# Patient Record
Sex: Female | Born: 1975 | Race: White | Hispanic: No | Marital: Single | Smoking: Never smoker
Health system: Southern US, Community
[De-identification: ages and names within clinical notes are randomized; demographics above are authoritative.]

## PROBLEM LIST (undated history)

## (undated) DIAGNOSIS — G43909 Migraine, unspecified, not intractable, without status migrainosus: Secondary | ICD-10-CM

## (undated) DIAGNOSIS — H539 Unspecified visual disturbance: Secondary | ICD-10-CM

## (undated) DIAGNOSIS — K219 Gastro-esophageal reflux disease without esophagitis: Secondary | ICD-10-CM

## (undated) DIAGNOSIS — J329 Chronic sinusitis, unspecified: Secondary | ICD-10-CM

## (undated) DIAGNOSIS — K589 Irritable bowel syndrome without diarrhea: Secondary | ICD-10-CM

## (undated) DIAGNOSIS — G35 Multiple sclerosis: Secondary | ICD-10-CM

## (undated) DIAGNOSIS — F32A Depression, unspecified: Secondary | ICD-10-CM

## (undated) DIAGNOSIS — F329 Major depressive disorder, single episode, unspecified: Secondary | ICD-10-CM

## (undated) DIAGNOSIS — G35D Multiple sclerosis, unspecified: Secondary | ICD-10-CM

## (undated) DIAGNOSIS — F419 Anxiety disorder, unspecified: Secondary | ICD-10-CM

## (undated) DIAGNOSIS — K8020 Calculus of gallbladder without cholecystitis without obstruction: Secondary | ICD-10-CM

## (undated) HISTORY — DX: Unspecified visual disturbance: H53.9

## (undated) HISTORY — PX: NO PAST SURGERIES: SHX2092

## (undated) HISTORY — PX: COLONOSCOPY: SHX174

## (undated) NOTE — *Deleted (*Deleted)
Below is our plan:  We will continue dimethyl fumerate as prescribed.   Please make sure you are staying well hydrated. I recommend 50-60 ounces daily. Well balanced diet and regular exercise encouraged.    Please continue follow up with care team as directed.   Follow up with Dr Epimenio Foot in 6 months   You may receive a survey regarding today's visit. I encourage you to leave honest feed back as I do use this information to improve patient care. Thank you for seeing me today!     Multiple Sclerosis Multiple sclerosis (MS) is a disease of the brain, spinal cord, and optic nerves (central nervous system). It causes the body's disease-fighting (immune) system to destroy the protective covering (myelin sheath) around nerves in the brain. When this happens, signals (nerve impulses) going to and from the brain and spinal cord do not get sent properly or may not get sent at all. There are several types of MS:  Relapsing-remitting MS. This is the most common type. This causes sudden attacks of symptoms. After an attack, you may recover completely until the next attack, or some symptoms may remain permanently.  Secondary progressive MS. This usually develops after the onset of relapsing-remitting MS. Similar to relapsing-remitting MS, this type also causes sudden attacks of symptoms. Attacks may be less frequent, but symptoms slowly get worse (progress) over time.  Primary progressive MS. This causes symptoms that steadily progress over time. This type of MS does not cause sudden attacks of symptoms. The age of onset of MS varies, but it often develops between 70-10 years of age. MS is a lifelong (chronic) condition. There is no cure, but treatment can help slow down the progression of the disease. What are the causes? The cause of this condition is not known. What increases the risk? You are more likely to develop this condition if:  You are a woman.  You have a relative with MS. However, the  condition is not passed from parent to child (inherited).  You have a lack (deficiency) of vitamin D.  You smoke. MS is more common in the [place] than in the [place]. What are the signs or symptoms? Relapsing-remitting and secondary progressive MS cause symptoms to occur in episodes or attacks that may last weeks to months. There may be long periods between attacks in which there are almost no symptoms. Primary progressive MS causes symptoms to steadily progress after they develop. Symptoms of MS vary because of the many different ways it affects the central nervous system. The main symptoms include:  Vision problems and eye pain.  Numbness.  Weakness.  Inability to move your arms, hands, feet, or legs (paralysis).  Balance problems.  Shaking that you cannot control (tremors).  Muscle spasms.  Problems with thinking (cognitive changes). MS can also cause symptoms that are associated with the disease, but are not always the direct result of an MS attack. They may include:  Inability to control urination or bowel movements (incontinence).  Headaches.  Fatigue.  Inability to tolerate heat.  Emotional changes.  Depression.  Pain. How is this diagnosed? This condition is diagnosed based on:  Your symptoms.  A neurological exam. This involves checking central nervous system function, such as nerve function, reflexes, and coordination.  MRIs of the brain and spinal cord.  Lab tests, including a lumbar puncture that tests the fluid that surrounds the brain and spinal cord (cerebrospinal fluid).  Tests to measure the electrical activity of the brain  in response to stimulation (evoked potentials). How is this treated? There is no cure for MS, but medicines can help decrease the number and frequency of attacks and help relieve nuisance symptoms. Treatment options may include:  Medicines that reduce the frequency of attacks. These medicines  may be given by injection, by mouth (orally), or through an IV.  Medicines that reduce inflammation (steroids). These may provide short-term relief of symptoms.  Medicines to help control pain, depression, fatigue, or incontinence.  Vitamin D, if you have a deficiency.  Using devices to help you move around (assistive devices), such as braces, a cane, or a walker.  Physical therapy to strengthen and stretch your muscles.  Occupational therapy to help you with everyday tasks.  Alternative or complementary treatments such as exercise, massage, or acupuncture. Follow these instructions at home:  Take over-the-counter and prescription medicines only as told by your health care provider.  Do not drive or use heavy machinery while taking prescription pain medicine.  Use assistive devices as recommended by your physical therapist or your health care provider.  Exercise as directed by your health care provider.  Return to your normal activities as told by your health care provider. Ask your health care provider what activities are safe for you.  Reach out for support. Share your feelings with friends, family, or a support group.  Keep all follow-up visits as told by your health care provider and therapists. This is important. Where to find more information  National Multiple Sclerosis Society: https://www.nationalmssociety.org Contact a health care provider if:  You feel depressed.  You develop new pain or numbness.  You have tremors.  You have problems with sexual function. Get help right away if:  You develop paralysis.  You develop numbness.  You have problems with your bladder or bowel function.  You develop double vision.  You lose vision in one or both eyes.  You develop suicidal thoughts.  You develop severe confusion. If you ever feel like you may hurt yourself or others, or have thoughts about taking your own life, get help right away. You can go to your  nearest emergency department or call:  Your local emergency services (911 in the [place]  A suicide crisis helpline, such as the National Suicide Prevention Lifeline at 270 625 7581. This is open 24 hours a day. Summary  Multiple sclerosis (MS) is a disease of the central nervous system that causes the body's immune system to destroy the protective covering (myelin sheath) around nerves in the brain.  There are 3 types of MS: relapsing-remitting, secondary progressive, and primary progressive. Relapsing-remitting and secondary progressive MS cause symptoms to occur in episodes or attacks that may last weeks to months. Primary progressive MS causes symptoms to steadily progress after they develop.  There is no cure for MS, but medicines can help decrease the number and frequency of attacks and help relieve nuisance symptoms. Treatment may also include physical or occupational therapy.  If you develop numbness, paralysis, vision problems, or other neurological symptoms, get help right away. This information is not intended to replace advice given to you by your health care provider. Make sure you discuss any questions you have with your health care provider. Document Revised: 08/18/2017 Document Reviewed: 11/14/2016 Elsevier Patient Education  2020 [employer].

---

## 2002-03-01 ENCOUNTER — Inpatient Hospital Stay (HOSPITAL_COMMUNITY): Admission: AD | Admit: 2002-03-01 | Discharge: 2002-03-01 | Payer: Self-pay | Admitting: Obstetrics & Gynecology

## 2002-03-05 ENCOUNTER — Inpatient Hospital Stay (HOSPITAL_COMMUNITY): Admission: AD | Admit: 2002-03-05 | Discharge: 2002-03-05 | Payer: Self-pay | Admitting: Obstetrics and Gynecology

## 2002-03-07 ENCOUNTER — Inpatient Hospital Stay (HOSPITAL_COMMUNITY): Admission: AD | Admit: 2002-03-07 | Discharge: 2002-03-09 | Payer: Self-pay | Admitting: Obstetrics and Gynecology

## 2002-04-10 ENCOUNTER — Other Ambulatory Visit: Admission: RE | Admit: 2002-04-10 | Discharge: 2002-04-10 | Payer: Self-pay | Admitting: Obstetrics and Gynecology

## 2004-05-06 ENCOUNTER — Other Ambulatory Visit: Admission: RE | Admit: 2004-05-06 | Discharge: 2004-05-06 | Payer: Self-pay | Admitting: Obstetrics and Gynecology

## 2005-07-14 ENCOUNTER — Other Ambulatory Visit: Admission: RE | Admit: 2005-07-14 | Discharge: 2005-07-14 | Payer: Self-pay | Admitting: Gynecology

## 2007-04-10 ENCOUNTER — Encounter: Admission: RE | Admit: 2007-04-10 | Discharge: 2007-04-10 | Payer: Self-pay | Admitting: Family Medicine

## 2010-10-10 ENCOUNTER — Encounter: Payer: Self-pay | Admitting: Family Medicine

## 2012-05-30 ENCOUNTER — Ambulatory Visit
Admission: RE | Admit: 2012-05-30 | Discharge: 2012-05-30 | Disposition: A | Discharge status: Home / Self Care | Payer: Managed Care, Other (non HMO) | Source: Ambulatory Visit | Attending: Family Medicine | Admitting: Family Medicine

## 2012-05-30 ENCOUNTER — Other Ambulatory Visit: Payer: Self-pay | Admitting: Family Medicine

## 2012-05-30 DIAGNOSIS — M545 Low back pain, unspecified: Secondary | ICD-10-CM

## 2012-05-30 DIAGNOSIS — R200 Anesthesia of skin: Secondary | ICD-10-CM

## 2013-06-05 ENCOUNTER — Other Ambulatory Visit: Payer: Self-pay | Admitting: Family Medicine

## 2013-06-05 DIAGNOSIS — G8929 Other chronic pain: Secondary | ICD-10-CM

## 2013-06-10 ENCOUNTER — Other Ambulatory Visit: Payer: Self-pay | Admitting: Family Medicine

## 2013-06-10 DIAGNOSIS — R1011 Right upper quadrant pain: Secondary | ICD-10-CM

## 2013-06-13 ENCOUNTER — Ambulatory Visit
Admission: RE | Admit: 2013-06-13 | Discharge: 2013-06-13 | Disposition: A | Discharge status: Home / Self Care | Payer: Managed Care, Other (non HMO) | Source: Ambulatory Visit | Attending: Family Medicine | Admitting: Family Medicine

## 2013-06-13 DIAGNOSIS — R1011 Right upper quadrant pain: Secondary | ICD-10-CM

## 2013-06-16 ENCOUNTER — Ambulatory Visit
Admission: RE | Admit: 2013-06-16 | Discharge: 2013-06-16 | Disposition: A | Discharge status: Home / Self Care | Payer: Managed Care, Other (non HMO) | Source: Ambulatory Visit | Attending: Family Medicine | Admitting: Family Medicine

## 2013-06-16 DIAGNOSIS — G8929 Other chronic pain: Secondary | ICD-10-CM

## 2013-06-16 MED ORDER — GADOBENATE DIMEGLUMINE 529 MG/ML IV SOLN
20.0000 mL | Freq: Once | INTRAVENOUS | Status: AC | PRN
  Administered 2013-06-16: 20 mL via INTRAVENOUS

## 2013-06-17 ENCOUNTER — Ambulatory Visit (INDEPENDENT_AMBULATORY_CARE_PROVIDER_SITE_OTHER): Payer: Managed Care, Other (non HMO) | Admitting: Surgery

## 2013-06-17 ENCOUNTER — Encounter (INDEPENDENT_AMBULATORY_CARE_PROVIDER_SITE_OTHER): Payer: Self-pay | Admitting: Surgery

## 2013-06-17 VITALS — BP 122/70 | HR 60 | Temp 98.2°F | Resp 14 | Ht 70.5 in | Wt 283.2 lb

## 2013-06-17 DIAGNOSIS — K8020 Calculus of gallbladder without cholecystitis without obstruction: Secondary | ICD-10-CM | POA: Insufficient documentation

## 2013-06-17 NOTE — Progress Notes (Signed)
Patient ID: Kristen Brown, female   DOB: [birth year], 37 y.o.   MRN: [id number]  Chief Complaint  Patient presents with  . New Evaluation    eval GB/gallstones    HPI Kristen Brown is a 37 y.o. female.  Patient sent at request of Dr. Caryn Bee little or upper quadrant pain. This has been intermittent and the patient is right upper quadrant. Made worse with eating fatty greasy foods. Duration is minutes to hours. Intensity severe. Associated with nausea and vomiting. Pain radiates and right chest. Pain made better by avoiding greasy foods. Ultrasound shows gallstones. HPI  History reviewed. No pertinent past medical history.  History reviewed. No pertinent past surgical history.  Family History  Problem Relation Age of Onset  . Hyperlipidemia Mother   . Hyperlipidemia Father   . Diabetes Father   . Cancer Paternal Grandfather     colon    Social History History  Substance Use Topics  . Smoking status: Never Smoker   . Smokeless tobacco: Never Used  . Alcohol Use: No    Allergies  Allergen Reactions  . Sulfa Antibiotics Hives and Itching    No current outpatient prescriptions on file.   No current facility-administered medications for this visit.    Review of Systems Review of Systems  Constitutional: Negative for fever, chills and unexpected weight change.  HENT: Negative for hearing loss, congestion, sore throat, trouble swallowing and voice change.   Eyes: Negative for visual disturbance.  Respiratory: Negative for cough and wheezing.   Cardiovascular: Negative for chest pain, palpitations and leg swelling.  Gastrointestinal: Positive for abdominal pain. Negative for nausea, vomiting, diarrhea, constipation, blood in stool, abdominal distention and anal bleeding.  Genitourinary: Negative for hematuria, vaginal bleeding and difficulty urinating.  Musculoskeletal: Negative for arthralgias.  Skin: Negative for rash and wound.  Neurological: Negative for seizures, syncope  and headaches.  Hematological: Negative for adenopathy. Does not bruise/bleed easily.  Psychiatric/Behavioral: Negative for confusion.    Blood pressure 122/70, pulse 60, temperature 98.2 F (36.8 C), temperature source Temporal, resp. rate 14, height 5' 10.5" (1.791 m), weight 283 lb 3.2 oz (128.459 kg).  Physical Exam Physical Exam  Constitutional: She is oriented to person, place, and time. She appears well-developed and well-nourished.  HENT:  Head: Normocephalic and atraumatic.  Eyes: EOM are normal. Pupils are equal, round, and reactive to light.  Neck: Normal range of motion. Neck supple.  Cardiovascular: Normal rate and regular rhythm.   Pulmonary/Chest: Effort normal and breath sounds normal.  Abdominal: Soft. Bowel sounds are normal. She exhibits no distension. There is no tenderness. There is no rebound.  Musculoskeletal: Normal range of motion.  Neurological: She is alert and oriented to person, place, and time.  Skin: Skin is warm and dry.  Psychiatric: She has a normal mood and affect. Her behavior is normal. Judgment and thought content normal.    Data Reviewed CLINICAL DATA: Right upper quadrant abdominal pain and nausea for 1  week  EXAM:  ABDOMEN ULTRASOUND  COMPARISON: None  FINDINGS:  Gallbladder  Multiple shadowing calculi within gallbladder up to at least 13 mm  in size, some present at gallbladder neck. No gallbladder wall  thickening, pericholecystic fluid or sonographic Murphy sign.  Common bile duct  Diameter: 6 mm diameter common upper normal  Liver  Borderline increased echogenicity. No focal mass lesion.  IVC  Normal appearance  Pancreas  Normal appearance  Spleen  Normal appearance, 8.0 cm length  Right Kidney  Length: 12.0 cm length.  Normal morphology without mass or  hydronephrosis.  Left Kidney  Length: 12.5 cm length. Normal morphology without mass or  hydronephrosis.  Abdominal aorta  Bifurcation obscured by bowel gas. Visualized  portion normal  caliber.  No free-fluid.  IMPRESSION:  Cholelithiasis without evidence acute cholecystitis.  Electronically Signed  By: Ulyses Southward M.D.  On: 06/13/2013 08:21   Assessment    Symptomatic cholelithiasis    Plan    Recommend laparoscopic cholecystectomy. She will call and arrange when her schedule permits. The procedure has been discussed with the patient. Operative and non operative treatments have been discussed. Risks of surgery include bleeding, infection,  Common bile duct injury,  Injury to the stomach,liver, colon,small intestine, abdominal wall,  Diaphragm,  Major blood vessels,  And the need for an open procedure.  Other risks include worsening of medical problems, death,  DVT and pulmonary embolism, and cardiovascular events.   Medical options have also been discussed. The patient has been informed of long term expectations of surgery and non surgical options,  The patient agrees to proceed.         Declin Rajan A. 06/17/2013, 1:58 PM

## 2013-06-17 NOTE — Patient Instructions (Signed)

## 2014-07-20 DIAGNOSIS — K8020 Calculus of gallbladder without cholecystitis without obstruction: Secondary | ICD-10-CM

## 2014-07-20 HISTORY — DX: Calculus of gallbladder without cholecystitis without obstruction: K80.20

## 2014-07-29 ENCOUNTER — Ambulatory Visit (INDEPENDENT_AMBULATORY_CARE_PROVIDER_SITE_OTHER): Payer: Self-pay | Admitting: Surgery

## 2014-07-29 NOTE — H&P (Signed)
Kristen Brown 07/28/2014 3:15 PM Location: [facility] Patient #: [id number] DOB: 1976-03-15 Single / Language: [place] / Race: White Female History of Present Illness Kristen Brown(Maranatha Grossi A. [name] MD; 07/29/2014 7:17 AM) Patient words: gallbaldder   intermittent sharp RUQ abdominal pain after fatty meals for 3 months. Getting worse slowly. Associated nausea and vomiting with fatty foods. Better with low fat diet.                CLINICAL DATA: Right upper quadrant abdominal pain and nausea for 1 week  EXAM: ABDOMEN ULTRASOUND  COMPARISON: None  FINDINGS: Gallbladder  Multiple shadowing calculi within gallbladder up to at least 13 mm in size, some present at gallbladder neck. No gallbladder wall thickening, pericholecystic fluid or sonographic Murphy sign.  Common bile duct  Diameter: 6 mm diameter common upper normal  Liver  Borderline increased echogenicity. No focal mass lesion.  IVC  Normal appearance  Pancreas  Normal appearance  Spleen  Normal appearance, 8.0 cm length  Right Kidney  Length: 12.0 cm length. Normal morphology without mass or hydronephrosis.  Left Kidney  Length: 12.5 cm length. Normal morphology without mass or hydronephrosis.  Abdominal aorta  Bifurcation obscured by bowel gas. Visualized portion normal caliber.  No free-fluid.  IMPRESSION: Cholelithiasis without evidence acute cholecystitis.   Electronically Signed By: Ulyses SouthwardMark Boles M.D. On: 06/13/2013 08:21.  The patient is a 38 year old female who presents for evaluation of gall stones. The onset of the gall stones has been acute and has been occurring in a persistent pattern for 3 months. The course has been gradually worsening. The gall stones is described as moderate. There has been associated abdominal pain, colicky pain and nausea. Precipitating factors include eating and excessive fat intake. Relieving factors include dietary changes. Other  Problems Kristen Brown([name], CMA; 07/28/2014 3:17 PM) Anxiety Disorder Cholelithiasis Depression Gastroesophageal Reflux Disease Hemorrhoids Melanoma Migraine Headache  Past Surgical History Kristen Brown([name], CMA; 07/28/2014 3:17 PM) Oral Surgery  Diagnostic Studies History Kristen Brown([name], CMA; 07/28/2014 3:17 PM) Colonoscopy never Mammogram never Pap Smear 1-5 years ago  Allergies Kristen Brown([name], CMA; 07/28/2014 3:18 PM) Sulfa Antibiotics  Medication History ([name], CMA; 07/28/2014 3:19 PM) LORazepam (0.5MG  Tablet, Oral) Active. FLUoxetine HCl (20MG  Tablet, Oral) Active. TriNessa (28) (0.18/0.215/0.25MG -35 MCG Tablet, Oral) Active. Tecfidera (240MG  Capsule DR, Oral) Active. Gabapentin (100MG  Capsule, Oral) Active.  Social History Kristen Brown([name], CMA; 07/28/2014 3:17 PM) Caffeine use Carbonated beverages, Coffee. No alcohol use No drug use Tobacco use Never smoker.  Family History Kristen Brown([name], CMA; 07/28/2014 3:17 PM) Arthritis Father, Mother. Melanoma Father. Migraine Headache Mother.  Pregnancy / Birth History Kristen Brown([name], CMA; 07/28/2014 3:17 PM) Age at menarche 9 years. Contraceptive History Oral contraceptives. Gravida 1 Maternal age 39-25 Para 1 Regular periods     Review of Systems [name]([name] CMA; 07/28/2014 3:17 PM) General Present- Fatigue. Not Present- Appetite Loss, Chills, Fever, Night Sweats, Weight Gain and Weight Loss. HEENT Present- Seasonal Allergies and Sinus Pain. Not Present- Earache, Hearing Loss, Hoarseness, Nose Bleed, Oral Ulcers, Ringing in the Ears, Sore Throat, Visual Disturbances, Wears glasses/contact lenses and Yellow Eyes. Respiratory Not Present- Bloody sputum, Chronic Cough, Difficulty Breathing, Snoring and Wheezing. Breast Not Present- Breast Mass, Breast Pain, Nipple Discharge and Skin Changes. Cardiovascular Not Present- Chest Pain, Difficulty Breathing Lying Down, Leg Cramps, Palpitations, Rapid Heart Rate,  Shortness of Breath and Swelling of Extremities. Gastrointestinal Not Present- Abdominal Pain, Bloating, Bloody Stool, Change in Bowel Habits, Chronic diarrhea, Constipation, Difficulty Swallowing, Excessive gas,  Gets full quickly at meals, Hemorrhoids, Indigestion, Nausea, Rectal Pain and Vomiting. Female Genitourinary Not Present- Frequency, Nocturia, Painful Urination, Pelvic Pain and Urgency. Musculoskeletal Present- Muscle Weakness. Not Present- Back Pain, Joint Pain, Joint Stiffness, Muscle Pain and Swelling of Extremities. Neurological Present- Decreased Memory, Tremor and Weakness. Not Present- Fainting, Headaches, Numbness, Seizures, Tingling and Trouble walking. Psychiatric Present- Anxiety. Not Present- Bipolar, Change in Sleep Pattern, Depression, Fearful and Frequent crying. Endocrine Present- Heat Intolerance. Not Present- Cold Intolerance, Excessive Hunger, Hair Changes, Hot flashes and New Diabetes. Hematology Present- Easy Bruising. Not Present- Excessive bleeding, Gland problems, HIV and Persistent Infections.  Vitals ([name] CMA; 07/28/2014 3:20 PM) 07/28/2014 3:19 PM Weight: 289 lb Height: 71in Body Surface Area: 2.56 m Body Mass Index: 40.31 kg/m Temp.: 98.66F(Temporal)  Pulse: 78 (Regular)  BP: 130/76 (Sitting, Left Arm, Standard)     Physical Exam ([name] A. [name] MD; 07/28/2014 9:21 PM)  General Mental Status-Alert. General Appearance-Consistent with stated age. Hydration-Well hydrated. Voice-Normal.  Head and Neck Head-normocephalic, atraumatic with no lesions or palpable masses.  Eye Eyeball - Bilateral-Extraocular movements intact. Sclera/Conjunctiva - Bilateral-No scleral icterus.  Chest and Lung Exam Chest and lung exam reveals -quiet, even and easy respiratory effort with no use of accessory muscles and on auscultation, normal breath sounds, no adventitious sounds and normal vocal resonance. Inspection Chest Wall -  Normal. Back - normal.  Cardiovascular Cardiovascular examination reveals -on palpation PMI is normal in location and amplitude, no palpable S3 or S4. Normal cardiac borders., normal heart sounds, regular rate and rhythm with no murmurs, carotid auscultation reveals no bruits and normal pedal pulses bilaterally.  Abdomen Inspection Inspection of the abdomen reveals - No Hernias. Skin - Scar - no surgical scars. Palpation/Percussion Palpation and Percussion of the abdomen reveal - Soft, Non Tender, No Rebound tenderness, No Rigidity (guarding) and No hepatosplenomegaly. Auscultation Auscultation of the abdomen reveals - Bowel sounds normal.  Neurologic Neurologic evaluation reveals -alert and oriented x 3 with no impairment of recent or remote memory. Mental Status-Normal.  Musculoskeletal Normal Exam - Left-Upper Extremity Strength Normal and Lower Extremity Strength Normal. Normal Exam - Right-Upper Extremity Strength Normal, Lower Extremity Weakness.    Assessment & Plan (Waldo Damian A. Hatem Cull MD; 07/28/2014 9:23 PM)  SYMPTOMATIC CHOLELITHIASIS (574.20  K80.20) Impression: The procedure has been discussed with the patient. Risks of laparoscopic cholecystectomy include bleeding, infection, bile duct injury, leak, death, open surgery, diarrhea, other surgery, organ injury, blood vessel injury, DVT, and additional care.  RECOMMEND LAPROSCOPIC CHOLECYSTECTOMY AND CHOLANGIOGRAM.  Current Plans Pt Education - Cholecystectomy: Gallbladder Removal with Open Surgery: laparoscopic cholecystectomy

## 2014-08-11 ENCOUNTER — Encounter (HOSPITAL_BASED_OUTPATIENT_CLINIC_OR_DEPARTMENT_OTHER): Payer: Self-pay | Admitting: *Deleted

## 2014-08-11 DIAGNOSIS — J329 Chronic sinusitis, unspecified: Secondary | ICD-10-CM

## 2014-08-11 HISTORY — DX: Chronic sinusitis, unspecified: J32.9

## 2014-08-11 NOTE — Pre-Procedure Instructions (Signed)
To come for CBC, diff, CMET 

## 2014-08-12 ENCOUNTER — Encounter (HOSPITAL_BASED_OUTPATIENT_CLINIC_OR_DEPARTMENT_OTHER)
Admission: RE | Admit: 2014-08-12 | Discharge: 2014-08-12 | Disposition: A | Discharge status: Home / Self Care | Payer: Managed Care, Other (non HMO) | Source: Ambulatory Visit | Attending: Surgery | Admitting: Surgery

## 2014-08-12 DIAGNOSIS — Z01812 Encounter for preprocedural laboratory examination: Secondary | ICD-10-CM | POA: Diagnosis not present

## 2014-08-12 DIAGNOSIS — F418 Other specified anxiety disorders: Secondary | ICD-10-CM | POA: Diagnosis not present

## 2014-08-12 DIAGNOSIS — Z8261 Family history of arthritis: Secondary | ICD-10-CM | POA: Diagnosis not present

## 2014-08-12 DIAGNOSIS — J302 Other seasonal allergic rhinitis: Secondary | ICD-10-CM | POA: Diagnosis not present

## 2014-08-12 DIAGNOSIS — Z809 Family history of malignant neoplasm, unspecified: Secondary | ICD-10-CM | POA: Diagnosis not present

## 2014-08-12 DIAGNOSIS — Z6841 Body Mass Index (BMI) 40.0 and over, adult: Secondary | ICD-10-CM | POA: Diagnosis not present

## 2014-08-12 DIAGNOSIS — Z882 Allergy status to sulfonamides status: Secondary | ICD-10-CM | POA: Diagnosis not present

## 2014-08-12 DIAGNOSIS — K219 Gastro-esophageal reflux disease without esophagitis: Secondary | ICD-10-CM | POA: Diagnosis not present

## 2014-08-12 DIAGNOSIS — K649 Unspecified hemorrhoids: Secondary | ICD-10-CM | POA: Diagnosis not present

## 2014-08-12 DIAGNOSIS — K8010 Calculus of gallbladder with chronic cholecystitis without obstruction: Secondary | ICD-10-CM | POA: Diagnosis not present

## 2014-08-12 DIAGNOSIS — Z85820 Personal history of malignant melanoma of skin: Secondary | ICD-10-CM | POA: Diagnosis not present

## 2014-08-12 DIAGNOSIS — G43909 Migraine, unspecified, not intractable, without status migrainosus: Secondary | ICD-10-CM | POA: Diagnosis not present

## 2014-08-12 LAB — COMPREHENSIVE METABOLIC PANEL
ALT: 17 U/L (ref 0–35)
AST: 18 U/L (ref 0–37)
Albumin: 3.7 g/dL (ref 3.5–5.2)
Alkaline Phosphatase: 76 U/L (ref 39–117)
Anion gap: 11 (ref 5–15)
BUN: 14 mg/dL (ref 6–23)
CO2: 27 mEq/L (ref 19–32)
Calcium: 9.2 mg/dL (ref 8.4–10.5)
Chloride: 100 mEq/L (ref 96–112)
Creatinine, Ser: 0.56 mg/dL (ref 0.50–1.10)
GFR calc Af Amer: >90 mL/min (ref >90)
GFR calc non Af Amer: >90 mL/min (ref >90)
Glucose, Bld: 76 mg/dL (ref 70–99)
Potassium: 4.8 mEq/L (ref 3.7–5.3)
Sodium: 138 mEq/L (ref 137–147)
Total Bilirubin: 0.3 mg/dL (ref 0.3–1.2)
Total Protein: 7.3 g/dL (ref 6.0–8.3)

## 2014-08-12 LAB — CBC WITH DIFFERENTIAL/PLATELET
Basophils Absolute: 0 10*3/uL (ref 0.0–0.1)
Basophils Relative: 0 % (ref 0–1)
Eosinophils Absolute: 0.3 10*3/uL (ref 0.0–0.7)
Eosinophils Relative: 4 % (ref 0–5)
HCT: 39.4 % (ref 36.0–46.0)
Hemoglobin: 12.6 g/dL (ref 12.0–15.0)
Lymphocytes Relative: 23 % (ref 12–46)
Lymphs Abs: 1.7 10*3/uL (ref 0.7–4.0)
MCH: 28.5 pg (ref 26.0–34.0)
MCHC: 32 g/dL (ref 30.0–36.0)
MCV: 89.1 fL (ref 78.0–100.0)
Monocytes Absolute: 0.6 10*3/uL (ref 0.1–1.0)
Monocytes Relative: 8 % (ref 3–12)
Neutro Abs: 4.8 10*3/uL (ref 1.7–7.7)
Neutrophils Relative %: 65 % (ref 43–77)
Platelets: 181 10*3/uL (ref 150–400)
RBC: 4.42 MIL/uL (ref 3.87–5.11)
RDW: 13 % (ref 11.5–15.5)
WBC: 7.4 10*3/uL (ref 4.0–10.5)

## 2014-08-19 ENCOUNTER — Ambulatory Visit (HOSPITAL_BASED_OUTPATIENT_CLINIC_OR_DEPARTMENT_OTHER)
Admission: RE | Admit: 2014-08-19 | Discharge: 2014-08-19 | Disposition: A | Discharge status: Home / Self Care | Payer: Managed Care, Other (non HMO) | Source: Ambulatory Visit | Attending: Surgery | Admitting: Surgery

## 2014-08-19 ENCOUNTER — Encounter (HOSPITAL_BASED_OUTPATIENT_CLINIC_OR_DEPARTMENT_OTHER)
Admission: RE | Disposition: A | Discharge status: Home / Self Care | Payer: Self-pay | Source: Ambulatory Visit | Attending: Surgery

## 2014-08-19 ENCOUNTER — Encounter (HOSPITAL_BASED_OUTPATIENT_CLINIC_OR_DEPARTMENT_OTHER): Payer: Self-pay | Admitting: Anesthesiology

## 2014-08-19 ENCOUNTER — Ambulatory Visit (HOSPITAL_BASED_OUTPATIENT_CLINIC_OR_DEPARTMENT_OTHER): Payer: Managed Care, Other (non HMO) | Admitting: Anesthesiology

## 2014-08-19 ENCOUNTER — Ambulatory Visit (HOSPITAL_COMMUNITY): Payer: Managed Care, Other (non HMO)

## 2014-08-19 DIAGNOSIS — K219 Gastro-esophageal reflux disease without esophagitis: Secondary | ICD-10-CM | POA: Insufficient documentation

## 2014-08-19 DIAGNOSIS — Z882 Allergy status to sulfonamides status: Secondary | ICD-10-CM | POA: Insufficient documentation

## 2014-08-19 DIAGNOSIS — G43909 Migraine, unspecified, not intractable, without status migrainosus: Secondary | ICD-10-CM | POA: Insufficient documentation

## 2014-08-19 DIAGNOSIS — K8010 Calculus of gallbladder with chronic cholecystitis without obstruction: Secondary | ICD-10-CM | POA: Diagnosis not present

## 2014-08-19 DIAGNOSIS — Z6841 Body Mass Index (BMI) 40.0 and over, adult: Secondary | ICD-10-CM | POA: Insufficient documentation

## 2014-08-19 DIAGNOSIS — Z8261 Family history of arthritis: Secondary | ICD-10-CM | POA: Insufficient documentation

## 2014-08-19 DIAGNOSIS — J302 Other seasonal allergic rhinitis: Secondary | ICD-10-CM | POA: Insufficient documentation

## 2014-08-19 DIAGNOSIS — F418 Other specified anxiety disorders: Secondary | ICD-10-CM | POA: Insufficient documentation

## 2014-08-19 DIAGNOSIS — Z809 Family history of malignant neoplasm, unspecified: Secondary | ICD-10-CM | POA: Insufficient documentation

## 2014-08-19 DIAGNOSIS — Z85820 Personal history of malignant melanoma of skin: Secondary | ICD-10-CM | POA: Insufficient documentation

## 2014-08-19 DIAGNOSIS — K649 Unspecified hemorrhoids: Secondary | ICD-10-CM | POA: Insufficient documentation

## 2014-08-19 DIAGNOSIS — K8020 Calculus of gallbladder without cholecystitis without obstruction: Secondary | ICD-10-CM

## 2014-08-19 HISTORY — DX: Chronic sinusitis, unspecified: J32.9

## 2014-08-19 HISTORY — DX: Multiple sclerosis, unspecified: G35.D

## 2014-08-19 HISTORY — DX: Irritable bowel syndrome, unspecified: K58.9

## 2014-08-19 HISTORY — DX: Depression, unspecified: F32.A

## 2014-08-19 HISTORY — DX: Gastro-esophageal reflux disease without esophagitis: K21.9

## 2014-08-19 HISTORY — DX: Anxiety disorder, unspecified: F41.9

## 2014-08-19 HISTORY — DX: Migraine, unspecified, not intractable, without status migrainosus: G43.909

## 2014-08-19 HISTORY — DX: Calculus of gallbladder without cholecystitis without obstruction: K80.20

## 2014-08-19 HISTORY — DX: Multiple sclerosis: G35

## 2014-08-19 HISTORY — DX: Major depressive disorder, single episode, unspecified: F32.9

## 2014-08-19 HISTORY — PX: CHOLECYSTECTOMY: SHX55

## 2014-08-19 LAB — POCT HEMOGLOBIN-HEMACUE: Hemoglobin: 13.2 g/dL (ref 12.0–15.0)

## 2014-08-19 SURGERY — LAPAROSCOPIC CHOLECYSTECTOMY WITH INTRAOPERATIVE CHOLANGIOGRAM
Anesthesia: General | Site: Abdomen

## 2014-08-19 MED ORDER — HYDROMORPHONE HCL 1 MG/ML IJ SOLN: 0.2500 mg | INTRAMUSCULAR | Status: DC | PRN

## 2014-08-19 MED ORDER — LIDOCAINE HCL (CARDIAC) 20 MG/ML IV SOLN
INTRAVENOUS | Status: DC | PRN
  Administered 2014-08-19: 100 mg via INTRAVENOUS

## 2014-08-19 MED ORDER — MIDAZOLAM HCL 2 MG/2ML IJ SOLN
INTRAMUSCULAR | Status: AC
  Filled 2014-08-19: qty 2

## 2014-08-19 MED ORDER — HYDROMORPHONE HCL 1 MG/ML IJ SOLN
0.2500 mg | INTRAMUSCULAR | Status: DC | PRN
  Administered 2014-08-19 (×3): 0.5 mg via INTRAVENOUS

## 2014-08-19 MED ORDER — FENTANYL CITRATE 0.05 MG/ML IJ SOLN: 50.0000 ug | INTRAMUSCULAR | Status: DC | PRN

## 2014-08-19 MED ORDER — CHLORHEXIDINE GLUCONATE 4 % EX LIQD: 1.0000 "application " | Freq: Once | CUTANEOUS | Status: DC

## 2014-08-19 MED ORDER — GLYCOPYRROLATE 0.2 MG/ML IJ SOLN
INTRAMUSCULAR | Status: DC | PRN
  Administered 2014-08-19: 0.4 mg via INTRAVENOUS

## 2014-08-19 MED ORDER — HYDROMORPHONE HCL 1 MG/ML IJ SOLN
INTRAMUSCULAR | Status: AC
  Filled 2014-08-19: qty 1

## 2014-08-19 MED ORDER — MIDAZOLAM HCL 2 MG/ML PO SYRP: 12.0000 mg | ORAL_SOLUTION | Freq: Once | ORAL | Status: DC | PRN

## 2014-08-19 MED ORDER — BUPIVACAINE-EPINEPHRINE 0.25% -1:200000 IJ SOLN
INTRAMUSCULAR | Status: DC | PRN
  Administered 2014-08-19: 6 mL

## 2014-08-19 MED ORDER — OXYCODONE HCL 5 MG PO TABS: 5.0000 mg | ORAL_TABLET | Freq: Once | ORAL | Status: DC | PRN

## 2014-08-19 MED ORDER — DEXAMETHASONE SODIUM PHOSPHATE 4 MG/ML IJ SOLN
INTRAMUSCULAR | Status: DC | PRN
  Administered 2014-08-19: 10 mg via INTRAVENOUS

## 2014-08-19 MED ORDER — SODIUM CHLORIDE 0.9 % IJ SOLN
INTRAMUSCULAR | Status: AC
  Filled 2014-08-19: qty 50

## 2014-08-19 MED ORDER — NEOSTIGMINE METHYLSULFATE 10 MG/10ML IV SOLN
INTRAVENOUS | Status: DC | PRN
  Administered 2014-08-19: 3 mg via INTRAVENOUS

## 2014-08-19 MED ORDER — MEPERIDINE HCL 25 MG/ML IJ SOLN: 6.2500 mg | INTRAMUSCULAR | Status: DC | PRN

## 2014-08-19 MED ORDER — MIDAZOLAM HCL 5 MG/5ML IJ SOLN
INTRAMUSCULAR | Status: DC | PRN
  Administered 2014-08-19: 2 mg via INTRAVENOUS

## 2014-08-19 MED ORDER — FENTANYL CITRATE 0.05 MG/ML IJ SOLN
INTRAMUSCULAR | Status: AC
  Filled 2014-08-19: qty 6

## 2014-08-19 MED ORDER — LACTATED RINGERS IV SOLN
INTRAVENOUS | Status: DC
  Administered 2014-08-19 (×2): via INTRAVENOUS

## 2014-08-19 MED ORDER — ROCURONIUM BROMIDE 100 MG/10ML IV SOLN
INTRAVENOUS | Status: DC | PRN
  Administered 2014-08-19: 40 mg via INTRAVENOUS
  Administered 2014-08-19: 10 mg via INTRAVENOUS

## 2014-08-19 MED ORDER — BUPIVACAINE-EPINEPHRINE (PF) 0.25% -1:200000 IJ SOLN
INTRAMUSCULAR | Status: AC
  Filled 2014-08-19: qty 30

## 2014-08-19 MED ORDER — SODIUM CHLORIDE 0.9 % IR SOLN
Status: DC | PRN
  Administered 2014-08-19: 1000 mL

## 2014-08-19 MED ORDER — ONDANSETRON HCL 4 MG/2ML IJ SOLN
4.0000 mg | Freq: Once | INTRAMUSCULAR | Status: AC | PRN
  Administered 2014-08-19: 4 mg via INTRAVENOUS

## 2014-08-19 MED ORDER — ONDANSETRON HCL 4 MG/2ML IJ SOLN
INTRAMUSCULAR | Status: AC
  Filled 2014-08-19: qty 2

## 2014-08-19 MED ORDER — OXYCODONE HCL 5 MG/5ML PO SOLN: 5.0000 mg | Freq: Once | ORAL | Status: DC | PRN

## 2014-08-19 MED ORDER — IOHEXOL 300 MG/ML  SOLN
INTRAMUSCULAR | Status: DC | PRN
  Administered 2014-08-19: 20 mL

## 2014-08-19 MED ORDER — PROPOFOL 10 MG/ML IV BOLUS
INTRAVENOUS | Status: AC
  Filled 2014-08-19: qty 20

## 2014-08-19 MED ORDER — ONDANSETRON HCL 4 MG/2ML IJ SOLN
INTRAMUSCULAR | Status: DC | PRN
  Administered 2014-08-19: 4 mg via INTRAVENOUS

## 2014-08-19 MED ORDER — FENTANYL CITRATE 0.05 MG/ML IJ SOLN
INTRAMUSCULAR | Status: DC | PRN
  Administered 2014-08-19 (×2): 50 ug via INTRAVENOUS
  Administered 2014-08-19: 100 ug via INTRAVENOUS
  Administered 2014-08-19 (×2): 50 ug via INTRAVENOUS

## 2014-08-19 MED ORDER — ONDANSETRON HCL 4 MG/2ML IJ SOLN: 4.0000 mg | Freq: Once | INTRAMUSCULAR | Status: DC | PRN

## 2014-08-19 MED ORDER — OXYCODONE-ACETAMINOPHEN 5-325 MG PO TABS: 1.0000 | ORAL_TABLET | ORAL | Status: DC | PRN

## 2014-08-19 MED ORDER — CEFAZOLIN SODIUM 10 G IJ SOLR
3.0000 g | INTRAMUSCULAR | Status: AC
  Administered 2014-08-19: 3 g via INTRAVENOUS

## 2014-08-19 MED ORDER — MIDAZOLAM HCL 2 MG/2ML IJ SOLN: 1.0000 mg | INTRAMUSCULAR | Status: DC | PRN

## 2014-08-19 MED ORDER — CEFAZOLIN SODIUM-DEXTROSE 2-3 GM-% IV SOLR
INTRAVENOUS | Status: AC
  Filled 2014-08-19: qty 50

## 2014-08-19 MED ORDER — PROPOFOL 10 MG/ML IV BOLUS
INTRAVENOUS | Status: DC | PRN
  Administered 2014-08-19: 170 mg via INTRAVENOUS

## 2014-08-19 MED ORDER — CEFAZOLIN SODIUM 1-5 GM-% IV SOLN
INTRAVENOUS | Status: AC
  Filled 2014-08-19: qty 50

## 2014-08-19 SURGICAL SUPPLY — 43 items
APPLIER CLIP ROT 10 11.4 M/L (STAPLE) ×2
APR CLP MED LRG 11.4X10 (STAPLE) ×1
BAG SPEC RTRVL LRG 6X4 10 (ENDOMECHANICALS) ×1
BLADE CLIPPER SURG (BLADE) IMPLANT
CANISTER SUCT 1200ML W/VALVE (MISCELLANEOUS) ×2 IMPLANT
CHLORAPREP W/TINT 26ML (MISCELLANEOUS) ×2 IMPLANT
CLIP APPLIE ROT 10 11.4 M/L (STAPLE) ×1 IMPLANT
COVER MAYO STAND STRL (DRAPES) ×2 IMPLANT
DECANTER SPIKE VIAL GLASS SM (MISCELLANEOUS) IMPLANT
DRAPE C-ARM 42X72 X-RAY (DRAPES) ×2 IMPLANT
ELECT REM PT RETURN 9FT ADLT (ELECTROSURGICAL) ×2
ELECTRODE REM PT RTRN 9FT ADLT (ELECTROSURGICAL) ×1 IMPLANT
FILTER SMOKE EVAC LAPAROSHD (FILTER) IMPLANT
GLOVE BIO SURGEON STRL SZ8 (GLOVE) ×1 IMPLANT
GLOVE BIOGEL PI IND STRL 7.0 (GLOVE) IMPLANT
GLOVE BIOGEL PI IND STRL 8 (GLOVE) ×1 IMPLANT
GLOVE BIOGEL PI INDICATOR 7.0 (GLOVE) ×2
GLOVE BIOGEL PI INDICATOR 8 (GLOVE) ×1
GLOVE SURG SS PI 7.0 STRL IVOR (GLOVE) ×2 IMPLANT
GLOVE SURG SS PI 8.0 STRL IVOR (GLOVE) ×1 IMPLANT
GOWN STRL REUS W/ TWL LRG LVL3 (GOWN DISPOSABLE) ×3 IMPLANT
GOWN STRL REUS W/TWL LRG LVL3 (GOWN DISPOSABLE) ×6
HEMOSTAT SNOW SURGICEL 2X4 (HEMOSTASIS) ×2 IMPLANT
LINER CANISTER 1000CC FLEX (MISCELLANEOUS) ×2 IMPLANT
LIQUID BAND (GAUZE/BANDAGES/DRESSINGS) ×2 IMPLANT
NS IRRIG 1000ML POUR BTL (IV SOLUTION) IMPLANT
PACK BASIN DAY SURGERY FS (CUSTOM PROCEDURE TRAY) ×2 IMPLANT
POUCH SPECIMEN RETRIEVAL 10MM (ENDOMECHANICALS) ×2 IMPLANT
SCISSORS LAP 5X35 DISP (ENDOMECHANICALS) ×1 IMPLANT
SET CHOLANGIOGRAPH 5 50 .035 (SET/KITS/TRAYS/PACK) ×2 IMPLANT
SET IRRIG TUBING LAPAROSCOPIC (IRRIGATION / IRRIGATOR) ×2 IMPLANT
SLEEVE ENDOPATH XCEL 5M (ENDOMECHANICALS) ×2 IMPLANT
SLEEVE SCD COMPRESS KNEE MED (MISCELLANEOUS) ×2 IMPLANT
STRIP CLOSURE SKIN 1/2X4 (GAUZE/BANDAGES/DRESSINGS) IMPLANT
SUT MNCRL AB 4-0 PS2 18 (SUTURE) ×2 IMPLANT
SUT VICRYL 0 UR6 27IN ABS (SUTURE) IMPLANT
TOWEL OR 17X24 6PK STRL BLUE (TOWEL DISPOSABLE) ×2 IMPLANT
TRAY LAPAROSCOPIC (CUSTOM PROCEDURE TRAY) ×2 IMPLANT
TROCAR XCEL BLUNT TIP 100MML (ENDOMECHANICALS) ×2 IMPLANT
TROCAR XCEL NON-BLD 11X100MML (ENDOMECHANICALS) ×2 IMPLANT
TROCAR XCEL NON-BLD 5MMX100MML (ENDOMECHANICALS) ×2 IMPLANT
TUBE CONNECTING 20X1/4 (TUBING) ×2 IMPLANT
TUBING INSUFFLATION (TUBING) ×2 IMPLANT

## 2014-08-19 NOTE — Anesthesia Procedure Notes (Signed)
Procedure Name: Intubation Date/Time: 08/19/2014 12:15 PM Performed by: Burna Cash Pre-anesthesia Checklist: Patient identified, Emergency Drugs available, Suction available and Patient being monitored Patient Re-evaluated:Patient Re-evaluated prior to inductionOxygen Delivery Method: Circle System Utilized Preoxygenation: Pre-oxygenation with 100% oxygen Intubation Type: IV induction Ventilation: Mask ventilation without difficulty Laryngoscope Size: Mac and 3 Grade View: Grade I Tube type: Oral Number of attempts: 1 Airway Equipment and Method: stylet and oral airway Placement Confirmation: ETT inserted through vocal cords under direct vision,  positive ETCO2 and breath sounds checked- equal and bilateral Secured at: 20 cm Tube secured with: Tape Dental Injury: Teeth and Oropharynx as per pre-operative assessment

## 2014-08-19 NOTE — Anesthesia Postprocedure Evaluation (Signed)
Anesthesia Post Note  Patient: Kristen Brown  Procedure(s) Performed: Procedure(s) (LRB): LAPAROSCOPIC CHOLECYSTECTOMY WITH INTRAOPERATIVE CHOLANGIOGRAM (N/A)  Anesthesia type: general  Patient location: PACU  Post pain: Pain level controlled  Post assessment: Patient's Cardiovascular Status Stable  Last Vitals:  Filed Vitals:   08/19/14 1430  BP: 112/59  Pulse: 63  Temp:   Resp: 18    Post vital signs: Reviewed and stable  Level of consciousness: sedated  Complications: No apparent anesthesia complications

## 2014-08-19 NOTE — Interval H&P Note (Signed)
History and Physical Interval Note:  08/19/2014 11:38 AM  Kristen Brown  has presented today for surgery, with the diagnosis of Gallstones  The various methods of treatment have been discussed with the patient and family. After consideration of risks, benefits and other options for treatment, the patient has consented to  Procedure(s): LAPAROSCOPIC CHOLECYSTECTOMY WITH INTRAOPERATIVE CHOLANGIOGRAM (N/A) as a surgical intervention .  The patient's history has been reviewed, patient examined, no change in status, stable for surgery.  I have reviewed the patient's chart and labs.  Questions were answered to the patient's satisfaction.     Yashira Offenberger A.

## 2014-08-19 NOTE — Transfer of Care (Signed)
Immediate Anesthesia Transfer of Care Note  Patient: Kristen Brown  Procedure(s) Performed: Procedure(s): LAPAROSCOPIC CHOLECYSTECTOMY WITH INTRAOPERATIVE CHOLANGIOGRAM (N/A)  Patient Location: PACU  Anesthesia Type:General  Level of Consciousness: awake, alert  and oriented  Airway & Oxygen Therapy: Patient Spontanous Breathing and Patient connected to face mask oxygen  Post-op Assessment: Report given to PACU RN and Post -op Vital signs reviewed and stable  Post vital signs: Reviewed and stable  Complications: No apparent anesthesia complications

## 2014-08-19 NOTE — Op Note (Signed)
Laparoscopic Cholecystectomy with attempted  IOC Procedure Note  Indications: This patient presents with symptomatic gallbladder disease and will undergo laparoscopic cholecystectomy.The procedure has been discussed with the patient. Operative and non operative treatments have been discussed. Risks of surgery include bleeding, infection,  Common bile duct injury,  Injury to the stomach,liver, colon,small intestine, abdominal wall,  Diaphragm,  Major blood vessels,  And the need for an open procedure.  Other risks include worsening of medical problems, death,  DVT and pulmonary embolism, and cardiovascular events.   Medical options have also been discussed. The patient has been informed of long term expectations of surgery and non surgical options,  The patient agrees to proceed.    Pre-operative Diagnosis: Calculus of gallbladder without mention of cholecystitis or obstruction  Post-operative Diagnosis: Same  Surgeon: Stesha Neyens A.   Assistants: or staff  Anesthesia: General endotracheal anesthesia and Local anesthesia 0.25.% bupivacaine, with epinephrine  ASA Class: 2  Procedure Details  The patient was seen again in the Holding Room. The risks, benefits, complications, treatment options, and expected outcomes were discussed with the patient. The possibilities of reaction to medication, pulmonary aspiration, perforation of viscus, bleeding, recurrent infection, finding a normal gallbladder, the need for additional procedures, failure to diagnose a condition, the possible need to convert to an open procedure, and creating a complication requiring transfusion or operation were discussed with the patient. The patient and/or family concurred with the proposed plan, giving informed consent. The site of surgery properly noted/marked. The patient was taken to Operating Room, identified as Kristen Brown and the procedure verified as Laparoscopic Cholecystectomy with Intraoperative Cholangiograms. A  Time Out was held and the above information confirmed.  Prior to the induction of general anesthesia, antibiotic prophylaxis was administered. General endotracheal anesthesia was then administered and tolerated well. After the induction, the abdomen was prepped in the usual sterile fashion. The patient was positioned in the supine position with the left arm comfortably tucked, along with some reverse Trendelenburg.  Local anesthetic agent was injected into the skin near the umbilicus and an incision made. The midline fascia was incised and the Hasson technique was used to introduce a 12 mm port under direct vision. It was secured with a figure of eight Vicryl suture placed in the usual fashion. Pneumoperitoneum was then created with CO2 and tolerated well without any adverse changes in the patient's vital signs. Additional trocars were introduced under direct vision with an 11 mm trocar in the epigastrium and 2 5 mm trocars in the right upper quadrant. All skin incisions were infiltrated with a local anesthetic agent before making the incision and placing the trocars.   The gallbladder was identified, the fundus grasped and retracted cephalad. Adhesions were lysed bluntly and with the electrocautery where indicated, taking care not to injure any adjacent organs or viscus. The infundibulum was grasped and retracted laterally, exposing the peritoneum overlying the triangle of Calot. This was then divided and exposed in a blunt fashion. The cystic duct was clearly identified and bluntly dissected circumferentially. The junctions of the gallbladder, cystic duct and common bile duct were clearly identified prior to the division of any linear structure.   An incision was made in the cystic duct and the cholangiogram catheter introduced. Multiple attempts were made to shoot the cholangiogram but could not get a clip to seal around th catheter very well and contrast kept leaking around.  I decided to forgo the IOC  so no  Injury to the duct would occur.  I  could see the CBD and we were well away. The cystic duct was then  ligated with surgical clips  on the patient side and  clipped on the gallbladder side and divided. The cystic artery was identified, dissected free, ligated with clips and divided as well. Posterior cystic artery clipped and divided.  The gallbladder was dissected from the liver bed in retrograde fashion with the electrocautery. The gallbladder was removed. The liver bed was irrigated and inspected. Hemostasis was achieved with the electrocautery. Copious irrigation was utilized and was repeatedly aspirated until clear all particulate matter. Hemostasis was achieved with no signs  Of bleeding or bile leakage.  Pneumoperitoneum was completely reduced after viewing removal of the trocars under direct vision. The wound was thoroughly irrigated and the fascia was then closed with a figure of eight suture; the skin was then closed with 4 0  and a sterile dressing was applied.  Instrument, sponge, and needle counts were correct at closure and at the conclusion of the case.   Findings:  Cholelithiasis  Estimated Blood Loss: Minimal         Drains: NONE         Total IV Fluids: 800 mL         Specimens: Gallbladder           Complications: None; patient tolerated the procedure well.         Disposition: PACU - hemodynamically stable.         Condition: stable

## 2014-08-19 NOTE — Discharge Instructions (Signed)
Post Anesthesia Home Care Instructions  Activity: Get plenty of rest for the remainder of the day. A responsible adult should stay with you for 24 hours following the procedure.  For the next 24 hours, DO NOT: -Drive a car -Advertising copywriter -Drink alcoholic beverages -Take any medication unless instructed by your physician -Make any legal decisions or sign important papers.  Meals: Start with liquid foods such as gelatin or soup. Progress to regular foods as tolerated. Avoid greasy, spicy, heavy foods. If nausea and/or vomiting occur, drink only clear liquids until the nausea and/or vomiting subsides. Call your physician if vomiting continues.  Special Instructions/Symptoms: Your throat may feel dry or sore from the anesthesia or the breathing tube placed in your throat during surgery. If this causes discomfort, gargle with warm salt water. The discomfort should disappear within 24 hours. [facility] ______[facility] LAPAROSCOPIC SURGERY: POST OP INSTRUCTIONS Always review your discharge instruction sheet given to you by the facility where your surgery was performed. IF YOU HAVE DISABILITY OR FAMILY LEAVE FORMS, YOU MUST BRING THEM TO THE OFFICE FOR PROCESSING.   DO NOT GIVE THEM TO YOUR DOCTOR.  1. A prescription for pain medication may be given to you upon discharge.  Take your pain medication as prescribed, if needed.  If narcotic pain medicine is not needed, then you may take acetaminophen (Tylenol) or ibuprofen (Advil) as needed. 2. Take your usually prescribed medications unless otherwise directed. 3. If you need a refill on your pain medication, please contact your pharmacy.  They will contact our office to request authorization. Prescriptions will not be filled after 5pm or on week-ends. 4. You should follow a light diet the first few days after arrival home, such as soup and crackers, etc.  Be sure to include lots of fluids daily. 5. Most patients will experience  some swelling and bruising in the area of the incisions.  Ice packs will help.  Swelling and bruising can take several days to resolve.  6. It is common to experience some constipation if taking pain medication after surgery.  Increasing fluid intake and taking a stool softener (such as Colace) will usually help or prevent this problem from occurring.  A mild laxative (Milk of Magnesia or Miralax) should be taken according to package instructions if there are no bowel movements after 48 hours. 7. Unless discharge instructions indicate otherwise, you may remove your bandages 24-48 hours after surgery, and you may shower at that time.  You may have steri-strips (small skin tapes) in place directly over the incision.  These strips should be left on the skin for 7-10 days.  If your surgeon used skin glue on the incision, you may shower in 24 hours.  The glue will flake off over the next 2-3 weeks.  Any sutures or staples will be removed at the office during your follow-up visit. 8. ACTIVITIES:  You may resume regular (light) daily activities beginning the next day--such as daily self-care, walking, climbing stairs--gradually increasing activities as tolerated.  You may have sexual intercourse when it is comfortable.  Refrain from any heavy lifting or straining until approved by your doctor. a. You may drive when you are no longer taking prescription pain medication, you can comfortably wear a seatbelt, and you can safely maneuver your car and apply brakes. b. RETURN TO WORK:  __________________________________________________________ 9. You should see your doctor in the office for a follow-up appointment approximately 2-3 weeks after your surgery.  Make sure that you call for  this appointment within a day or two after you arrive home to insure a convenient appointment time. 10. OTHER INSTRUCTIONS:  __________________________________________________________________________________________________________________________ __________________________________________________________________________________________________________________________ WHEN TO CALL YOUR DOCTOR: 1. Fever over 101.0 2. Inability to urinate 3. Continued bleeding from incision. 4. Increased pain, redness, or drainage from the incision. 5. Increasing abdominal pain  The clinic staff is available to answer your questions during regular business hours.  Please dont hesitate to call and ask to speak to one of the nurses for clinical concerns.  If you have a medical emergency, go to the nearest emergency room or call 911.  A surgeon from [facility] is always on call at the hospital. [address], [address], [place], [place]  [id number] ? [address] [address], [place], [place]   [id number] [phone] ? [phone] ? [phone]) [phone] Web site: [email or web address]   Post Anesthesia Home Care Instructions  Activity: Get plenty of rest for the remainder of the day. A responsible adult should stay with you for 24 hours following the procedure.  For the next 24 hours, DO NOT: -Drive a car -Advertising copywriterperate machinery -Drink alcoholic beverages -Take any medication unless instructed by your physician -Make any legal decisions or sign important papers.  Meals: Start with liquid foods such as gelatin or soup. Progress to regular foods as tolerated. Avoid greasy, spicy, heavy foods. If nausea and/or vomiting occur, drink only clear liquids until the nausea and/or vomiting subsides. Call your physician if vomiting continues.  Special Instructions/Symptoms: Your throat may feel dry or sore from the anesthesia or the breathing tube placed in your throat during surgery. If this causes discomfort, gargle with warm salt water. The discomfort should disappear within 24 hours.

## 2014-08-19 NOTE — Anesthesia Preprocedure Evaluation (Addendum)
Anesthesia Evaluation  Patient identified by MRN, date of birth, ID band Patient awake    Reviewed: Allergy & Precautions, H&P , NPO status , Patient's Chart, lab work & pertinent test results  Airway Mallampati: I TM Distance: >3 FB Neck ROM: Full    Dental   Pulmonary          Cardiovascular     Neuro/Psych    GI/Hepatic GERD-  Medicated and Controlled,  Endo/Other    Renal/GU      Musculoskeletal   Abdominal   Peds  Hematology   Anesthesia Other Findings   Reproductive/Obstetrics                           Anesthesia Physical Anesthesia Plan  ASA: II  Anesthesia Plan: General   Post-op Pain Management:    Induction: Intravenous  Airway Management Planned: Oral ETT  Additional Equipment:   Intra-op Plan:   Post-operative Plan: Extubation in OR  Informed Consent: I have reviewed the patients History and Physical, chart, labs and discussed the procedure including the risks, benefits and alternatives for the proposed anesthesia with the patient or authorized representative who has indicated his/her understanding and acceptance.     Plan Discussed with: CRNA and Surgeon  Anesthesia Plan Comments:         Anesthesia Quick Evaluation  

## 2014-08-19 NOTE — H&P (View-Only) (Signed)
onnie F. Dohrmann 07/28/2014 3:15 PM Location: [facility] Patient #: [id number] DOB: 02/22/1976 Single / Language: English / Race: White Female History of Present Illness (Joshoa Shawler A. Sandrina Heaton MD; 07/29/2014 7:17 AM) Patient words: gallbaldder   intermittent sharp RUQ abdominal pain after fatty meals for 3 months. Getting worse slowly. Associated nausea and vomiting with fatty foods. Better with low fat diet.                CLINICAL DATA: Right upper quadrant abdominal pain and nausea for 1 week  EXAM: ABDOMEN ULTRASOUND  COMPARISON: None  FINDINGS: Gallbladder  Multiple shadowing calculi within gallbladder up to at least 13 mm in size, some present at gallbladder neck. No gallbladder wall thickening, pericholecystic fluid or sonographic Murphy sign.  Common bile duct  Diameter: 6 mm diameter common upper normal  Liver  Borderline increased echogenicity. No focal mass lesion.  IVC  Normal appearance  Pancreas  Normal appearance  Spleen  Normal appearance, 8.0 cm length  Right Kidney  Length: 12.0 cm length. Normal morphology without mass or hydronephrosis.  Left Kidney  Length: 12.5 cm length. Normal morphology without mass or hydronephrosis.  Abdominal aorta  Bifurcation obscured by bowel gas. Visualized portion normal caliber.  No free-fluid.  IMPRESSION: Cholelithiasis without evidence acute cholecystitis.   Electronically Signed By: Mark Boles M.D. On: 06/13/2013 08:21.  The patient is a 38 year old female who presents for evaluation of gall stones. The onset of the gall stones has been acute and has been occurring in a persistent pattern for 3 months. The course has been gradually worsening. The gall stones is described as moderate. There has been associated abdominal pain, colicky pain and nausea. Precipitating factors include eating and excessive fat intake. Relieving factors include dietary changes. Other  Problems ([name], CMA; 07/28/2014 3:17 PM) Anxiety Disorder Cholelithiasis Depression Gastroesophageal Reflux Disease Hemorrhoids Melanoma Migraine Headache  Past Surgical History ([name], CMA; 07/28/2014 3:17 PM) Oral Surgery  Diagnostic Studies History ([name], CMA; 07/28/2014 3:17 PM) Colonoscopy never Mammogram never Pap Smear 1-5 years ago  Allergies ([name], CMA; 07/28/2014 3:18 PM) Sulfa Antibiotics  Medication History ([name], CMA; 07/28/2014 3:19 PM) LORazepam (0.5MG Tablet, Oral) Active. FLUoxetine HCl (20MG Tablet, Oral) Active. TriNessa (28) (0.18/0.215/0.25MG-35 MCG Tablet, Oral) Active. Tecfidera (240MG Capsule DR, Oral) Active. Gabapentin (100MG Capsule, Oral) Active.  Social History ([name], CMA; 07/28/2014 3:17 PM) Caffeine use Carbonated beverages, Coffee. No alcohol use No drug use Tobacco use Never smoker.  Family History ([name], CMA; 07/28/2014 3:17 PM) Arthritis Father, Mother. Melanoma Father. Migraine Headache Mother.  Pregnancy / Birth History ([name], CMA; 07/28/2014 3:17 PM) Age at menarche 9 years. Contraceptive History Oral contraceptives. Gravida 1 Maternal age 21-25 Para 1 Regular periods     Review of Systems ([name] CMA; 07/28/2014 3:17 PM) General Present- Fatigue. Not Present- Appetite Loss, Chills, Fever, Night Sweats, Weight Gain and Weight Loss. HEENT Present- Seasonal Allergies and Sinus Pain. Not Present- Earache, Hearing Loss, Hoarseness, Nose Bleed, Oral Ulcers, Ringing in the Ears, Sore Throat, Visual Disturbances, Wears glasses/contact lenses and Yellow Eyes. Respiratory Not Present- Bloody sputum, Chronic Cough, Difficulty Breathing, Snoring and Wheezing. Breast Not Present- Breast Mass, Breast Pain, Nipple Discharge and Skin Changes. Cardiovascular Not Present- Chest Pain, Difficulty Breathing Lying Down, Leg Cramps, Palpitations, Rapid Heart Rate,  Shortness of Breath and Swelling of Extremities. Gastrointestinal Not Present- Abdominal Pain, Bloating, Bloody Stool, Change in Bowel Habits, Chronic diarrhea, Constipation, Difficulty Swallowing, Excessive gas,   Gets full quickly at meals, Hemorrhoids, Indigestion, Nausea, Rectal Pain and Vomiting. Female Genitourinary Not Present- Frequency, Nocturia, Painful Urination, Pelvic Pain and Urgency. Musculoskeletal Present- Muscle Weakness. Not Present- Back Pain, Joint Pain, Joint Stiffness, Muscle Pain and Swelling of Extremities. Neurological Present- Decreased Memory, Tremor and Weakness. Not Present- Fainting, Headaches, Numbness, Seizures, Tingling and Trouble walking. Psychiatric Present- Anxiety. Not Present- Bipolar, Change in Sleep Pattern, Depression, Fearful and Frequent crying. Endocrine Present- Heat Intolerance. Not Present- Cold Intolerance, Excessive Hunger, Hair Changes, Hot flashes and New Diabetes. Hematology Present- Easy Bruising. Not Present- Excessive bleeding, Gland problems, HIV and Persistent Infections.  Vitals ([name] CMA; 07/28/2014 3:20 PM) 07/28/2014 3:19 PM Weight: 289 lb Height: 71in Body Surface Area: 2.56 m Body Mass Index: 40.31 kg/m Temp.: 98.66F(Temporal)  Pulse: 78 (Regular)  BP: 130/76 (Sitting, Left Arm, Standard)     Physical Exam (Stacye Noori A. Brynlee Pennywell MD; 07/28/2014 9:21 PM)  General Mental Status-Alert. General Appearance-Consistent with stated age. Hydration-Well hydrated. Voice-Normal.  Head and Neck Head-normocephalic, atraumatic with no lesions or palpable masses.  Eye Eyeball - Bilateral-Extraocular movements intact. Sclera/Conjunctiva - Bilateral-No scleral icterus.  Chest and Lung Exam Chest and lung exam reveals -quiet, even and easy respiratory effort with no use of accessory muscles and on auscultation, normal breath sounds, no adventitious sounds and normal vocal resonance. Inspection Chest Wall -  Normal. Back - normal.  Cardiovascular Cardiovascular examination reveals -on palpation PMI is normal in location and amplitude, no palpable S3 or S4. Normal cardiac borders., normal heart sounds, regular rate and rhythm with no murmurs, carotid auscultation reveals no bruits and normal pedal pulses bilaterally.  Abdomen Inspection Inspection of the abdomen reveals - No Hernias. Skin - Scar - no surgical scars. Palpation/Percussion Palpation and Percussion of the abdomen reveal - Soft, Non Tender, No Rebound tenderness, No Rigidity (guarding) and No hepatosplenomegaly. Auscultation Auscultation of the abdomen reveals - Bowel sounds normal.  Neurologic Neurologic evaluation reveals -alert and oriented x 3 with no impairment of recent or remote memory. Mental Status-Normal.  Musculoskeletal Normal Exam - Left-Upper Extremity Strength Normal and Lower Extremity Strength Normal. Normal Exam - Right-Upper Extremity Strength Normal, Lower Extremity Weakness.    Assessment & Plan (Jaquay Posthumus A. Emilo Gras MD; 07/28/2014 9:23 PM)  SYMPTOMATIC CHOLELITHIASIS (574.20  K80.20) Impression: The procedure has been discussed with the patient. Risks of laparoscopic cholecystectomy include bleeding, infection, bile duct injury, leak, death, open surgery, diarrhea, other surgery, organ injury, blood vessel injury, DVT, and additional care.  RECOMMEND LAPROSCOPIC CHOLECYSTECTOMY AND CHOLANGIOGRAM.  Current Plans Pt Education - Cholecystectomy: Gallbladder Removal with Open Surgery: laparoscopic cholecystectomy

## 2014-08-20 ENCOUNTER — Encounter (HOSPITAL_BASED_OUTPATIENT_CLINIC_OR_DEPARTMENT_OTHER): Payer: Self-pay | Admitting: Surgery

## 2014-08-20 NOTE — Progress Notes (Signed)
Dilaudid .5mg  wasted in sink . Witnessed by Dierdre Highman RN.  Patient received 1.5mg  iv Dilaudid total.

## 2015-07-30 ENCOUNTER — Other Ambulatory Visit: Payer: Self-pay | Admitting: Family Medicine

## 2015-07-30 ENCOUNTER — Other Ambulatory Visit (HOSPITAL_COMMUNITY)
Admission: RE | Admit: 2015-07-30 | Discharge: 2015-07-30 | Disposition: A | Discharge status: Home / Self Care | Payer: Managed Care, Other (non HMO) | Source: Ambulatory Visit | Attending: Family Medicine | Admitting: Family Medicine

## 2015-07-30 DIAGNOSIS — Z1151 Encounter for screening for human papillomavirus (HPV): Secondary | ICD-10-CM | POA: Diagnosis not present

## 2015-07-30 DIAGNOSIS — Z113 Encounter for screening for infections with a predominantly sexual mode of transmission: Secondary | ICD-10-CM | POA: Insufficient documentation

## 2015-07-30 DIAGNOSIS — Z01419 Encounter for gynecological examination (general) (routine) without abnormal findings: Secondary | ICD-10-CM | POA: Insufficient documentation

## 2015-08-03 LAB — CYTOLOGY - PAP

## 2016-03-17 DIAGNOSIS — J320 Chronic maxillary sinusitis: Secondary | ICD-10-CM | POA: Insufficient documentation

## 2016-03-17 DIAGNOSIS — J321 Chronic frontal sinusitis: Secondary | ICD-10-CM | POA: Insufficient documentation

## 2016-03-28 ENCOUNTER — Other Ambulatory Visit: Payer: Self-pay | Admitting: Otolaryngology

## 2016-03-28 MED FILL — HYDROCODON-APAP 5-325: 5-325 | 3 days supply | Qty: 30 | Fill #0

## 2016-03-28 MED FILL — AMOX TR-K CLV 875-125 MG TA: 875-125 | 10 days supply | Qty: 20 | Fill #0

## 2016-10-17 ENCOUNTER — Emergency Department (HOSPITAL_BASED_OUTPATIENT_CLINIC_OR_DEPARTMENT_OTHER)
Admission: EM | Admit: 2016-10-17 | Discharge: 2016-10-18 | Disposition: A | Discharge status: Home / Self Care | Payer: Managed Care, Other (non HMO) | Attending: Emergency Medicine | Admitting: Emergency Medicine

## 2016-10-17 ENCOUNTER — Encounter (HOSPITAL_BASED_OUTPATIENT_CLINIC_OR_DEPARTMENT_OTHER): Payer: Self-pay | Admitting: *Deleted

## 2016-10-17 ENCOUNTER — Emergency Department (HOSPITAL_BASED_OUTPATIENT_CLINIC_OR_DEPARTMENT_OTHER): Payer: Managed Care, Other (non HMO)

## 2016-10-17 DIAGNOSIS — K589 Irritable bowel syndrome without diarrhea: Secondary | ICD-10-CM | POA: Insufficient documentation

## 2016-10-17 DIAGNOSIS — S2220XA Unspecified fracture of sternum, initial encounter for closed fracture: Secondary | ICD-10-CM | POA: Diagnosis not present

## 2016-10-17 DIAGNOSIS — Y92410 Unspecified street and highway as the place of occurrence of the external cause: Secondary | ICD-10-CM | POA: Insufficient documentation

## 2016-10-17 DIAGNOSIS — Z79899 Other long term (current) drug therapy: Secondary | ICD-10-CM | POA: Insufficient documentation

## 2016-10-17 DIAGNOSIS — Y9389 Activity, other specified: Secondary | ICD-10-CM | POA: Insufficient documentation

## 2016-10-17 DIAGNOSIS — Y999 Unspecified external cause status: Secondary | ICD-10-CM | POA: Insufficient documentation

## 2016-10-17 DIAGNOSIS — S299XXA Unspecified injury of thorax, initial encounter: Secondary | ICD-10-CM | POA: Diagnosis present

## 2016-10-17 DIAGNOSIS — S40012A Contusion of left shoulder, initial encounter: Secondary | ICD-10-CM | POA: Diagnosis not present
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
## 2016-10-18 ENCOUNTER — Emergency Department (HOSPITAL_BASED_OUTPATIENT_CLINIC_OR_DEPARTMENT_OTHER): Payer: Managed Care, Other (non HMO)

## 2016-10-18 DIAGNOSIS — S2220XA Unspecified fracture of sternum, initial encounter for closed fracture: Secondary | ICD-10-CM | POA: Diagnosis not present

## 2016-10-18 LAB — PREGNANCY, URINE: Preg Test, Ur: NEGATIVE

## 2016-10-18 MED ORDER — OXYCODONE-ACETAMINOPHEN 5-325 MG PO TABS: 2.0000 | ORAL_TABLET | Freq: Once | ORAL | Status: DC

## 2016-10-18 MED ORDER — HYDROMORPHONE HCL 1 MG/ML IJ SOLN
1.0000 mg | Freq: Once | INTRAMUSCULAR | Status: AC
  Administered 2016-10-18: 1 mg via INTRAVENOUS
  Filled 2016-10-18: qty 1

## 2016-10-18 MED ORDER — ONDANSETRON HCL 4 MG/2ML IJ SOLN
4.0000 mg | Freq: Once | INTRAMUSCULAR | Status: AC
  Administered 2016-10-18: 4 mg via INTRAVENOUS
  Filled 2016-10-18: qty 2

## 2016-10-18 MED ORDER — IBUPROFEN 800 MG PO TABS: 800.0000 mg | ORAL_TABLET | Freq: Three times a day (TID) | ORAL | 0 refills | Status: DC

## 2016-10-18 MED ORDER — IOPAMIDOL (ISOVUE-300) INJECTION 61%
100.0000 mL | Freq: Once | INTRAVENOUS | Status: AC | PRN
  Administered 2016-10-18: 100 mL via INTRAVENOUS

## 2016-10-18 MED ORDER — HYDROCODONE-ACETAMINOPHEN 5-325 MG PO TABS: 1.0000 | ORAL_TABLET | ORAL | 0 refills | Status: DC | PRN

## 2016-10-18 NOTE — ED Notes (Signed)
Pt returned from CT °
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
## 2016-10-18 NOTE — Discharge Instructions (Signed)
Take the pain medication as prescribed. Do not take it if you are working or driving. Follow up with your doctor. Return to the ED if you develop new or worsening symptoms.

## 2016-10-18 NOTE — ED Notes (Signed)
Pt verbalizes understanding of d/c instructions and denies any further needs at this time. 

## 2016-11-01 ENCOUNTER — Ambulatory Visit (INDEPENDENT_AMBULATORY_CARE_PROVIDER_SITE_OTHER): Payer: Managed Care, Other (non HMO)

## 2016-11-01 ENCOUNTER — Encounter: Payer: Self-pay | Admitting: Sports Medicine

## 2016-11-01 ENCOUNTER — Ambulatory Visit (INDEPENDENT_AMBULATORY_CARE_PROVIDER_SITE_OTHER): Payer: Managed Care, Other (non HMO) | Admitting: Sports Medicine

## 2016-11-01 DIAGNOSIS — S2220XD Unspecified fracture of sternum, subsequent encounter for fracture with routine healing: Secondary | ICD-10-CM

## 2016-11-01 DIAGNOSIS — S42019A Nondisplaced fracture of sternal end of unspecified clavicle, initial encounter for closed fracture: Secondary | ICD-10-CM

## 2016-11-01 MED ORDER — HYDROCODONE-ACETAMINOPHEN 5-325 MG PO TABS: 1.0000 | ORAL_TABLET | Freq: Three times a day (TID) | ORAL | 0 refills | Status: DC | PRN
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
## 2016-11-02 ENCOUNTER — Encounter: Payer: Self-pay | Admitting: Sports Medicine

## 2016-11-02 ENCOUNTER — Ambulatory Visit (INDEPENDENT_AMBULATORY_CARE_PROVIDER_SITE_OTHER): Payer: Managed Care, Other (non HMO) | Admitting: Sports Medicine

## 2016-11-02 NOTE — Progress Notes (Signed)
   Patient returned with a form, thinking that this was FMLA, this was not an FMLA form and there was nothing for me to fill out, on further review of her pile of records the [facility] form had been filled out by another provider.  She can return at the previously discussed follow-up date, the visit will be no charged.

## 2016-11-15 ENCOUNTER — Encounter: Payer: Self-pay | Admitting: Sports Medicine

## 2016-11-15 ENCOUNTER — Ambulatory Visit (INDEPENDENT_AMBULATORY_CARE_PROVIDER_SITE_OTHER): Payer: Managed Care, Other (non HMO) | Admitting: Sports Medicine

## 2016-11-15 DIAGNOSIS — S2220XD Unspecified fracture of sternum, subsequent encounter for fracture with routine healing: Secondary | ICD-10-CM | POA: Diagnosis not present
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
## 2016-11-29 ENCOUNTER — Ambulatory Visit (INDEPENDENT_AMBULATORY_CARE_PROVIDER_SITE_OTHER): Payer: 59 | Admitting: Sports Medicine

## 2016-11-29 ENCOUNTER — Encounter: Payer: Self-pay | Admitting: Sports Medicine

## 2016-11-29 DIAGNOSIS — S2220XD Unspecified fracture of sternum, subsequent encounter for fracture with routine healing: Secondary | ICD-10-CM
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
## 2018-04-12 ENCOUNTER — Other Ambulatory Visit: Payer: Self-pay | Admitting: Family Medicine

## 2018-04-12 ENCOUNTER — Other Ambulatory Visit (HOSPITAL_COMMUNITY)
Admission: RE | Admit: 2018-04-12 | Discharge: 2018-04-12 | Disposition: A | Discharge status: Home / Self Care | Payer: 59 | Source: Ambulatory Visit | Attending: Family Medicine | Admitting: Family Medicine

## 2018-04-12 DIAGNOSIS — Z1231 Encounter for screening mammogram for malignant neoplasm of breast: Secondary | ICD-10-CM

## 2018-04-12 DIAGNOSIS — Z124 Encounter for screening for malignant neoplasm of cervix: Secondary | ICD-10-CM | POA: Diagnosis present

## 2018-04-17 LAB — CYTOLOGY - PAP
Adequacy: ABSENT
Diagnosis: NEGATIVE
HPV: NOT DETECTED

## 2018-04-26 ENCOUNTER — Encounter: Payer: Self-pay | Admitting: Neurology

## 2018-04-26 ENCOUNTER — Telehealth: Payer: Self-pay | Admitting: Neurology

## 2018-04-26 ENCOUNTER — Other Ambulatory Visit: Payer: Self-pay

## 2018-04-26 ENCOUNTER — Ambulatory Visit (INDEPENDENT_AMBULATORY_CARE_PROVIDER_SITE_OTHER): Payer: 59 | Admitting: Neurology

## 2018-04-26 VITALS — BP 138/81 | HR 71 | Resp 18 | Ht 70.0 in | Wt 335.5 lb

## 2018-04-26 DIAGNOSIS — R200 Anesthesia of skin: Secondary | ICD-10-CM | POA: Diagnosis not present

## 2018-04-26 DIAGNOSIS — G4733 Obstructive sleep apnea (adult) (pediatric): Secondary | ICD-10-CM | POA: Diagnosis not present

## 2018-04-26 DIAGNOSIS — G35 Multiple sclerosis: Secondary | ICD-10-CM

## 2018-04-26 DIAGNOSIS — F32A Depression, unspecified: Secondary | ICD-10-CM | POA: Insufficient documentation

## 2018-04-26 DIAGNOSIS — R5383 Other fatigue: Secondary | ICD-10-CM

## 2018-04-26 DIAGNOSIS — F329 Major depressive disorder, single episode, unspecified: Secondary | ICD-10-CM

## 2018-04-26 NOTE — Telephone Encounter (Signed)
[employer] order sent to [facility]. They obtain the auth and will reach out to the pt to schedule.  °

## 2018-04-26 NOTE — Progress Notes (Signed)
[facility]  PATIENT: Kristen Brown DOB: May 14, 1976  REFERRING DOCTOR OR PCP:  Kristen Hazel, MD SOURCE: patient, notes from Dr. Hyacinth Brown and Dr. Gaynelle Brown, imaging and lab reports, MRI images on PACS  _________________________________   HISTORICAL  CHIEF COMPLAINT:  Chief Complaint  Patient presents with  . Multiple Sclerosis    Kristen Brown is here to transfer care of MS to Dr. Epimenio Brown.  Sts. dx. in Nov. 2014   Presenting sx. was bilat optic neuritis. She saw an opthalmologist, then her pcp. Initial MRI done at [facility]. No LP.  She was referred to Dr. Gaynelle Brown at [facility]. and was started on Tecfidera, which she is still on today.  Sts. she tolerates it well and sts. last MRI's (brain and c-spine) done10/26/18 at [facility] did not show new lesions.  Sts. the sx. that bother her are intermittent blurry vision both eyes., right   . Decreased Visual Acuity    sided weakness and numbness, and pain in legs. Sts. she had one distant cousin with MS/fim    HISTORY OF PRESENT ILLNESS:  I had the pleasure of seeing your patient, Kristen Brown, at the MS center at [facility] for a neurologic consultation regarding her multiple sclerosis and more recent change in numbness.  She is a 42 year old woman who was diagnosed with MS in November 2014.    In early 2014,, she had numbness in her legs that ws ust present one morning.  It improved over a few weeks and she did not see any doctors.    Later that year, she had diplopia that was preent one morning.   Symptoms took 6 months to mostly recover.   She feels worse if very tired or hot.   She had an MRi of the brain showing multiple lesions c/with MS.   3 lesions enhanced.    She was referred to D.r Kristen Brown at [facility].   She started Tecfidera.    She needed to titrate slow;y due to GI upset when she tried a higher dose more rapidly.    She no longer has GI issues but has occasional flushing still that responds to aspirin.      Since starting the medication she did not had definite exacerbation until possibly earlier this year.     She has noted more numbness in the right arm form shoulder to finger and in her right leg, hip to toes.     She feels her walking is worse since the numbness started.   She has not noted improvement in the numbness compared to symptoms in the past that improved eventually.     She notes gait is doing ok but her endurance is worse.  If she had to, she could walk a mile but might feel worse the next day.   She notes the right leg is weaker than her left and sometimes gets cramps and spasms.  She had initially been on baclofen for the spasticity and more recently was switched to Flexeril.  She is doing better with the Flexeril.  She also notes numbness in the right arm and leg.    She has had much more urinary and bowel  urgency the past year than she used to.    She has no incontinence.    Vision is blurry.    A recent ophthalmology appt showed no ocular problems.      She notes fatigue is mild in the morning but worse by the end of the  day.   She often lays down when she gets home.   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    She is sleeping well most nights.  She was diagnosed with mild obstructive sleep apnea earlier this year.  She does not use any CPAP and has been advised to lose weight.  Mood is mildly off with some depression.   She feels she gets agitated easily.    She feels cognition is mildly off.   She has trouble multitasking.    She has ADD so has had poor focus and attention.      She gets 2-3 migraines a month, helped by Imitrex.      REVIEW OF SYSTEMS: Constitutional: No fevers, chills, sweats, or change in appetite.   She has fatigue. Eyes: No visual changes, double vision, eye pain Ear, nose and throat: No hearing loss, ear pain, nasal congestion, sore throat Cardiovascular: No chest pain, palpitations Respiratory: No shortness of breath at rest  or with exertion.   Mild OSA GastrointestinaI: No nausea, vomiting, diarrhea, abdominal pain, fecal incontinence Genitourinary: No dysuria, urinary retention or frequency.  No nocturia. Musculoskeletal: No neck pain, back pain Integumentary: No rash, pruritus, skin lesions Neurological: as above Psychiatric: No depression at this time.  No anxiety Endocrine: No palpitations, diaphoresis, change in appetite, change in weigh or increased thirst Hematologic/Lymphatic: No anemia, purpura, petechiae. Allergic/Immunologic: No itchy/runny eyes, nasal congestion, recent allergic reactions, rashes  ALLERGIES: Allergies  Allergen Reactions  . Sulfa Antibiotics Hives  . Adhesive [Tape] Rash  . Latex Rash    HOME MEDICATIONS:  Current Outpatient Medications:  .  cyclobenzaprine (FLEXERIL) 10 MG tablet, TAKE 1 TABLET BY MOUTH AT BEDTIME AS NEEDED, Disp: , Rfl:  .  DULoxetine (CYMBALTA) 60 MG capsule, , Disp: , Rfl:  .  ibuprofen (ADVIL,MOTRIN) 800 MG tablet, Take 1 tablet (800 mg total) by mouth 3 (three) times daily., Disp: 21 tablet, Rfl: 0 .  LORazepam (ATIVAN) 0.5 MG tablet, Take 0.5 mg by mouth every 8 (eight) hours., Disp: , Rfl:  .  magnesium gluconate (MAGONATE) 500 MG tablet, Take 250 mg by mouth daily., Disp: , Rfl:  .  SPRINTEC 28 0.25-35 MG-MCG tablet, Take 1 tablet by mouth daily., Disp: , Rfl: 0 .  SUMAtriptan (IMITREX) 50 MG tablet, TAKE 1 TAB(S) ORALLY ONCE, CAN REPEAT 2 HOURS LATER IF NEEDED, Disp: , Rfl: 0 .  Vitamin D, Ergocalciferol, (DRISDOL) 50000 units CAPS capsule, Take 50,000 Units by mouth once a week., Disp: , Rfl: 3  PAST MEDICAL HISTORY: Past Medical History:  Diagnosis Date  . Anxiety   . Depression   . Gallstones 07/2014  . GERD (gastroesophageal reflux disease)    no current med.  . Irritable bowel syndrome (IBS)   . Migraines   . Multiple sclerosis (HCC)   . Sinus infection 08/11/2014   will be finished with antibiotic prior to surgery  . Vision  abnormalities     PAST SURGICAL HISTORY: Past Surgical History:  Procedure Laterality Date  . CHOLECYSTECTOMY N/A 08/19/2014   Procedure: LAPAROSCOPIC CHOLECYSTECTOMY WITH INTRAOPERATIVE CHOLANGIOGRAM;  Surgeon: Harriette Bouillon, MD;  Location: [facility];  Service: General;  Laterality: N/A;  . NO PAST SURGERIES      FAMILY HISTORY: Family History  Problem Relation Age of Onset  . Hyperlipidemia Mother   . Hyperlipidemia Father   . Diabetes Father   . Atrial fibrillation Father   . Cancer Paternal Grandfather  colon  . Healthy Brother   . Lung cancer Maternal Grandmother   . Diabetes Paternal Grandmother     SOCIAL HISTORY:  Social History   Socioeconomic History  . Marital status: Single    Spouse name: Not on file  . Number of children: Not on file  . Years of education: Not on file  . Highest education level: Not on file  Occupational History  . Not on file  Social Needs  . Financial resource strain: Not on file  . Food insecurity:    Worry: Not on file    Inability: Not on file  . Transportation needs:    Medical: Not on file    Non-medical: Not on file  Tobacco Use  . Smoking status: Never Smoker  . Smokeless tobacco: Never Used  Substance and Sexual Activity  . Alcohol use: No  . Drug use: No  . Sexual activity: Not on file  Lifestyle  . Physical activity:    Days per week: Not on file    Minutes per session: Not on file  . Stress: Not on file  Relationships  . Social connections:    Talks on phone: Not on file    Gets together: Not on file    Attends religious service: Not on file    Active member of club or organization: Not on file    Attends meetings of clubs or organizations: Not on file    Relationship status: Not on file  . Intimate partner violence:    Fear of current or ex partner: Not on file    Emotionally abused: Not on file    Physically abused: Not on file    Forced sexual activity: Not on file  Other Topics  Concern  . Not on file  Social History Narrative  . Not on file     PHYSICAL EXAM  Vitals:   04/26/18 1322  BP: 138/81  Pulse: 71  Resp: 18  Weight: (!) 335 lb 8 oz (152.2 kg)  Height: 5\' 10"  (1.778 m)    Body mass index is 48.14 kg/m.   General: The patient is well-developed and well-nourished and in no acute distress  Eyes:  Funduscopic exam shows normal optic discs and retinal vessels.  Neck: The neck is supple, no carotid bruits are noted.  The neck is nontender.  Cardiovascular: The heart has a regular rate and rhythm with a normal S1 and S2. There were no murmurs, gallops or rubs.   Skin: Extremities are without significant edema.  Musculoskeletal:  Back is nontender  Neurologic Exam  Mental status: The patient is alert and oriented x 3 at the time of the examination. The patient has apparent normal recent and remote memory, with an apparently normal attention span and concentration ability.   Speech is normal.  Cranial nerves: Extraocular movements are full. Pupils are equal, round, and reactive to light and accomodation.  Visual fields are full.  Facial symmetry is present. There is good facial sensation to soft touch bilaterally.Facial strength is normal.  Trapezius and sternocleidomastoid strength is normal. No dysarthria is noted.  The tongue is midline, and the patient has symmetric elevation of the soft palate. No obvious hearing deficits are noted.  Motor:  Muscle bulk is normal.   Tone is normal. Strength is  5 / 5 in all 4 extremities.   Sensory: Sensory testing shows reduced vibration and temperature sensation in hte right arm and leg.   Touch was more sytmmetric.  Coordination: Cerebellar testing reveals good finger-nose-finger and reduced right heel-to-shin   Gait and station: Station is normal.   Gait is normal. Tandem gait is wide. Romberg is negative.   Reflexes: Deep tendon reflexes are symmetric and normal bilaterally.   Plantar responses are  flexor.    DIAGNOSTIC DATA (LABS, IMAGING, TESTING) - I reviewed patient records, labs, notes, testing and imaging myself where available.  Lab Results  Component Value Date   WBC 7.4 08/12/2014   HGB 13.2 08/19/2014   HCT 39.4 08/12/2014   MCV 89.1 08/12/2014   PLT 181 08/12/2014      Component Value Date/Time   NA 138 08/12/2014 1220   K 4.8 08/12/2014 1220   CL 100 08/12/2014 1220   CO2 27 08/12/2014 1220   GLUCOSE 76 08/12/2014 1220   BUN 14 08/12/2014 1220   CREATININE 0.56 08/12/2014 1220   CALCIUM 9.2 08/12/2014 1220   PROT 7.3 08/12/2014 1220   ALBUMIN 3.7 08/12/2014 1220   AST 18 08/12/2014 1220   ALT 17 08/12/2014 1220   ALKPHOS 76 08/12/2014 1220   BILITOT 0.3 08/12/2014 1220   GFRNONAA >90 08/12/2014 1220   GFRAA >90 08/12/2014 1220      ASSESSMENT AND PLAN  Multiple sclerosis (HCC) - Plan: CBC with Differential/Platelet, Stratify JCV Antibody Test ([facility]), MR BRAIN W WO CONTRAST, MR CERVICAL SPINE W WO CONTRAST  Numbness  OSA (obstructive sleep apnea)  Other fatigue  Depression, unspecified depression type Little boy who got up to get an  In summary, Ms. Nie is a 42 year old woman who was diagnosed with multiple sclerosis in 2014 after presenting with diplopia and numbness.  She is currently on Tecfidera.  She has not had any definite exacerbation.  However, earlier this year she had the onset of more numbness on the right side of her body and also notes that the right leg seems to be a little bit weaker.  Therefore, she might have had a small brain or cervical spine exacerbation.  She feels she has not improved, unlike the exacerbation she had in 2014.  We discussed obtaining an MRI of the brain and cervical spine to determine if she is having subclinical progression.  If this is occurring, we would need to discuss switching from Tecfidera to a more efficacious medication.  Symptomatically, she is doing fairly well with her current regimen.  She will  continue Flexeril which is helping her spasticity better than the baclofen did.  She will try to remain active and we discussed losing weight.  She will return to see me in 6 months for regular visit or sooner if there are new or worsening neurologic symptoms.  Thank you for asking me to see Ms. Toor.  Please let me know if I can be of further assistance with her or other patients in the future.   Iria Jamerson A. Kristen Foot, MD, [facility] 04/26/2018, 2:15 PM Certified in Neurology, Clinical Neurophysiology, Sleep Medicine, Pain Medicine and Neuroimaging  [facility] [address] [place], [place] [address] [phone]

## 2018-04-27 LAB — CBC WITH DIFFERENTIAL/PLATELET
Basophils Absolute: 0 10*3/uL (ref 0.0–0.2)
Basos: 0 %
EOS (ABSOLUTE): 0.2 10*3/uL (ref 0.0–0.4)
Eos: 3 %
Hematocrit: 36.6 % (ref 34.0–46.6)
Hemoglobin: 12 g/dL (ref 11.1–15.9)
Immature Grans (Abs): 0 10*3/uL (ref 0.0–0.1)
Immature Granulocytes: 0 %
Lymphocytes Absolute: 1.3 10*3/uL (ref 0.7–3.1)
Lymphs: 24 %
MCH: 28.1 pg (ref 26.6–33.0)
MCHC: 32.8 g/dL (ref 31.5–35.7)
MCV: 86 fL (ref 79–97)
Monocytes Absolute: 0.4 10*3/uL (ref 0.1–0.9)
Monocytes: 8 %
Neutrophils Absolute: 3.5 10*3/uL (ref 1.4–7.0)
Neutrophils: 65 %
Platelets: 146 10*3/uL — ABNORMAL LOW (ref 150–450)
RBC: 4.27 x10E6/uL (ref 3.77–5.28)
RDW: 13.4 % (ref 12.3–15.4)
WBC: 5.3 10*3/uL (ref 3.4–10.8)

## 2018-05-03 ENCOUNTER — Ambulatory Visit
Admission: RE | Admit: 2018-05-03 | Discharge: 2018-05-03 | Disposition: A | Discharge status: Home / Self Care | Payer: 59 | Source: Ambulatory Visit | Attending: Family Medicine | Admitting: Family Medicine

## 2018-05-03 DIAGNOSIS — Z1231 Encounter for screening mammogram for malignant neoplasm of breast: Secondary | ICD-10-CM

## 2018-05-11 ENCOUNTER — Ambulatory Visit
Admission: RE | Admit: 2018-05-11 | Discharge: 2018-05-11 | Disposition: A | Discharge status: Home / Self Care | Payer: 59 | Source: Ambulatory Visit | Attending: Neurology | Admitting: Neurology

## 2018-05-11 DIAGNOSIS — G35 Multiple sclerosis: Secondary | ICD-10-CM

## 2018-05-11 MED ORDER — GADOBENATE DIMEGLUMINE 529 MG/ML IV SOLN
20.0000 mL | Freq: Once | INTRAVENOUS | Status: AC | PRN
  Administered 2018-05-11: 20 mL via INTRAVENOUS

## 2018-05-16 NOTE — Telephone Encounter (Signed)
-----   Message from Asa Lente, MD sent at 05/15/2018  8:29 PM EDT ----- Please let the patient know that the MRI's do not show any new lesions compared to older films  Note for record:  The brain MRI showed one spot in theleft middle cerebellar peduncle not present in 2014 (we have those films) but that spot was noted on the 2015 MRI at [facility]

## 2018-07-24 MED ORDER — ETODOLAC 400 MG PO TABS: 400.0000 mg | ORAL_TABLET | Freq: Two times a day (BID) | ORAL | 0 refills | Status: DC

## 2018-08-20 ENCOUNTER — Other Ambulatory Visit: Payer: Self-pay | Admitting: Neurology

## 2018-09-24 ENCOUNTER — Other Ambulatory Visit: Payer: Self-pay | Admitting: Neurology

## 2018-10-31 ENCOUNTER — Ambulatory Visit: Payer: 59 | Admitting: Neurology

## 2018-11-01 ENCOUNTER — Encounter: Payer: Self-pay | Admitting: Neurology

## 2018-11-07 DIAGNOSIS — Z0289 Encounter for other administrative examinations: Secondary | ICD-10-CM

## 2018-11-09 ENCOUNTER — Other Ambulatory Visit: Payer: Self-pay | Admitting: Neurology

## 2019-01-16 ENCOUNTER — Telehealth: Payer: Self-pay | Admitting: *Deleted

## 2019-01-16 NOTE — Telephone Encounter (Signed)
Called pt. She agreed to virtual visit with [email or web address].  I texted her link via [email or web address]. Asked her to call back if she does not receive.   I updated her medication list/pharmacy/allergies on file.

## 2019-01-16 NOTE — Telephone Encounter (Signed)
Pt returned call and stated she doesn't know when she will be able to do a VV because she is working tomorrow , she is requesting a call back to discuss

## 2019-01-16 NOTE — Telephone Encounter (Signed)
Called, LVM for pt to call office back today about her appt tomorrow. Wanting to convert to a VV. due to current COVID-19 pandemic, our office is severely reducing in office visits in order to minimize the risk to our patients and healthcare providers.  I also sent pt mychart message.

## 2019-01-17 ENCOUNTER — Ambulatory Visit (INDEPENDENT_AMBULATORY_CARE_PROVIDER_SITE_OTHER): Payer: 59 | Admitting: Neurology

## 2019-01-17 ENCOUNTER — Encounter: Payer: Self-pay | Admitting: Neurology

## 2019-01-17 ENCOUNTER — Other Ambulatory Visit: Payer: Self-pay

## 2019-01-17 DIAGNOSIS — F329 Major depressive disorder, single episode, unspecified: Secondary | ICD-10-CM | POA: Diagnosis not present

## 2019-01-17 DIAGNOSIS — R200 Anesthesia of skin: Secondary | ICD-10-CM

## 2019-01-17 DIAGNOSIS — Z6841 Body Mass Index (BMI) 40.0 and over, adult: Secondary | ICD-10-CM

## 2019-01-17 DIAGNOSIS — R5383 Other fatigue: Secondary | ICD-10-CM | POA: Diagnosis not present

## 2019-01-17 DIAGNOSIS — G35 Multiple sclerosis: Secondary | ICD-10-CM

## 2019-01-17 DIAGNOSIS — R41840 Attention and concentration deficit: Secondary | ICD-10-CM | POA: Insufficient documentation

## 2019-01-17 DIAGNOSIS — F32A Depression, unspecified: Secondary | ICD-10-CM

## 2019-01-17 MED ORDER — PHENTERMINE HCL 37.5 MG PO CAPS: 37.5000 mg | ORAL_CAPSULE | ORAL | 5 refills | Status: DC

## 2019-01-17 MED ORDER — ETODOLAC 400 MG PO TABS: ORAL_TABLET | ORAL | 5 refills | Status: DC

## 2019-01-17 NOTE — Progress Notes (Signed)
[facility]  PATIENT: Kristen Brown DOB: 04/17/1976  REFERRING DOCTOR OR PCP:  Sigmund HazelLisa Miller, MD SOURCE: patient, notes from Dr. Hyacinth MeekerMiller and Dr. Gaynelle AduAbou-Zeid, imaging and lab reports, MRI images on PACS  _________________________________   HISTORICAL  CHIEF COMPLAINT:  No chief complaint on file.  Update 01/17/2019: Virtual Visit via Video Note I connected with@ on 01/17/19 at 11:00 AM EDT by a video enabled telemedicine application and verified that I am speaking with the correct person.  I discussed the limitations of evaluation and management by telemedicine and the availability of in person appointments. The patient expressed understanding and agreed to proceed.  History of Present Illness: She feels her MS is mostly stable.   She takes Tecfidera and still gets intermittent flushing, helped by aspirin.   MRI of the brain 05/11/2018 showed 1 or 2 more foci that were not present compared to the 2014 MRI including one in the middle cerebellar peduncle.  MRI of the cervical spine shows 1 probable MS focus.  She feels gait is fine though endurance is poor.  She gets a deep pain and also has an itch sensation in her legs.  There is some aching on her right side.    She has urinary urgency (sometimes bowel) and has occasional incontinence (bladder or bowel).  Vision is ok for the most part though she notes some blurriness when tired.    She is noting more fatigue.   She has some verbal fluency, occ with writing.    She sometimes notes reduced focus and attnetion.  She is more moody and gets aggravated easily.     Etodolac helps the leg pain some.  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     Migraines are doing ok and sumatriptan is helpful when one    Observations/Objective She is a well-developed well-nourished woman in no acute distress.  The head is normocephalic and atraumatic.  Sclera are anicteric.   Visible skin appears normal.  The neck has a good range of motion.  Pharynx and tongue have normal appearance.  She is alert and fully oriented with fluent speech and good attention, knowledge and memory.  Extraocular muscles are intact.  Facial strength is normal.  Palatal elevation and tongue protrusion are midline.  She appears to have normal strength in the arms.  Rapid alternating movements and finger-nose-finger are performed well.  Assessment and Plan: Multiple sclerosis (HCC)  Numbness  Other fatigue  Depression, unspecified depression type  Attention deficit  1.  Continue Tecfidera for MS.  Take aspirin as needed for the flushing. 2.  Stay active and exercise as tolerated.  Renew etodolac for back pain and leg pain. 3.   Continue to help with weight loss, fatigue, focus/attention. 4.   Follow-up in 6 months or sooner if there are new or worsening neurologic symptoms.  Follow Up Instructions: I discussed the assessment and treatment plan with the patient. The patient was provided an opportunity to ask questions and all were answered. The patient agreed with the plan and demonstrated an understanding of the instructions.    The patient was advised to call back or seek an in-person evaluation if the symptoms worsen or if the condition fails to improve as anticipated.  I provided 26 minutes of non-face-to-face time during this encounter.  ________________________________________________________ From initial consultation: HISTORY OF PRESENT ILLNESS:  I had the pleasure of seeing your patient, Kristen SilversConnie Brown, at  the MS center at [facility] for a neurologic consultation regarding her multiple sclerosis and more recent change in numbness.  She is a 43 year old woman who was diagnosed with MS in November 2014.    In early 2014,, she had numbness in her legs that ws ust present one morning.  It improved over a few weeks and she did not see any doctors.    Later that  year, she had diplopia that was preent one morning.   Symptoms took 6 months to mostly recover.   She feels worse if very tired or hot.   She had an MRi of the brain showing multiple lesions c/with MS.   3 lesions enhanced.    She was referred to D.r Gaynelle Adu at [facility].   She started Tecfidera.    She needed to titrate slow;y due to GI upset when she tried a higher dose more rapidly.    She no longer has GI issues but has occasional flushing still that responds to aspirin.     Since starting the medication she did not had definite exacerbation until possibly earlier this year.     She has noted more numbness in the right arm form shoulder to finger and in her right leg, hip to toes.     She feels her walking is worse since the numbness started.   She has not noted improvement in the numbness compared to symptoms in the past that improved eventually.     She notes gait is doing ok but her endurance is worse.  If she had to, she could walk a mile but might feel worse the next day.   She notes the right leg is weaker than her left and sometimes gets cramps and spasms.  She had initially been on baclofen for the spasticity and more recently was switched to Flexeril.  She is doing better with the Flexeril.  She also notes numbness in the right arm and leg.    She has had much more urinary and bowel  urgency the past year than she used to.    She has no incontinence.    Vision is blurry.    A recent ophthalmology appt showed no ocular problems.      She notes fatigue is mild in the morning but worse by the end of the day.   She often lays down when she gets home.   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    She is sleeping well most nights.  She was diagnosed with mild obstructive sleep apnea earlier this year.  She does not use any CPAP and has been advised to lose weight.  Mood is mildly off with some depression.   She feels she gets agitated easily.    She feels cognition is mildly  off.   She has trouble multitasking.    She has ADD so has had poor focus and attention.      She gets 2-3 migraines a month, helped by Imitrex.      REVIEW OF SYSTEMS: Constitutional: No fevers, chills, sweats, or change in appetite.   She has fatigue. Eyes: No visual changes, double vision, eye pain Ear, nose and throat: No hearing loss, ear pain, nasal congestion, sore throat Cardiovascular: No chest pain, palpitations Respiratory: No shortness of breath at rest or with exertion.   Mild OSA GastrointestinaI: No nausea, vomiting, diarrhea, abdominal pain, fecal incontinence Genitourinary: No dysuria, urinary retention or frequency.  No nocturia. Musculoskeletal: No neck pain, back pain Integumentary: No rash, pruritus, skin lesions Neurological: as above Psychiatric: No depression at this time.  No anxiety Endocrine: No palpitations, diaphoresis, change in appetite, change in weigh or increased thirst Hematologic/Lymphatic: No anemia, purpura, petechiae. Allergic/Immunologic: No itchy/runny eyes, nasal congestion, recent allergic reactions, rashes  ALLERGIES: Allergies  Allergen Reactions   Sulfa Antibiotics Hives   Adhesive [Tape] Rash   Latex Rash    HOME MEDICATIONS:  Current Outpatient Medications:    cyclobenzaprine (FLEXERIL) 10 MG tablet, TAKE 1 TABLET BY MOUTH AT BEDTIME AS NEEDED, Disp: , Rfl:    DULoxetine (CYMBALTA) 60 MG capsule, Take 90 mg by mouth daily. , Disp: , Rfl:    etodolac (LODINE) 400 MG tablet, TAKE 1 TAB 2 TIMES DAILY MAY NOT TAKE ADDITIONAL NSAID'S (INCLUDING IBUPROFEN) WHILE TAKING ETODOLAC, Disp: 60 tablet, Rfl: 5   ibuprofen (ADVIL,MOTRIN) 800 MG tablet, Take 1 tablet (800 mg total) by mouth 3 (three) times daily., Disp: 21 tablet, Rfl: 0   LORazepam (ATIVAN) 0.5 MG tablet, Take 0.5 mg by mouth every 8 (eight) hours., Disp: , Rfl:    magnesium gluconate (MAGONATE) 500 MG tablet, Take 250 mg by mouth daily., Disp: , Rfl:    phentermine  37.5 MG capsule, Take 1 capsule (37.5 mg total) by mouth every morning., Disp: 30 capsule, Rfl: 5   SPRINTEC 28 0.25-35 MG-MCG tablet, Take 1 tablet by mouth daily., Disp: , Rfl: 0   SUMAtriptan (IMITREX) 50 MG tablet, TAKE 1 TAB(S) ORALLY ONCE, CAN REPEAT 2 HOURS LATER IF NEEDED, Disp: , Rfl: 0   Vitamin D, Ergocalciferol, (DRISDOL) 50000 units CAPS capsule, Take 50,000 Units by mouth once a week., Disp: , Rfl: 3  PAST MEDICAL HISTORY: Past Medical History:  Diagnosis Date   Anxiety    Depression    Gallstones 07/2014   GERD (gastroesophageal reflux disease)    no current med.   Irritable bowel syndrome (IBS)    Migraines    Multiple sclerosis (HCC)    Sinus infection 08/11/2014   will be finished with antibiotic prior to surgery   Vision abnormalities     PAST SURGICAL HISTORY: Past Surgical History:  Procedure Laterality Date   CHOLECYSTECTOMY N/A 08/19/2014   Procedure: LAPAROSCOPIC CHOLECYSTECTOMY WITH INTRAOPERATIVE CHOLANGIOGRAM;  Surgeon: Harriette Bouillon, MD;  Location: [facility];  Service: General;  Laterality: N/A;   NO PAST SURGERIES      FAMILY HISTORY: Family History  Problem Relation Age of Onset   Hyperlipidemia Mother    Hyperlipidemia Father    Diabetes Father    Atrial fibrillation Father    Cancer Paternal Grandfather        colon   Healthy Brother    Lung cancer Maternal Grandmother    Diabetes Paternal Grandmother     SOCIAL HISTORY:  Social History   Socioeconomic History   Marital status: Single    Spouse name: Not on file   Number of children: Not on file   Years of education: Not on file   Highest education level: Not on file  Occupational History   Not on file  Social Needs   Financial resource strain: Not on file   Food insecurity:    Worry: Not on file    Inability: Not on file   Transportation needs:    Medical: Not on file    Non-medical: Not on file  Tobacco Use   Smoking  status: Never Smoker   Smokeless tobacco:  Never Used  Substance and Sexual Activity   Alcohol use: No   Drug use: No   Sexual activity: Not on file  Lifestyle   Physical activity:    Days per week: Not on file    Minutes per session: Not on file   Stress: Not on file  Relationships   Social connections:    Talks on phone: Not on file    Gets together: Not on file    Attends religious service: Not on file    Active member of club or organization: Not on file    Attends meetings of clubs or organizations: Not on file    Relationship status: Not on file   Intimate partner violence:    Fear of current or ex partner: Not on file    Emotionally abused: Not on file    Physically abused: Not on file    Forced sexual activity: Not on file  Other Topics Concern   Not on file  Social History Narrative   Not on file     PHYSICAL EXAM  There were no vitals filed for this visit.  There is no height or weight on file to calculate BMI.   General: The patient is well-developed and well-nourished and in no acute distress  Eyes:  Funduscopic exam shows normal optic discs and retinal vessels.  Neck: The neck is supple, no carotid bruits are noted.  The neck is nontender.  Cardiovascular: The heart has a regular rate and rhythm with a normal S1 and S2. There were no murmurs, gallops or rubs.   Skin: Extremities are without significant edema.  Musculoskeletal:  Back is nontender  Neurologic Exam  Mental status: The patient is alert and oriented x 3 at the time of the examination. The patient has apparent normal recent and remote memory, with an apparently normal attention span and concentration ability.   Speech is normal.  Cranial nerves: Extraocular movements are full. Pupils are equal, round, and reactive to light and accomodation.  Visual fields are full.  Facial symmetry is present. There is good facial sensation to soft touch bilaterally.Facial strength is normal.   Trapezius and sternocleidomastoid strength is normal. No dysarthria is noted.  The tongue is midline, and the patient has symmetric elevation of the soft palate. No obvious hearing deficits are noted.  Motor:  Muscle bulk is normal.   Tone is normal. Strength is  5 / 5 in all 4 extremities.   Sensory: Sensory testing shows reduced vibration and temperature sensation in hte right arm and leg.   Touch was more sytmmetric.    Coordination: Cerebellar testing reveals good finger-nose-finger and reduced right heel-to-shin   Gait and station: Station is normal.   Gait is normal. Tandem gait is wide. Romberg is negative.   Reflexes: Deep tendon reflexes are symmetric and normal bilaterally.   Plantar responses are flexor.    DIAGNOSTIC DATA (LABS, IMAGING, TESTING) - I reviewed patient records, labs, notes, testing and imaging myself where available.  Lab Results  Component Value Date   WBC 5.3 04/26/2018   HGB 12.0 04/26/2018   HCT 36.6 04/26/2018   MCV 86 04/26/2018   PLT 146 (L) 04/26/2018      Component Value Date/Time   NA 138 08/12/2014 1220   K 4.8 08/12/2014 1220   CL 100 08/12/2014 1220   CO2 27 08/12/2014 1220   GLUCOSE 76 08/12/2014 1220   BUN 14 08/12/2014 1220   CREATININE 0.56 08/12/2014 1220  CALCIUM 9.2 08/12/2014 1220   PROT 7.3 08/12/2014 1220   ALBUMIN 3.7 08/12/2014 1220   AST 18 08/12/2014 1220   ALT 17 08/12/2014 1220   ALKPHOS 76 08/12/2014 1220   BILITOT 0.3 08/12/2014 1220   GFRNONAA >90 08/12/2014 1220   GFRAA >90 08/12/2014 1220      ASSESSMENT AND PLAN  Multiple sclerosis (HCC)  Numbness  Other fatigue  Depression, unspecified depression type  Attention deficit   Kaydi Kley A. Epimenio Foot, MD, [facility] 01/17/2019, 12:44 PM Certified in Neurology, Clinical Neurophysiology, Sleep Medicine, Pain Medicine and Neuroimaging  [facility] [address] [place], [place] [address] [phone]

## 2019-05-14 ENCOUNTER — Other Ambulatory Visit: Payer: Self-pay | Admitting: Family Medicine

## 2019-05-14 DIAGNOSIS — F329 Major depressive disorder, single episode, unspecified: Secondary | ICD-10-CM

## 2019-05-14 DIAGNOSIS — G35 Multiple sclerosis: Secondary | ICD-10-CM

## 2019-05-14 DIAGNOSIS — Z1231 Encounter for screening mammogram for malignant neoplasm of breast: Secondary | ICD-10-CM

## 2019-05-14 DIAGNOSIS — F32A Depression, unspecified: Secondary | ICD-10-CM

## 2019-05-20 MED ORDER — ARIPIPRAZOLE 5 MG PO TABS: 5.0000 mg | ORAL_TABLET | Freq: Every day | ORAL | 5 refills | Status: DC

## 2019-06-06 ENCOUNTER — Other Ambulatory Visit: Payer: Self-pay | Admitting: Neurology

## 2019-06-11 ENCOUNTER — Other Ambulatory Visit: Payer: Self-pay | Admitting: Neurology

## 2019-06-26 ENCOUNTER — Other Ambulatory Visit: Payer: Self-pay

## 2019-06-26 ENCOUNTER — Ambulatory Visit
Admission: RE | Admit: 2019-06-26 | Discharge: 2019-06-26 | Disposition: A | Discharge status: Home / Self Care | Payer: 59 | Source: Ambulatory Visit | Attending: Family Medicine | Admitting: Family Medicine

## 2019-06-26 DIAGNOSIS — Z1231 Encounter for screening mammogram for malignant neoplasm of breast: Secondary | ICD-10-CM

## 2019-07-02 ENCOUNTER — Other Ambulatory Visit: Payer: Self-pay | Admitting: Chiropractic Medicine

## 2019-07-04 ENCOUNTER — Other Ambulatory Visit: Payer: Self-pay | Admitting: Chiropractic Medicine

## 2019-07-04 DIAGNOSIS — M545 Low back pain, unspecified: Secondary | ICD-10-CM

## 2019-07-06 ENCOUNTER — Other Ambulatory Visit: Payer: Self-pay

## 2019-07-06 ENCOUNTER — Ambulatory Visit
Admission: RE | Admit: 2019-07-06 | Discharge: 2019-07-06 | Disposition: A | Discharge status: Home / Self Care | Payer: 59 | Source: Ambulatory Visit | Attending: Chiropractic Medicine | Admitting: Chiropractic Medicine

## 2019-07-06 DIAGNOSIS — M545 Low back pain, unspecified: Secondary | ICD-10-CM

## 2019-07-08 ENCOUNTER — Ambulatory Visit (INDEPENDENT_AMBULATORY_CARE_PROVIDER_SITE_OTHER): Payer: 59 | Admitting: Psychology

## 2019-07-08 DIAGNOSIS — F329 Major depressive disorder, single episode, unspecified: Secondary | ICD-10-CM | POA: Diagnosis not present

## 2019-07-11 ENCOUNTER — Other Ambulatory Visit: Payer: Self-pay | Admitting: Neurology

## 2019-07-26 ENCOUNTER — Ambulatory Visit (INDEPENDENT_AMBULATORY_CARE_PROVIDER_SITE_OTHER): Payer: 59 | Admitting: Psychology

## 2019-07-26 DIAGNOSIS — F329 Major depressive disorder, single episode, unspecified: Secondary | ICD-10-CM

## 2019-07-30 ENCOUNTER — Ambulatory Visit (INDEPENDENT_AMBULATORY_CARE_PROVIDER_SITE_OTHER): Payer: 59 | Admitting: Psychology

## 2019-07-30 ENCOUNTER — Other Ambulatory Visit: Payer: Self-pay | Admitting: Neurology

## 2019-07-30 DIAGNOSIS — F329 Major depressive disorder, single episode, unspecified: Secondary | ICD-10-CM | POA: Diagnosis not present

## 2019-08-05 ENCOUNTER — Other Ambulatory Visit: Payer: Self-pay | Admitting: Neurology

## 2019-08-05 ENCOUNTER — Ambulatory Visit (INDEPENDENT_AMBULATORY_CARE_PROVIDER_SITE_OTHER): Payer: 59 | Admitting: Psychology

## 2019-08-05 DIAGNOSIS — F329 Major depressive disorder, single episode, unspecified: Secondary | ICD-10-CM

## 2019-08-19 ENCOUNTER — Ambulatory Visit (INDEPENDENT_AMBULATORY_CARE_PROVIDER_SITE_OTHER): Payer: 59 | Admitting: Psychology

## 2019-08-19 DIAGNOSIS — F329 Major depressive disorder, single episode, unspecified: Secondary | ICD-10-CM | POA: Diagnosis not present

## 2019-08-30 ENCOUNTER — Ambulatory Visit (INDEPENDENT_AMBULATORY_CARE_PROVIDER_SITE_OTHER): Payer: 59 | Admitting: Psychology

## 2019-08-30 DIAGNOSIS — F329 Major depressive disorder, single episode, unspecified: Secondary | ICD-10-CM

## 2019-09-09 ENCOUNTER — Ambulatory Visit: Payer: 59 | Admitting: Psychology

## 2019-09-09 ENCOUNTER — Other Ambulatory Visit: Payer: Self-pay | Admitting: Neurology

## 2019-09-22 ENCOUNTER — Other Ambulatory Visit: Payer: Self-pay | Admitting: Neurology

## 2019-09-23 ENCOUNTER — Ambulatory Visit (INDEPENDENT_AMBULATORY_CARE_PROVIDER_SITE_OTHER): Payer: No Typology Code available for payment source | Admitting: Psychology

## 2019-09-23 ENCOUNTER — Encounter: Payer: Self-pay | Admitting: Neurology

## 2019-09-23 DIAGNOSIS — F329 Major depressive disorder, single episode, unspecified: Secondary | ICD-10-CM

## 2019-10-03 ENCOUNTER — Ambulatory Visit: Payer: No Typology Code available for payment source | Admitting: Psychology

## 2019-10-05 ENCOUNTER — Ambulatory Visit (INDEPENDENT_AMBULATORY_CARE_PROVIDER_SITE_OTHER): Payer: No Typology Code available for payment source | Admitting: Psychology

## 2019-10-05 DIAGNOSIS — F329 Major depressive disorder, single episode, unspecified: Secondary | ICD-10-CM

## 2019-10-07 ENCOUNTER — Ambulatory Visit: Payer: 59 | Admitting: Psychology

## 2019-10-09 ENCOUNTER — Ambulatory Visit: Payer: No Typology Code available for payment source | Admitting: Psychology

## 2019-10-21 ENCOUNTER — Ambulatory Visit (INDEPENDENT_AMBULATORY_CARE_PROVIDER_SITE_OTHER): Payer: No Typology Code available for payment source | Admitting: Psychology

## 2019-10-21 DIAGNOSIS — F329 Major depressive disorder, single episode, unspecified: Secondary | ICD-10-CM

## 2019-10-28 ENCOUNTER — Encounter: Payer: Self-pay | Admitting: Adult Health

## 2019-10-28 ENCOUNTER — Other Ambulatory Visit: Payer: Self-pay

## 2019-10-28 ENCOUNTER — Ambulatory Visit (INDEPENDENT_AMBULATORY_CARE_PROVIDER_SITE_OTHER): Payer: No Typology Code available for payment source | Admitting: Adult Health

## 2019-10-28 VITALS — BP 177/52 | HR 79 | Ht 71.0 in | Wt 265.0 lb

## 2019-10-28 DIAGNOSIS — F329 Major depressive disorder, single episode, unspecified: Secondary | ICD-10-CM | POA: Diagnosis not present

## 2019-10-28 DIAGNOSIS — F429 Obsessive-compulsive disorder, unspecified: Secondary | ICD-10-CM

## 2019-10-28 DIAGNOSIS — R41840 Attention and concentration deficit: Secondary | ICD-10-CM | POA: Diagnosis not present

## 2019-10-28 DIAGNOSIS — F32A Depression, unspecified: Secondary | ICD-10-CM

## 2019-10-28 DIAGNOSIS — F423 Hoarding disorder: Secondary | ICD-10-CM

## 2019-10-28 DIAGNOSIS — G4700 Insomnia, unspecified: Secondary | ICD-10-CM | POA: Diagnosis not present

## 2019-10-28 MED ORDER — ESCITALOPRAM OXALATE 10 MG PO TABS: 10.0000 mg | ORAL_TABLET | Freq: Every day | ORAL | 2 refills | Status: DC

## 2019-10-28 NOTE — Progress Notes (Signed)
[facility] MD/PA/NP Initial Note  10/28/2019 9:01 AM Kristen Brown  MRN:  [id number]  Chief Complaint:   HPI:  Describes mood today as "so-so". Mood symptoms - reports depression, anxiety, and irritability. Stating "I have felt this way as long as I can remember". Reports a "traumatic childhood". Stating "I never fit in and was always awkward". 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 Feels like she "constantly" worries - about family - "always". Gets overwhelmed. Has "random" thoughts. Stating "when I'm I get calm for a few minutes, "it' takes over". Takes a "long time" to get to sleep because she can't quit thinking about stuff. Keeping stuff at home -"not wanting to get rid of things" -keeping clothes, dishes, other stuff. Has a lot of trash in the house she needs to take out. Stating "some things are clean, others not so much". Has to keep things at work in order - "desk has to stay clean". Stable interest and motivation. Taking medications as prescribed.  Energy levels vart. Active, does not have a regular exercise routine. Works full-time - 40 hours a week.  Enjoys some usual interests and activities. Single. Never married. Has 1 son - senior in high school. Enjoys Barrister's clerk. Spending time with family. Parents local - mother in [place], father in [place]. Has a "good friend" at work.  Appetite worse than it should be - over and under eating most days. Weight gain. Sitting down more at work - "hurting more". Sleeping difficulties. Averages 3 to 5 hours. Wakes up a lot "hurting". Naps during the day "as often as I can".  Focus and concentration difficulties. Diagnosed with ADHD x 3 years ago. Completing tasks. Managing aspects of household. Works for [employer] x 28 years - [employer]. Denies SI or HI. Denies AH or VH.  Previous medication trials: Wellbutrin, Prozac   Visit Diagnosis: No diagnosis found.  Past Psychiatric History: Denies  psychiatric hospitalization.  Past Medical History:  Past Medical History:  Diagnosis Date  . Anxiety   . Depression   . Gallstones 07/2014  . GERD (gastroesophageal reflux disease)    no current med.  . Irritable bowel syndrome (IBS)   . Migraines   . Multiple sclerosis ([place])   . Sinus infection 08/11/2014   will be finished with antibiotic prior to surgery  . Vision abnormalities     Past Surgical History:  Procedure Laterality Date  . CHOLECYSTECTOMY N/A 08/19/2014   Procedure: LAPAROSCOPIC CHOLECYSTECTOMY WITH INTRAOPERATIVE CHOLANGIOGRAM;  Surgeon: Erroll Luna, MD;  Location: [facility];  Service: General;  Laterality: N/A;  . NO PAST SURGERIES      Family Psychiatric History: Denies any family history of mental illness.  Family History:  Family History  Problem Relation Age of Onset  . Hyperlipidemia Mother   . Hyperlipidemia Father   . Diabetes Father   . Atrial fibrillation Father   . Cancer Paternal Grandfather        colon  . Healthy Brother   . Lung cancer Maternal Grandmother   . Diabetes Paternal Grandmother     Social History:  Social History   Socioeconomic History  . Marital status: Single    Spouse name: Not on file  . Number of children: Not on file  . Years of education: Not on file  . Highest education level: Not on file  Occupational History  . Not on file  Tobacco Use  . Smoking status: Never Smoker  .  Smokeless tobacco: Never Used  Substance and Sexual Activity  . Alcohol use: No  . Drug use: No  . Sexual activity: Not on file  Other Topics Concern  . Not on file  Social History Narrative  . Not on file   Social Determinants of Health   Financial Resource Strain:   . Difficulty of Paying Living Expenses: Not on file  Food Insecurity:   . Worried About Programme researcher, broadcasting/film/video in the Last Year: Not on file  . Ran Out of Food in the Last Year: Not on file  Transportation Needs:   . Lack of Transportation  (Medical): Not on file  . Lack of Transportation (Non-Medical): Not on file  Physical Activity:   . Days of Exercise per Week: Not on file  . Minutes of Exercise per Session: Not on file  Stress:   . Feeling of Stress : Not on file  Social Connections:   . Frequency of Communication with Friends and Family: Not on file  . Frequency of Social Gatherings with Friends and Family: Not on file  . Attends Religious Services: Not on file  . Active Member of Clubs or Organizations: Not on file  . Attends Banker Meetings: Not on file  . Marital Status: Not on file    Allergies:  Allergies  Allergen Reactions  . Sulfa Antibiotics Hives  . Adhesive [Tape] Rash  . Latex Rash    Metabolic Disorder Labs: No results found for: HGBA1C, MPG No results found for: PROLACTIN No results found for: CHOL, TRIG, HDL, CHOLHDL, VLDL, LDLCALC No results found for: TSH  Therapeutic Level Labs: No results found for: LITHIUM No results found for: VALPROATE No components found for:  CBMZ  Current Medications: Current Outpatient Medications  Medication Sig Dispense Refill  . ARIPiprazole (ABILIFY) 5 MG tablet Take 1 tablet (5 mg total) by mouth daily. 30 tablet 5  . cyclobenzaprine (FLEXERIL) 10 MG tablet TAKE 1 TABLET BY MOUTH AT BEDTIME AS NEEDED    . DULoxetine (CYMBALTA) 60 MG capsule Take 90 mg by mouth daily.     Marland Kitchen etodolac (LODINE) 400 MG tablet TAKE 1 TAB 2 TIMES DAILY MAY NOT TAKE ADDITIONAL NSAID'S (INCLUDING IBUPROFEN) WHILE TAKING ETODOLAC 60 tablet 5  . ibuprofen (ADVIL,MOTRIN) 800 MG tablet Take 1 tablet (800 mg total) by mouth 3 (three) times daily. 21 tablet 0  . LORazepam (ATIVAN) 0.5 MG tablet Take 0.5 mg by mouth every 8 (eight) hours.    . magnesium gluconate (MAGONATE) 500 MG tablet Take 250 mg by mouth daily.    . phentermine 37.5 MG capsule TAKE 1 CAPSULE BY MOUTH EVERY MORNING 30 capsule 5  . SPRINTEC 28 0.25-35 MG-MCG tablet Take 1 tablet by mouth daily.  0  .  SUMAtriptan (IMITREX) 50 MG tablet TAKE 1 TAB(S) ORALLY ONCE, CAN REPEAT 2 HOURS LATER IF NEEDED  0  . Vitamin D, Ergocalciferol, (DRISDOL) 50000 units CAPS capsule Take 50,000 Units by mouth once a week.  3   No current facility-administered medications for this visit.    Medication Side Effects: none  Orders placed this visit:  No orders of the defined types were placed in this encounter.   Psychiatric Specialty Exam:  Review of Systems  Musculoskeletal: Negative for gait problem.  Neurological: Negative for tremors.  Psychiatric/Behavioral:       Please refer to HPI    There were no vitals taken for this visit.There is no height or weight on file to  calculate BMI.  General Appearance: Neat and Well Groomed  Eye Contact:  Good  Speech:  Clear and Coherent and Normal Rate  Volume:  Normal  Mood:  Anxious, Depressed and Irritable  Affect:  Appropriate and Congruent  Thought Process:  Coherent and Descriptions of Associations: Intact  Orientation:  Full (Time, Place, and Person)  Thought Content: Logical   Suicidal Thoughts:  No  Homicidal Thoughts:  No  Memory:  WNL  Judgement:  Good  Insight:  Good  Psychomotor Activity:  Normal  Concentration:  Concentration: Good  Recall:  Good  Fund of Knowledge: Good  Language: Good  Assets:  Communication Skills Desire for Improvement Financial Resources/Insurance Housing Intimacy Leisure Time Physical Health Resilience Social Support Talents/Skills Transportation Vocational/Educational  ADL's:  Intact  Cognition: WNL  Prognosis:  Good   Screenings: None  Receiving Psychotherapy: Yes - [facility]  Treatment Plan/Recommendations:   Plan:  PDMP reviewed  1. Abilify 5mg  daily 2. Cymbalta 60mg  daily 3. Lorazepam 0.5mg  daily prn 4. Add Lexapro 10mg  - 1/2 tab x 7 days, then one tablet daily  RTC 4 weeks  Patient advised to contact office with any questions, adverse effects, or acute worsening in signs and  symptoms.   , NP

## 2019-11-04 ENCOUNTER — Ambulatory Visit (INDEPENDENT_AMBULATORY_CARE_PROVIDER_SITE_OTHER): Payer: No Typology Code available for payment source | Admitting: Psychology

## 2019-11-04 DIAGNOSIS — F329 Major depressive disorder, single episode, unspecified: Secondary | ICD-10-CM

## 2019-11-14 ENCOUNTER — Other Ambulatory Visit: Payer: Self-pay | Admitting: Neurology

## 2019-11-18 ENCOUNTER — Ambulatory Visit (INDEPENDENT_AMBULATORY_CARE_PROVIDER_SITE_OTHER): Payer: 59 | Admitting: Psychology

## 2019-11-18 DIAGNOSIS — F329 Major depressive disorder, single episode, unspecified: Secondary | ICD-10-CM | POA: Diagnosis not present

## 2019-11-20 ENCOUNTER — Other Ambulatory Visit: Payer: Self-pay | Admitting: Adult Health

## 2019-11-20 DIAGNOSIS — F429 Obsessive-compulsive disorder, unspecified: Secondary | ICD-10-CM

## 2019-11-20 DIAGNOSIS — F423 Hoarding disorder: Secondary | ICD-10-CM

## 2019-11-20 DIAGNOSIS — F329 Major depressive disorder, single episode, unspecified: Secondary | ICD-10-CM

## 2019-11-20 DIAGNOSIS — F32A Depression, unspecified: Secondary | ICD-10-CM

## 2019-11-21 ENCOUNTER — Other Ambulatory Visit: Payer: Self-pay | Admitting: Neurology

## 2019-11-21 ENCOUNTER — Other Ambulatory Visit: Payer: Self-pay | Admitting: *Deleted

## 2019-11-21 MED ORDER — ARIPIPRAZOLE 5 MG PO TABS: 5.0000 mg | ORAL_TABLET | Freq: Every day | ORAL | 0 refills | Status: DC

## 2019-11-25 ENCOUNTER — Other Ambulatory Visit: Payer: Self-pay

## 2019-11-25 ENCOUNTER — Ambulatory Visit (INDEPENDENT_AMBULATORY_CARE_PROVIDER_SITE_OTHER): Payer: 59 | Admitting: Adult Health

## 2019-11-25 ENCOUNTER — Encounter: Payer: Self-pay | Admitting: Adult Health

## 2019-11-25 DIAGNOSIS — F32A Depression, unspecified: Secondary | ICD-10-CM

## 2019-11-25 DIAGNOSIS — R41840 Attention and concentration deficit: Secondary | ICD-10-CM | POA: Diagnosis not present

## 2019-11-25 DIAGNOSIS — G4700 Insomnia, unspecified: Secondary | ICD-10-CM

## 2019-11-25 DIAGNOSIS — F423 Hoarding disorder: Secondary | ICD-10-CM

## 2019-11-25 DIAGNOSIS — F329 Major depressive disorder, single episode, unspecified: Secondary | ICD-10-CM | POA: Diagnosis not present

## 2019-11-25 DIAGNOSIS — F411 Generalized anxiety disorder: Secondary | ICD-10-CM

## 2019-11-25 DIAGNOSIS — F429 Obsessive-compulsive disorder, unspecified: Secondary | ICD-10-CM

## 2019-11-25 MED ORDER — ESCITALOPRAM OXALATE 20 MG PO TABS: 20.0000 mg | ORAL_TABLET | Freq: Every day | ORAL | 2 refills | Status: DC

## 2019-11-25 NOTE — Progress Notes (Signed)
Kristen Brown [id number] 1976-02-02 44 y.o.  Subjective:   Patient ID:  Kristen Brown is a 44 y.o. (DOB March 24, 1976) female.  Chief Complaint:  Chief Complaint  Patient presents with  . Anxiety  . Depression  . ADHD  . Insomnia  . Other    OCD, hoarding    HPI Kristen Brown presents to the office today for follow-up of OCD, MDD, insomnia, hoarding, ADHD, and anxiety.  HPI:  Describes mood today as "ok". Mood symptoms - reports decreased depression, anxiety, and irritability. Stating "I feel a little better". Reports the first two weeks after starting Lexapro had some side effects, but they are gone. Now feeling like Lexapro is "working". Decreased use of Ativan. Reports having a "stressful" day a few days ago and "handled it well". Stating it went "nice and smooth". Seeing a Veterinary surgeon. Continues to worry and has "stressful" events upcoming. Making lists and trying to be ready for things. Not taking as long to get to sleep. Has not gotten rid of things or started claening her house. Working with therapist. Stable interest and motivation. Taking medications as prescribed.  Energy levels "better". Active, does not have a regular exercise routine. Works full-time - 40 hours a week.  Enjoys some usual interests and activities. Single. Never married. Has 1 son - senior in high school. Enjoys Clinical cytogeneticist. Spending time with family. Parents local - mother in [place], father in [place].  Appetite the "same". Weight gain.  Sleep has improved. Averages 7 hours. Naps in the evening. Focus and concentration "not improved". Diagnosed with ADHD x 3 years ago. Completing tasks. Managing aspects of household. Works for [employer] x 28 years - [employer]. Denies SI or HI. Denies AH or VH.  Previous medication trials: Wellbutrin, Prozac    Review of Systems:  Review of Systems  Musculoskeletal: Negative for gait problem.  Neurological: Negative for tremors.  Psychiatric/Behavioral:   Please refer to HPI    Medications: I have reviewed the patient's current medications.  Current Outpatient Medications  Medication Sig Dispense Refill  . ARIPiprazole (ABILIFY) 5 MG tablet Take 1 tablet (5 mg total) by mouth daily. 90 tablet 0  . cyclobenzaprine (FLEXERIL) 10 MG tablet TAKE 1 TABLET BY MOUTH AT BEDTIME AS NEEDED    . Dimethyl Fumarate 240 MG CPDR     . DULoxetine (CYMBALTA) 30 MG capsule 1 CAPSULE ONCE A DAY WITH 60MG  CAP ORALLY 90 DAYS    . DULoxetine (CYMBALTA) 60 MG capsule Take 90 mg by mouth daily.     escitalopram (LEXAPRO) 20 MG tablet Take 1 tablet (20 mg total) by mouth daily. 30 tablet 2  . etodolac (LODINE) 400 MG tablet TAKE 1 TAB 2 TIMES DAILY MAY NOT TAKE ADDITIONAL NSAID'S (INCLUDING IBUPROFEN) WHILE TAKING ETODOLAC 60 tablet 1  . ibuprofen (ADVIL,MOTRIN) 800 MG tablet Take 1 tablet (800 mg total) by mouth 3 (three) times daily. 21 tablet 0  . LORazepam (ATIVAN) 0.5 MG tablet Take 0.5 mg by mouth every 8 (eight) hours.    . magnesium gluconate (MAGONATE) 500 MG tablet Take 250 mg by mouth daily.    . phentermine 37.5 MG capsule TAKE 1 CAPSULE BY MOUTH EVERY MORNING 30 capsule 5  . SPRINTEC 28 0.25-35 MG-MCG tablet Take 1 tablet by mouth daily.  0  . SUMAtriptan (IMITREX) 50 MG tablet TAKE 1 TAB(S) ORALLY ONCE, CAN REPEAT 2 HOURS LATER IF NEEDED  0  . Vitamin D, Ergocalciferol, (DRISDOL) 50000 units CAPS capsule Take 50,000  Units by mouth once a week.  3   No current facility-administered medications for this visit.    Medication Side Effects: None  Allergies:  Allergies  Allergen Reactions  . Sulfa Antibiotics Hives  . Adhesive [Tape] Rash  . Latex Rash    Past Medical History:  Diagnosis Date  . Anxiety   . Depression   . Gallstones 07/2014  . GERD (gastroesophageal reflux disease)    no current med.  . Irritable bowel syndrome (IBS)   . Migraines   . Multiple sclerosis ([place])   . Sinus infection 08/11/2014   will be finished with  antibiotic prior to surgery  . Vision abnormalities     Family History  Problem Relation Age of Onset  . Hyperlipidemia Mother   . Hyperlipidemia Father   . Diabetes Father   . Atrial fibrillation Father   . Cancer Paternal Grandfather        colon  . Healthy Brother   . Lung cancer Maternal Grandmother   . Diabetes Paternal Grandmother     Social History   Socioeconomic History  . Marital status: Single    Spouse name: Not on file  . Number of children: Not on file  . Years of education: Not on file  . Highest education level: Not on file  Occupational History  . Not on file  Tobacco Use  . Smoking status: Never Smoker  . Smokeless tobacco: Never Used  Substance and Sexual Activity  . Alcohol use: No  . Drug use: No  . Sexual activity: Not on file  Other Topics Concern  . Not on file  Social History Narrative  . Not on file   Social Determinants of Health   Financial Resource Strain:   . Difficulty of Paying Living Expenses: Not on file  Food Insecurity:   . Worried About Charity fundraiser in the Last Year: Not on file  . Ran Out of Food in the Last Year: Not on file  Transportation Needs:   . Lack of Transportation (Medical): Not on file  . Lack of Transportation (Non-Medical): Not on file  Physical Activity:   . Days of Exercise per Week: Not on file  . Minutes of Exercise per Session: Not on file  Stress:   . Feeling of Stress : Not on file  Social Connections:   . Frequency of Communication with Friends and Family: Not on file  . Frequency of Social Gatherings with Friends and Family: Not on file  . Attends Religious Services: Not on file  . Active Member of Clubs or Organizations: Not on file  . Attends Archivist Meetings: Not on file  . Marital Status: Not on file  Intimate Partner Violence:   . Fear of Current or Ex-Partner: Not on file  . Emotionally Abused: Not on file  . Physically Abused: Not on file  . Sexually Abused: Not on  file    Past Medical History, Surgical history, Social history, and Family history were reviewed and updated as appropriate.   Please see review of systems for further details on the patient's review from today.   Objective:   Physical Exam:  There were no vitals taken for this visit.  Physical Exam Constitutional:      General: She is not in acute distress. Musculoskeletal:        General: No deformity.  Neurological:     Mental Status: She is alert and oriented to person, place, and time.  Coordination: Coordination normal.  Psychiatric:        Attention and Perception: Attention and perception normal. She does not perceive auditory or visual hallucinations.        Mood and Affect: Mood normal. Mood is not anxious or depressed. Affect is not labile, blunt, angry or inappropriate.        Speech: Speech normal.        Behavior: Behavior normal.        Thought Content: Thought content normal. Thought content is not paranoid or delusional. Thought content does not include homicidal or suicidal ideation. Thought content does not include homicidal or suicidal plan.        Cognition and Memory: Cognition and memory normal.        Judgment: Judgment normal.     Comments: Insight intact     Lab Review:     Component Value Date/Time   NA 138 08/12/2014 1220   K 4.8 08/12/2014 1220   CL 100 08/12/2014 1220   CO2 27 08/12/2014 1220   GLUCOSE 76 08/12/2014 1220   BUN 14 08/12/2014 1220   CREATININE 0.56 08/12/2014 1220   CALCIUM 9.2 08/12/2014 1220   PROT 7.3 08/12/2014 1220   ALBUMIN 3.7 08/12/2014 1220   AST 18 08/12/2014 1220   ALT 17 08/12/2014 1220   ALKPHOS 76 08/12/2014 1220   BILITOT 0.3 08/12/2014 1220   GFRNONAA >90 08/12/2014 1220   GFRAA >90 08/12/2014 1220       Component Value Date/Time   WBC 5.3 04/26/2018 1411   WBC 7.4 08/12/2014 1220   RBC 4.27 04/26/2018 1411   RBC 4.42 08/12/2014 1220   HGB 12.0 04/26/2018 1411   HCT 36.6 04/26/2018 1411   PLT  146 (L) 04/26/2018 1411   MCV 86 04/26/2018 1411   MCH 28.1 04/26/2018 1411   MCH 28.5 08/12/2014 1220   MCHC 32.8 04/26/2018 1411   MCHC 32.0 08/12/2014 1220   RDW 13.4 04/26/2018 1411   LYMPHSABS 1.3 04/26/2018 1411   MONOABS 0.6 08/12/2014 1220   EOSABS 0.2 04/26/2018 1411   BASOSABS 0.0 04/26/2018 1411    No results found for: POCLITH, LITHIUM   No results found for: PHENYTOIN, PHENOBARB, VALPROATE, CBMZ   .res Assessment: Plan:   Plan:  PDMP reviewed  1. Abilify 5mg  daily 2. Cymbalta 60mg  daily 3. Lorazepam 0.5mg  daily prn - decreased use 4. Add Lexapro 10mg  to 20mg  daily  RTC 4 weeks  Patient advised to contact office with any questions, adverse effects, or acute worsening in signs and symptoms.  Discussed potential benefits, risk, and side effects of benzodiazepines to include potential risk of tolerance and dependence, as well as possible drowsiness.  Advised patient not to drive if experiencing drowsiness and to take lowest possible effective dose to minimize risk of dependence and tolerance.  Discussed potential metabolic side effects associated with atypical antipsychotics, as well as potential risk for movement side effects. Advised pt to contact office if movement side effects occur.   Kristen Brown was seen today for anxiety, depression, adhd, insomnia and other.  Diagnoses and all orders for this visit:  Depression, unspecified depression type -     escitalopram (LEXAPRO) 20 MG tablet; Take 1 tablet (20 mg total) by mouth daily.  Attention deficit  Obsessive-compulsive disorder, unspecified type -     escitalopram (LEXAPRO) 20 MG tablet; Take 1 tablet (20 mg total) by mouth daily.  Insomnia, unspecified type  Hoarding disorder -     escitalopram (LEXAPRO)  20 MG tablet; Take 1 tablet (20 mg total) by mouth daily.  Generalized anxiety disorder     Please see After Visit Summary for patient specific instructions.  Future Appointments  Date Time  Provider Department Center  12/02/2019 11:00 AM Nada Maclachlan, LCSW [facility] None  12/16/2019 11:00 AM Nada Maclachlan, LCSW [facility] None  12/30/2019  8:30 AM Shawnie Dapper, NP [facility] None  12/30/2019 11:00 AM Nada Maclachlan, LCSW [facility] None  01/13/2020 11:00 AM Nada Maclachlan, LCSW [facility] None  01/27/2020 11:00 AM Nada Maclachlan, LCSW [facility] None  02/10/2020 11:00 AM Nada Maclachlan, LCSW [facility] None  02/24/2020 11:00 AM Nada Maclachlan, LCSW [facility] None  03/09/2020 11:00 AM Nada Maclachlan, LCSW [facility] None    No orders of the defined types were placed in this encounter.   -------------------------------

## 2019-12-02 ENCOUNTER — Ambulatory Visit (INDEPENDENT_AMBULATORY_CARE_PROVIDER_SITE_OTHER): Payer: 59 | Admitting: Psychology

## 2019-12-02 DIAGNOSIS — F329 Major depressive disorder, single episode, unspecified: Secondary | ICD-10-CM | POA: Diagnosis not present

## 2019-12-16 ENCOUNTER — Ambulatory Visit (INDEPENDENT_AMBULATORY_CARE_PROVIDER_SITE_OTHER): Payer: 59 | Admitting: Psychology

## 2019-12-16 DIAGNOSIS — F329 Major depressive disorder, single episode, unspecified: Secondary | ICD-10-CM | POA: Diagnosis not present

## 2019-12-17 ENCOUNTER — Other Ambulatory Visit: Payer: Self-pay | Admitting: Adult Health

## 2019-12-17 DIAGNOSIS — F423 Hoarding disorder: Secondary | ICD-10-CM

## 2019-12-17 DIAGNOSIS — F429 Obsessive-compulsive disorder, unspecified: Secondary | ICD-10-CM

## 2019-12-17 DIAGNOSIS — F329 Major depressive disorder, single episode, unspecified: Secondary | ICD-10-CM

## 2019-12-17 DIAGNOSIS — F32A Depression, unspecified: Secondary | ICD-10-CM

## 2019-12-23 ENCOUNTER — Other Ambulatory Visit: Payer: Self-pay

## 2019-12-23 ENCOUNTER — Ambulatory Visit (INDEPENDENT_AMBULATORY_CARE_PROVIDER_SITE_OTHER): Payer: 59 | Admitting: Adult Health

## 2019-12-23 ENCOUNTER — Encounter: Payer: Self-pay | Admitting: Adult Health

## 2019-12-23 DIAGNOSIS — F411 Generalized anxiety disorder: Secondary | ICD-10-CM

## 2019-12-23 DIAGNOSIS — F329 Major depressive disorder, single episode, unspecified: Secondary | ICD-10-CM | POA: Diagnosis not present

## 2019-12-23 DIAGNOSIS — F32A Depression, unspecified: Secondary | ICD-10-CM

## 2019-12-23 DIAGNOSIS — G4700 Insomnia, unspecified: Secondary | ICD-10-CM | POA: Diagnosis not present

## 2019-12-23 DIAGNOSIS — F429 Obsessive-compulsive disorder, unspecified: Secondary | ICD-10-CM

## 2019-12-23 DIAGNOSIS — F423 Hoarding disorder: Secondary | ICD-10-CM

## 2019-12-23 NOTE — Progress Notes (Signed)
Kristen Brown [id number] July 17, 1976 44 y.o.  Subjective:   Patient ID:  Kristen Brown is a 44 y.o. (DOB [birth year]) female.  Chief Complaint: No chief complaint on file.   HPI Kristen Brown presents to the office today for follow-up of OCD, MDD, insomnia, hoarding, ADHD, and anxiety.  Describes mood today as "ok". Mood symptoms - reports decreased depression, anxiety, and irritability. Stating "I'm having a little more anxiety with changes at work". 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 Feels a little "overwhelmed" some days. She and son getting along well. Seeing a counselor - plans to start trauma therapy. Stable interest and motivation. Taking medications as prescribed and feels they are working well.  Energy levels stable. Active, does not have a regular exercise routine. Walking some days. Works full-time - 40 hours a week.  Enjoys some usual interests and activities. Single. Never married. Has 1 son - senior in high school. Spending time with family and friends. Parents local - mother in [place], father in [place].  Appetite adequate. Weight stable.  Sleep has improved. Averages 7 hours. Will take a nap after work some days.  Focus and concentration "a little off". Stating "my ADHD has kicked in lately". Diagnosed with ADHD x 3 years ago. Completing tasks. Managing aspects of household. Works for [employer] x 28 years - [employer]. Denies SI or HI. Denies AH or VH.  Previous medication trials: Wellbutrin, Prozac    Review of Systems:  Review of Systems  Musculoskeletal: Negative for gait problem.  Neurological: Negative for tremors.  Psychiatric/Behavioral:       Please refer to HPI    Medications: I have reviewed the patient's current medications.  Current Outpatient Medications  Medication Sig Dispense Refill  . ARIPiprazole (ABILIFY) 5 MG tablet Take 1 tablet (5 mg total) by mouth daily. 90 tablet 0  . cyclobenzaprine  (FLEXERIL) 10 MG tablet TAKE 1 TABLET BY MOUTH AT BEDTIME AS NEEDED    . Dimethyl Fumarate 240 MG CPDR     . DULoxetine (CYMBALTA) 30 MG capsule 1 CAPSULE ONCE A DAY WITH 60MG  CAP ORALLY 90 DAYS    . DULoxetine (CYMBALTA) 60 MG capsule Take 90 mg by mouth daily.     escitalopram (LEXAPRO) 20 MG tablet Take 1 tablet (20 mg total) by mouth daily. 30 tablet 2  . etodolac (LODINE) 400 MG tablet TAKE 1 TAB 2 TIMES DAILY MAY NOT TAKE ADDITIONAL NSAID'S (INCLUDING IBUPROFEN) WHILE TAKING ETODOLAC 60 tablet 1  . ibuprofen (ADVIL,MOTRIN) 800 MG tablet Take 1 tablet (800 mg total) by mouth 3 (three) times daily. 21 tablet 0  . LORazepam (ATIVAN) 0.5 MG tablet Take 0.5 mg by mouth every 8 (eight) hours.    . magnesium gluconate (MAGONATE) 500 MG tablet Take 250 mg by mouth daily.    . phentermine 37.5 MG capsule TAKE 1 CAPSULE BY MOUTH EVERY MORNING 30 capsule 5  . SPRINTEC 28 0.25-35 MG-MCG tablet Take 1 tablet by mouth daily.  0  . SUMAtriptan (IMITREX) 50 MG tablet TAKE 1 TAB(S) ORALLY ONCE, CAN REPEAT 2 HOURS LATER IF NEEDED  0  . Vitamin D, Ergocalciferol, (DRISDOL) 50000 units CAPS capsule Take 50,000 Units by mouth once a week.  3   No current facility-administered medications for this visit.    Medication Side Effects: None  Allergies:  Allergies  Allergen Reactions  . Sulfa Antibiotics Hives  . Adhesive [Tape] Rash  . Latex Rash    Past Medical  History:  Diagnosis Date  . Anxiety   . Depression   . Gallstones 07/2014  . GERD (gastroesophageal reflux disease)    no current med.  . Irritable bowel syndrome (IBS)   . Migraines   . Multiple sclerosis ([place])   . Sinus infection 08/11/2014   will be finished with antibiotic prior to surgery  . Vision abnormalities     Family History  Problem Relation Age of Onset  . Hyperlipidemia Mother   . Hyperlipidemia Father   . Diabetes Father   . Atrial fibrillation Father   . Cancer Paternal Grandfather        colon  . Healthy  Brother   . Lung cancer Maternal Grandmother   . Diabetes Paternal Grandmother     Social History   Socioeconomic History  . Marital status: Single    Spouse name: Not on file  . Number of children: Not on file  . Years of education: Not on file  . Highest education level: Not on file  Occupational History  . Not on file  Tobacco Use  . Smoking status: Never Smoker  . Smokeless tobacco: Never Used  Substance and Sexual Activity  . Alcohol use: No  . Drug use: No  . Sexual activity: Not on file  Other Topics Concern  . Not on file  Social History Narrative  . Not on file   Social Determinants of Health   Financial Resource Strain:   . Difficulty of Paying Living Expenses:   Food Insecurity:   . Worried About Charity fundraiser in the Last Year:   . Arboriculturist in the Last Year:   Transportation Needs:   . Film/video editor (Medical):   Marland Kitchen Lack of Transportation (Non-Medical):   Physical Activity:   . Days of Exercise per Week:   . Minutes of Exercise per Session:   Stress:   . Feeling of Stress :   Social Connections:   . Frequency of Communication with Friends and Family:   . Frequency of Social Gatherings with Friends and Family:   . Attends Religious Services:   . Active Member of Clubs or Organizations:   . Attends Archivist Meetings:   Marland Kitchen Marital Status:   Intimate Partner Violence:   . Fear of Current or Ex-Partner:   . Emotionally Abused:   Marland Kitchen Physically Abused:   . Sexually Abused:     Past Medical History, Surgical history, Social history, and Family history were reviewed and updated as appropriate.   Please see review of systems for further details on the patient's review from today.   Objective:   Physical Exam:  There were no vitals taken for this visit.  Physical Exam Constitutional:      General: She is not in acute distress. Musculoskeletal:        General: No deformity.  Neurological:     Mental Status: She is  alert and oriented to person, place, and time.     Coordination: Coordination normal.  Psychiatric:        Attention and Perception: Attention and perception normal. She does not perceive auditory or visual hallucinations.        Mood and Affect: Mood normal. Mood is not anxious or depressed. Affect is not labile, blunt, angry or inappropriate.        Speech: Speech normal.        Behavior: Behavior normal.        Thought Content: Thought content  normal. Thought content is not paranoid or delusional. Thought content does not include homicidal or suicidal ideation. Thought content does not include homicidal or suicidal plan.        Cognition and Memory: Cognition and memory normal.        Judgment: Judgment normal.     Comments: Insight intact     Lab Review:     Component Value Date/Time   NA 138 08/12/2014 1220   K 4.8 08/12/2014 1220   CL 100 08/12/2014 1220   CO2 27 08/12/2014 1220   GLUCOSE 76 08/12/2014 1220   BUN 14 08/12/2014 1220   CREATININE 0.56 08/12/2014 1220   CALCIUM 9.2 08/12/2014 1220   PROT 7.3 08/12/2014 1220   ALBUMIN 3.7 08/12/2014 1220   AST 18 08/12/2014 1220   ALT 17 08/12/2014 1220   ALKPHOS 76 08/12/2014 1220   BILITOT 0.3 08/12/2014 1220   GFRNONAA >90 08/12/2014 1220   GFRAA >90 08/12/2014 1220       Component Value Date/Time   WBC 5.3 04/26/2018 1411   WBC 7.4 08/12/2014 1220   RBC 4.27 04/26/2018 1411   RBC 4.42 08/12/2014 1220   HGB 12.0 04/26/2018 1411   HCT 36.6 04/26/2018 1411   PLT 146 (L) 04/26/2018 1411   MCV 86 04/26/2018 1411   MCH 28.1 04/26/2018 1411   MCH 28.5 08/12/2014 1220   MCHC 32.8 04/26/2018 1411   MCHC 32.0 08/12/2014 1220   RDW 13.4 04/26/2018 1411   LYMPHSABS 1.3 04/26/2018 1411   MONOABS 0.6 08/12/2014 1220   EOSABS 0.2 04/26/2018 1411   BASOSABS 0.0 04/26/2018 1411    No results found for: POCLITH, LITHIUM   No results found for: PHENYTOIN, PHENOBARB, VALPROATE, CBMZ   .res Assessment: Plan:     Plan:  PDMP reviewed  1. Abilify 5mg  daily 2. Cymbalta 60mg  daily 3. Lorazepam 0.5mg  daily prn  4. Lexapro 20mg  daily  Taking Phentermine 37.5mg  daily through PCP  RTC 4 weeks  Patient advised to contact office with any questions, adverse effects, or acute worsening in signs and symptoms.  Discussed potential benefits, risk, and side effects of benzodiazepines to include potential risk of tolerance and dependence, as well as possible drowsiness.  Advised patient not to drive if experiencing drowsiness and to take lowest possible effective dose to minimize risk of dependence and tolerance.  Discussed potential metabolic side effects associated with atypical antipsychotics, as well as potential risk for movement side effects. Advised pt to contact office if movement side effects occur.     There are no diagnoses linked to this encounter.   Please see After Visit Summary for patient specific instructions.  Future Appointments  Date Time Provider Department Center  12/30/2019  8:30 AM , NP [facility] None  12/30/2019 11:00 AM 02/29/2020, LCSW [facility] None  01/13/2020 11:00 AM 02/29/2020, LCSW [facility] None  01/27/2020 11:00 AM 01/15/2020, LCSW [facility] None  02/10/2020 11:00 AM 03/28/2020, LCSW [facility] None  02/24/2020 11:00 AM 02/12/2020, LCSW [facility] None  03/09/2020 11:00 AM 04/25/2020, LCSW [facility] None    No orders of the defined types were placed in this encounter.   -------------------------------

## 2019-12-26 NOTE — Progress Notes (Deleted)
PATIENT: Kristen Brown DOB: September 04, 1976  REASON FOR VISIT: follow up HISTORY FROM: patient  No chief complaint on file.    HISTORY OF PRESENT ILLNESS: Today 12/26/19 Kristen Brown is a 44 y.o. female here today for follow up for MS. She continues Tecfidera. MRI brain in 04/2018 showed 1-2 foci not present in 2014. Cervical spine showed 1 probable MS focus. She continues Abilify 5mg  daily for mood. phentermine daily for fatigue, weight management and inattention. Etodolac for pain. OSA?    HISTORY: (copied from Dr note on 01/17/2019)  She feels her MS is mostly stable.   She takes Tecfidera and still gets intermittent flushing, helped by aspirin.   MRI of the brain 05/11/2018 showed 1 or 2 more foci that were not present compared to the 2014 MRI including one in the middle cerebellar peduncle.  MRI of the cervical spine shows 1 probable MS focus.  She feels gait is fine though endurance is poor.  She gets a deep pain and also has an itch sensation in her legs.  There is some aching on her right side.    She has urinary urgency (sometimes bowel) and has occasional incontinence (bladder or bowel).  Vision is ok for the most part though she notes some blurriness when tired.    She is noting more fatigue.   She has some verbal fluency, occ with writing.    She sometimes notes reduced focus and attnetion.  She is more moody and gets aggravated easily.     Etodolac helps the leg pain some.  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     Migraines are doing ok and sumatriptan is helpful when one   Observations/Objective She is a well-developed well-nourished woman in no acute distress.  The head is normocephalic and atraumatic.  Sclera are anicteric.  Visible skin appears normal.  The neck has a good range of motion.  Pharynx and tongue have normal appearance.  She is alert and fully oriented with fluent speech and good  attention, knowledge and memory.  Extraocular muscles are intact.  Facial strength is normal.  Palatal elevation and tongue protrusion are midline.  She appears to have normal strength in the arms.  Rapid alternating movements and finger-nose-finger are performed well.  Assessment and Plan: Multiple sclerosis (HCC)  Numbness  Other fatigue  Depression, unspecified depression type  Attention deficit  1.  Continue Tecfidera for MS.  Take aspirin as needed for the flushing. 2.  Stay active and exercise as tolerated.  Renew etodolac for back pain and leg pain. 3.   Continue to help with weight loss, fatigue, focus/attention. 4.   Follow-up in 6 months or sooner if there are new or worsening neurologic symptoms.  Follow Up Instructions: I discussed the assessment and treatment plan with the patient. The patient was provided an opportunity to ask questions and all were answered. The patient agreed with the plan and demonstrated an understanding of the instructions.    The patient was advised to call back or seek an in-person evaluation if the symptoms worsen or if the condition fails to improve as anticipated.  I provided 26 minutes of non-face-to-face time during this encounter.  ________________________________________________________ From initial consultation: HISTORY OF PRESENT ILLNESS:  I had the pleasure of seeing your patient, Kristen Brown, at the MS center at [facility] for a neurologic consultation regarding her multiple sclerosis and more recent  change in numbness.  She is a 44 year old woman who was diagnosed with MS in November 2014.    In early 2014,, she had numbness in her legs that ws ust present one morning.  It improved over a few weeks and she did not see any doctors.    Later that year, she had diplopia that was preent one morning.   Symptoms took 6 months to mostly recover.   She feels worse if very tired or hot.   She had an MRi of the brain  showing multiple lesions c/with MS.   3 lesions enhanced.    She was referred to D.r Gaynelle Adu at [facility].   She started Tecfidera.    She needed to titrate slow;y due to GI upset when she tried a higher dose more rapidly.    She no longer has GI issues but has occasional flushing still that responds to aspirin.     Since starting the medication she did not had definite exacerbation until possibly earlier this year.     She has noted more numbness in the right arm form shoulder to finger and in her right leg, hip to toes.     She feels her walking is worse since the numbness started.   She has not noted improvement in the numbness compared to symptoms in the past that improved eventually.     She notes gait is doing ok but her endurance is worse.  If she had to, she could walk a mile but might feel worse the next day.   She notes the right leg is weaker than her left and sometimes gets cramps and spasms.  She had initially been on baclofen for the spasticity and more recently was switched to Flexeril.  She is doing better with the Flexeril.  She also notes numbness in the right arm and leg.    She has had much more urinary and bowel  urgency the past year than she used to.    She has no incontinence.    Vision is blurry.    A recent ophthalmology appt showed no ocular problems.      She notes fatigue is mild in the morning but worse by the end of the day.   She often lays down when she gets home.   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    She is sleeping well most nights.  She was diagnosed with mild obstructive sleep apnea earlier this year.  She does not use any CPAP and has been advised to lose weight.  Mood is mildly off with some depression.   She feels she gets agitated easily.    She feels cognition is mildly off.   She has trouble multitasking.    She has ADD so has had poor focus and attention.      She gets 2-3 migraines a month, helped by Imitrex.      REVIEW OF  SYSTEMS: Out of a complete 14 system review of symptoms, the patient complains only of the following symptoms, and all other reviewed systems are negative.  ALLERGIES: Allergies  Allergen Reactions  . Sulfa Antibiotics Hives  . Adhesive [Tape] Rash  . Latex Rash    HOME MEDICATIONS: Outpatient Medications Prior to Visit  Medication Sig Dispense Refill  . ARIPiprazole (ABILIFY) 5 MG tablet Take 1 tablet (5 mg total) by mouth daily. 90 tablet 0  . cyclobenzaprine (FLEXERIL) 10 MG tablet TAKE 1 TABLET  BY MOUTH AT BEDTIME AS NEEDED    . Dimethyl Fumarate 240 MG CPDR     . DULoxetine (CYMBALTA) 30 MG capsule 1 CAPSULE ONCE A DAY WITH 60MG  CAP ORALLY 90 DAYS    . DULoxetine (CYMBALTA) 60 MG capsule Take 90 mg by mouth daily.     escitalopram (LEXAPRO) 20 MG tablet Take 1 tablet (20 mg total) by mouth daily. 30 tablet 2  . etodolac (LODINE) 400 MG tablet TAKE 1 TAB 2 TIMES DAILY MAY NOT TAKE ADDITIONAL NSAID'S (INCLUDING IBUPROFEN) WHILE TAKING ETODOLAC 60 tablet 1  . ibuprofen (ADVIL,MOTRIN) 800 MG tablet Take 1 tablet (800 mg total) by mouth 3 (three) times daily. 21 tablet 0  . LORazepam (ATIVAN) 0.5 MG tablet Take 0.5 mg by mouth every 8 (eight) hours.    . magnesium gluconate (MAGONATE) 500 MG tablet Take 250 mg by mouth daily.    . phentermine 37.5 MG capsule TAKE 1 CAPSULE BY MOUTH EVERY MORNING 30 capsule 5  . SPRINTEC 28 0.25-35 MG-MCG tablet Take 1 tablet by mouth daily.  0  . SUMAtriptan (IMITREX) 50 MG tablet TAKE 1 TAB(S) ORALLY ONCE, CAN REPEAT 2 HOURS LATER IF NEEDED  0  . Vitamin D, Ergocalciferol, (DRISDOL) 50000 units CAPS capsule Take 50,000 Units by mouth once a week.  3   No facility-administered medications prior to visit.    PAST MEDICAL HISTORY: Past Medical History:  Diagnosis Date  . Anxiety   . Depression   . Gallstones 07/2014  . GERD (gastroesophageal reflux disease)    no current med.  . Irritable bowel syndrome (IBS)   . Migraines   . Multiple  sclerosis (HCC)   . Sinus infection 08/11/2014   will be finished with antibiotic prior to surgery  . Vision abnormalities     PAST SURGICAL HISTORY: Past Surgical History:  Procedure Laterality Date  . CHOLECYSTECTOMY N/A 08/19/2014   Procedure: LAPAROSCOPIC CHOLECYSTECTOMY WITH INTRAOPERATIVE CHOLANGIOGRAM;  Surgeon: 14/09/2013, MD;  Location: [facility];  Service: General;  Laterality: N/A;  . NO PAST SURGERIES      FAMILY HISTORY: Family History  Problem Relation Age of Onset  . Hyperlipidemia Mother   . Hyperlipidemia Father   . Diabetes Father   . Atrial fibrillation Father   . Cancer Paternal Grandfather        colon  . Healthy Brother   . Lung cancer Maternal Grandmother   . Diabetes Paternal Grandmother     SOCIAL HISTORY: Social History   Socioeconomic History  . Marital status: Single    Spouse name: Not on file  . Number of children: Not on file  . Years of education: Not on file  . Highest education level: Not on file  Occupational History  . Not on file  Tobacco Use  . Smoking status: Never Smoker  . Smokeless tobacco: Never Used  Substance and Sexual Activity  . Alcohol use: No  . Drug use: No  . Sexual activity: Not on file  Other Topics Concern  . Not on file  Social History Narrative  . Not on file   Social Determinants of Health   Financial Resource Strain:   . Difficulty of Paying Living Expenses:   Food Insecurity:   . Worried About Harriette Bouillon in the Last Year:   . Programme researcher, broadcasting/film/video in the Last Year:   Transportation Needs:   . Barista (Medical):   Freight forwarder Lack of Transportation (Non-Medical):  Physical Activity:   . Days of Exercise per Week:   . Minutes of Exercise per Session:   Stress:   . Feeling of Stress :   Social Connections:   . Frequency of Communication with Friends and Family:   . Frequency of Social Gatherings with Friends and Family:   . Attends Religious Services:   . Active  Member of Clubs or Organizations:   . Attends Banker Meetings:   Marland Kitchen Marital Status:   Intimate Partner Violence:   . Fear of Current or Ex-Partner:   . Emotionally Abused:   Marland Kitchen Physically Abused:   . Sexually Abused:       PHYSICAL EXAM  There were no vitals filed for this visit. There is no height or weight on file to calculate BMI.  Generalized: Well developed, in no acute distress  Cardiology: normal rate and rhythm, no murmur noted Neurological examination  Mentation: Alert oriented to time, place, history taking. Follows all commands speech and language fluent Cranial nerve II-XII: Pupils were equal round reactive to light. Extraocular movements were full, visual field were full on confrontational test. Facial sensation and strength were normal. Uvula tongue midline. Head turning and shoulder shrug  were normal and symmetric. Motor: The motor testing reveals 5 over 5 strength of all 4 extremities. Good symmetric motor tone is noted throughout.  Sensory: Sensory testing is intact to soft touch on all 4 extremities. No evidence of extinction is noted.  Coordination: Cerebellar testing reveals good finger-nose-finger and heel-to-shin bilaterally.  Gait and station: Gait is normal. Tandem gait is normal. Romberg is negative. No drift is seen.  Reflexes: Deep tendon reflexes are symmetric and normal bilaterally.   DIAGNOSTIC DATA (LABS, IMAGING, TESTING) - I reviewed patient records, labs, notes, testing and imaging myself where available.  No flowsheet data found.   Lab Results  Component Value Date   WBC 5.3 04/26/2018   HGB 12.0 04/26/2018   HCT 36.6 04/26/2018   MCV 86 04/26/2018   PLT 146 (L) 04/26/2018      Component Value Date/Time   NA 138 08/12/2014 1220   K 4.8 08/12/2014 1220   CL 100 08/12/2014 1220   CO2 27 08/12/2014 1220   GLUCOSE 76 08/12/2014 1220   BUN 14 08/12/2014 1220   CREATININE 0.56 08/12/2014 1220   CALCIUM 9.2 08/12/2014 1220    PROT 7.3 08/12/2014 1220   ALBUMIN 3.7 08/12/2014 1220   AST 18 08/12/2014 1220   ALT 17 08/12/2014 1220   ALKPHOS 76 08/12/2014 1220   BILITOT 0.3 08/12/2014 1220   GFRNONAA >90 08/12/2014 1220   GFRAA >90 08/12/2014 1220   No results found for: CHOL, HDL, LDLCALC, LDLDIRECT, TRIG, CHOLHDL No results found for: YIRS8N No results found for: VITAMINB12 No results found for: TSH     ASSESSMENT AND PLAN 44 y.o. year old female  has a past medical history of Anxiety, Depression, Gallstones (07/2014), GERD (gastroesophageal reflux disease), Irritable bowel syndrome (IBS), Migraines, Multiple sclerosis (HCC), Sinus infection (08/11/2014), and Vision abnormalities. here with ***    ICD-10-CM   1. Multiple sclerosis (HCC)  G35        No orders of the defined types were placed in this encounter.    No orders of the defined types were placed in this encounter.     I spent 15 minutes with the patient. 50% of this time was spent counseling and educating patient on plan of care and medications.    Graceanna Theissen  Ubaldo Glassing, FNP-C 12/26/2019, 4:33 PM [facility] [address], [place], [place] [address] [phone]

## 2019-12-30 ENCOUNTER — Ambulatory Visit (INDEPENDENT_AMBULATORY_CARE_PROVIDER_SITE_OTHER): Payer: 59 | Admitting: Psychology

## 2019-12-30 ENCOUNTER — Ambulatory Visit: Payer: Self-pay | Admitting: Family Medicine

## 2019-12-30 ENCOUNTER — Telehealth: Payer: Self-pay

## 2019-12-30 ENCOUNTER — Encounter: Payer: Self-pay | Admitting: Family Medicine

## 2019-12-30 DIAGNOSIS — F329 Major depressive disorder, single episode, unspecified: Secondary | ICD-10-CM | POA: Diagnosis not present

## 2019-12-30 NOTE — Telephone Encounter (Signed)
Patient no-showed today's appointment with NP 

## 2020-01-13 ENCOUNTER — Ambulatory Visit (INDEPENDENT_AMBULATORY_CARE_PROVIDER_SITE_OTHER): Payer: 59 | Admitting: Psychology

## 2020-01-13 ENCOUNTER — Telehealth: Payer: Self-pay | Admitting: Adult Health

## 2020-01-13 ENCOUNTER — Other Ambulatory Visit: Payer: Self-pay

## 2020-01-13 DIAGNOSIS — F329 Major depressive disorder, single episode, unspecified: Secondary | ICD-10-CM | POA: Diagnosis not present

## 2020-01-13 DIAGNOSIS — F32A Depression, unspecified: Secondary | ICD-10-CM

## 2020-01-13 DIAGNOSIS — F423 Hoarding disorder: Secondary | ICD-10-CM

## 2020-01-13 DIAGNOSIS — F429 Obsessive-compulsive disorder, unspecified: Secondary | ICD-10-CM

## 2020-01-13 MED ORDER — ESCITALOPRAM OXALATE 20 MG PO TABS: 20.0000 mg | ORAL_TABLET | Freq: Every day | ORAL | 0 refills | Status: DC

## 2020-01-13 NOTE — Telephone Encounter (Signed)
Kristen Brown called to report that her insurance is requiring a 90 day prescription for her Lexapro.  They won't cover just 30 day.  Please send in a new RX for 90 day.  Send to [facility] on [address].

## 2020-01-13 NOTE — Telephone Encounter (Signed)
Updated Rx sent

## 2020-01-20 ENCOUNTER — Ambulatory Visit: Payer: 59 | Admitting: Adult Health

## 2020-01-27 ENCOUNTER — Ambulatory Visit: Payer: No Typology Code available for payment source | Admitting: Psychology

## 2020-01-31 ENCOUNTER — Ambulatory Visit (INDEPENDENT_AMBULATORY_CARE_PROVIDER_SITE_OTHER): Payer: 59 | Admitting: Psychology

## 2020-01-31 DIAGNOSIS — F329 Major depressive disorder, single episode, unspecified: Secondary | ICD-10-CM | POA: Diagnosis not present

## 2020-02-03 ENCOUNTER — Ambulatory Visit (INDEPENDENT_AMBULATORY_CARE_PROVIDER_SITE_OTHER): Payer: 59 | Admitting: Psychology

## 2020-02-03 DIAGNOSIS — F4310 Post-traumatic stress disorder, unspecified: Secondary | ICD-10-CM

## 2020-02-10 ENCOUNTER — Ambulatory Visit: Payer: 59 | Admitting: Psychology

## 2020-02-10 ENCOUNTER — Ambulatory Visit: Payer: No Typology Code available for payment source | Admitting: Psychology

## 2020-02-10 ENCOUNTER — Ambulatory Visit (INDEPENDENT_AMBULATORY_CARE_PROVIDER_SITE_OTHER): Payer: 59 | Admitting: Psychology

## 2020-02-10 DIAGNOSIS — F4310 Post-traumatic stress disorder, unspecified: Secondary | ICD-10-CM

## 2020-02-17 ENCOUNTER — Ambulatory Visit: Payer: 59 | Admitting: Psychology

## 2020-02-21 ENCOUNTER — Ambulatory Visit (INDEPENDENT_AMBULATORY_CARE_PROVIDER_SITE_OTHER): Payer: No Typology Code available for payment source | Admitting: Psychology

## 2020-02-21 DIAGNOSIS — F4310 Post-traumatic stress disorder, unspecified: Secondary | ICD-10-CM

## 2020-02-24 ENCOUNTER — Ambulatory Visit: Payer: No Typology Code available for payment source | Admitting: Psychology

## 2020-02-24 ENCOUNTER — Ambulatory Visit (INDEPENDENT_AMBULATORY_CARE_PROVIDER_SITE_OTHER): Payer: No Typology Code available for payment source | Admitting: Psychology

## 2020-02-24 DIAGNOSIS — F4310 Post-traumatic stress disorder, unspecified: Secondary | ICD-10-CM

## 2020-03-02 ENCOUNTER — Ambulatory Visit: Payer: 59 | Admitting: Psychology

## 2020-03-06 ENCOUNTER — Ambulatory Visit: Payer: No Typology Code available for payment source | Admitting: Psychology

## 2020-03-09 ENCOUNTER — Ambulatory Visit: Payer: 59 | Admitting: Psychology

## 2020-03-09 ENCOUNTER — Ambulatory Visit: Payer: No Typology Code available for payment source | Admitting: Psychology

## 2020-03-13 ENCOUNTER — Other Ambulatory Visit: Payer: Self-pay | Admitting: Neurology

## 2020-03-16 ENCOUNTER — Ambulatory Visit (INDEPENDENT_AMBULATORY_CARE_PROVIDER_SITE_OTHER): Payer: No Typology Code available for payment source | Admitting: Psychology

## 2020-03-16 DIAGNOSIS — F4310 Post-traumatic stress disorder, unspecified: Secondary | ICD-10-CM | POA: Diagnosis not present

## 2020-03-27 ENCOUNTER — Ambulatory Visit (INDEPENDENT_AMBULATORY_CARE_PROVIDER_SITE_OTHER): Payer: No Typology Code available for payment source | Admitting: Psychology

## 2020-03-27 ENCOUNTER — Ambulatory Visit: Payer: 59 | Admitting: Psychology

## 2020-03-27 DIAGNOSIS — F4310 Post-traumatic stress disorder, unspecified: Secondary | ICD-10-CM | POA: Diagnosis not present

## 2020-03-31 ENCOUNTER — Other Ambulatory Visit: Payer: Self-pay | Admitting: Neurology

## 2020-04-01 ENCOUNTER — Ambulatory Visit (INDEPENDENT_AMBULATORY_CARE_PROVIDER_SITE_OTHER): Payer: No Typology Code available for payment source | Admitting: Psychology

## 2020-04-01 DIAGNOSIS — F4310 Post-traumatic stress disorder, unspecified: Secondary | ICD-10-CM

## 2020-04-06 ENCOUNTER — Ambulatory Visit: Payer: No Typology Code available for payment source | Admitting: Psychology

## 2020-04-12 ENCOUNTER — Other Ambulatory Visit: Payer: Self-pay | Admitting: Adult Health

## 2020-04-12 DIAGNOSIS — F429 Obsessive-compulsive disorder, unspecified: Secondary | ICD-10-CM

## 2020-04-12 DIAGNOSIS — F32A Depression, unspecified: Secondary | ICD-10-CM

## 2020-04-12 DIAGNOSIS — F423 Hoarding disorder: Secondary | ICD-10-CM

## 2020-04-12 NOTE — Telephone Encounter (Signed)
Last apt 04/05, was due back 4 weeks

## 2020-04-13 ENCOUNTER — Ambulatory Visit (INDEPENDENT_AMBULATORY_CARE_PROVIDER_SITE_OTHER): Payer: No Typology Code available for payment source | Admitting: Psychology

## 2020-04-13 DIAGNOSIS — F4310 Post-traumatic stress disorder, unspecified: Secondary | ICD-10-CM

## 2020-04-22 ENCOUNTER — Ambulatory Visit (INDEPENDENT_AMBULATORY_CARE_PROVIDER_SITE_OTHER): Payer: No Typology Code available for payment source | Admitting: Psychology

## 2020-04-22 DIAGNOSIS — F4310 Post-traumatic stress disorder, unspecified: Secondary | ICD-10-CM | POA: Diagnosis not present

## 2020-04-27 ENCOUNTER — Ambulatory Visit (INDEPENDENT_AMBULATORY_CARE_PROVIDER_SITE_OTHER): Payer: No Typology Code available for payment source | Admitting: Psychology

## 2020-04-27 DIAGNOSIS — F4310 Post-traumatic stress disorder, unspecified: Secondary | ICD-10-CM | POA: Diagnosis not present

## 2020-05-05 ENCOUNTER — Other Ambulatory Visit: Payer: Self-pay | Admitting: Adult Health

## 2020-05-05 ENCOUNTER — Other Ambulatory Visit: Payer: Self-pay | Admitting: Neurology

## 2020-05-05 DIAGNOSIS — F429 Obsessive-compulsive disorder, unspecified: Secondary | ICD-10-CM

## 2020-05-05 DIAGNOSIS — F32A Depression, unspecified: Secondary | ICD-10-CM

## 2020-05-05 DIAGNOSIS — F329 Major depressive disorder, single episode, unspecified: Secondary | ICD-10-CM

## 2020-05-05 DIAGNOSIS — F423 Hoarding disorder: Secondary | ICD-10-CM

## 2020-05-08 ENCOUNTER — Ambulatory Visit: Payer: No Typology Code available for payment source | Admitting: Psychology

## 2020-05-12 ENCOUNTER — Ambulatory Visit (INDEPENDENT_AMBULATORY_CARE_PROVIDER_SITE_OTHER): Payer: No Typology Code available for payment source | Admitting: Psychology

## 2020-05-12 DIAGNOSIS — F4310 Post-traumatic stress disorder, unspecified: Secondary | ICD-10-CM | POA: Diagnosis not present

## 2020-05-19 ENCOUNTER — Ambulatory Visit (INDEPENDENT_AMBULATORY_CARE_PROVIDER_SITE_OTHER): Payer: No Typology Code available for payment source | Admitting: Psychology

## 2020-05-19 DIAGNOSIS — F4310 Post-traumatic stress disorder, unspecified: Secondary | ICD-10-CM

## 2020-06-01 ENCOUNTER — Ambulatory Visit (INDEPENDENT_AMBULATORY_CARE_PROVIDER_SITE_OTHER): Payer: No Typology Code available for payment source | Admitting: Psychology

## 2020-06-01 DIAGNOSIS — F4310 Post-traumatic stress disorder, unspecified: Secondary | ICD-10-CM | POA: Diagnosis not present

## 2020-06-17 ENCOUNTER — Other Ambulatory Visit: Payer: Self-pay | Admitting: Neurology

## 2020-06-17 ENCOUNTER — Ambulatory Visit (INDEPENDENT_AMBULATORY_CARE_PROVIDER_SITE_OTHER): Payer: No Typology Code available for payment source | Admitting: Psychology

## 2020-06-17 DIAGNOSIS — F4310 Post-traumatic stress disorder, unspecified: Secondary | ICD-10-CM | POA: Diagnosis not present

## 2020-06-19 ENCOUNTER — Other Ambulatory Visit: Payer: Self-pay | Admitting: Neurology

## 2020-06-19 ENCOUNTER — Other Ambulatory Visit: Payer: Self-pay | Admitting: Adult Health

## 2020-06-19 DIAGNOSIS — F429 Obsessive-compulsive disorder, unspecified: Secondary | ICD-10-CM

## 2020-06-19 DIAGNOSIS — F423 Hoarding disorder: Secondary | ICD-10-CM

## 2020-06-19 DIAGNOSIS — F32A Depression, unspecified: Secondary | ICD-10-CM

## 2020-06-19 NOTE — Telephone Encounter (Signed)
Over due for f/u, last visit 12/2019  Please get scheduled

## 2020-06-22 ENCOUNTER — Ambulatory Visit: Payer: No Typology Code available for payment source | Admitting: Psychology

## 2020-06-29 ENCOUNTER — Ambulatory Visit (INDEPENDENT_AMBULATORY_CARE_PROVIDER_SITE_OTHER): Payer: No Typology Code available for payment source | Admitting: Psychology

## 2020-06-29 DIAGNOSIS — F4310 Post-traumatic stress disorder, unspecified: Secondary | ICD-10-CM | POA: Diagnosis not present

## 2020-07-09 ENCOUNTER — Ambulatory Visit (INDEPENDENT_AMBULATORY_CARE_PROVIDER_SITE_OTHER): Payer: No Typology Code available for payment source | Admitting: Psychology

## 2020-07-09 DIAGNOSIS — F4310 Post-traumatic stress disorder, unspecified: Secondary | ICD-10-CM | POA: Diagnosis not present

## 2020-07-16 ENCOUNTER — Ambulatory Visit (INDEPENDENT_AMBULATORY_CARE_PROVIDER_SITE_OTHER): Payer: No Typology Code available for payment source | Admitting: Psychology

## 2020-07-16 DIAGNOSIS — F4310 Post-traumatic stress disorder, unspecified: Secondary | ICD-10-CM | POA: Diagnosis not present

## 2020-07-23 ENCOUNTER — Ambulatory Visit (INDEPENDENT_AMBULATORY_CARE_PROVIDER_SITE_OTHER): Payer: No Typology Code available for payment source | Admitting: Psychology

## 2020-07-23 DIAGNOSIS — F4310 Post-traumatic stress disorder, unspecified: Secondary | ICD-10-CM | POA: Diagnosis not present

## 2020-07-29 NOTE — Progress Notes (Deleted)
No chief complaint on file.    HISTORY OF PRESENT ILLNESS: Today 07/29/20  Kristen Brown is a 44 y.o. female here today for follow up for RRMS. She continues dimethyl fumerate. Last MRI did not show any new lesions with comparison to [facility] imaging in 2015.   Etodolac 400mg  BID Abilify 5mg  Phentermine   HISTORY (copied from Dr note on 01/17/2019)  She feels her MS is mostly stable.   She takes Tecfidera and still gets intermittent flushing, helped by aspirin.   MRI of the brain 05/11/2018 showed 1 or 2 more foci that were not present compared to the 2014 MRI including one in the middle cerebellar peduncle.  MRI of the cervical spine shows 1 probable MS focus.  She feels gait is fine though endurance is poor.  She gets a deep pain and also has an itch sensation in her legs.  There is some aching on her right side.    She has urinary urgency (sometimes bowel) and has occasional incontinence (bladder or bowel).  Vision is ok for the most part though she notes some blurriness when tired.    She is noting more fatigue.   She has some verbal fluency, occ with writing.    She sometimes notes reduced focus and attnetion.  She is more moody and gets aggravated easily.     Etodolac helps the leg pain some.  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     Migraines are doing ok and sumatriptan is helpful when one      REVIEW OF SYSTEMS: Out of a complete 14 system review of symptoms, the patient complains only of the following symptoms, and all other reviewed systems are negative.   ALLERGIES: Allergies  Allergen Reactions  . Sulfa Antibiotics Hives  . Adhesive [Tape] Rash  . Latex Rash     HOME MEDICATIONS: Outpatient Medications Prior to Visit  Medication Sig Dispense Refill  . ARIPiprazole (ABILIFY) 5 MG tablet Take 1 tablet (5 mg total) by mouth daily. 90 tablet 0  . cyclobenzaprine (FLEXERIL) 10 MG tablet TAKE  1 TABLET BY MOUTH AT BEDTIME AS NEEDED    . Dimethyl Fumarate 240 MG CPDR     . DULoxetine (CYMBALTA) 30 MG capsule 1 CAPSULE ONCE A DAY WITH 60MG  CAP ORALLY 90 DAYS    . DULoxetine (CYMBALTA) 60 MG capsule Take 90 mg by mouth daily.     01/19/2019 escitalopram (LEXAPRO) 20 MG tablet TAKE 1 TABLET BY MOUTH EVERY DAY 90 tablet 0  . etodolac (LODINE) 400 MG tablet TAKE 1 TAB 2 TIMES DAILY MAY NOT TAKE ADDITIONAL NSAID'S (INCLUDING IBUPROFEN) WHILE TAKING ETODOLAC 60 tablet 1  . ibuprofen (ADVIL,MOTRIN) 800 MG tablet Take 1 tablet (800 mg total) by mouth 3 (three) times daily. 21 tablet 0  . LORazepam (ATIVAN) 0.5 MG tablet Take 0.5 mg by mouth every 8 (eight) hours.    . magnesium gluconate (MAGONATE) 500 MG tablet Take 250 mg by mouth daily.    . phentermine 37.5 MG capsule TAKE 1 CAPSULE BY MOUTH EVERY MORNING 30 capsule 5  . SPRINTEC 28 0.25-35 MG-MCG tablet Take 1 tablet by mouth daily.  0  . SUMAtriptan (IMITREX) 50 MG tablet TAKE 1 TAB(S) ORALLY ONCE, CAN REPEAT 2 HOURS LATER IF NEEDED  0  . Vitamin D, Ergocalciferol, (DRISDOL) 50000 units CAPS capsule Take 50,000 Units by mouth once a week.  3  No facility-administered medications prior to visit.     PAST MEDICAL HISTORY: Past Medical History:  Diagnosis Date  . Anxiety   . Depression   . Gallstones 07/2014  . GERD (gastroesophageal reflux disease)    no current med.  . Irritable bowel syndrome (IBS)   . Migraines   . Multiple sclerosis (HCC)   . Sinus infection 08/11/2014   will be finished with antibiotic prior to surgery  . Vision abnormalities      PAST SURGICAL HISTORY: Past Surgical History:  Procedure Laterality Date  . CHOLECYSTECTOMY N/A 08/19/2014   Procedure: LAPAROSCOPIC CHOLECYSTECTOMY WITH INTRAOPERATIVE CHOLANGIOGRAM;  Surgeon: Harriette Bouillon, MD;  Location: [facility];  Service: General;  Laterality: N/A;  . NO PAST SURGERIES       FAMILY HISTORY: Family History  Problem Relation Age of Onset   . Hyperlipidemia Mother   . Hyperlipidemia Father   . Diabetes Father   . Atrial fibrillation Father   . Cancer Paternal Grandfather        colon  . Healthy Brother   . Lung cancer Maternal Grandmother   . Diabetes Paternal Grandmother      SOCIAL HISTORY: Social History   Socioeconomic History  . Marital status: Single    Spouse name: Not on file  . Number of children: Not on file  . Years of education: Not on file  . Highest education level: Not on file  Occupational History  . Not on file  Tobacco Use  . Smoking status: Never Smoker  . Smokeless tobacco: Never Used  Substance and Sexual Activity  . Alcohol use: No  . Drug use: No  . Sexual activity: Not on file  Other Topics Concern  . Not on file  Social History Narrative  . Not on file   Social Determinants of Health   Financial Resource Strain:   . Difficulty of Paying Living Expenses: Not on file  Food Insecurity:   . Worried About Programme researcher, broadcasting/film/video in the Last Year: Not on file  . Ran Out of Food in the Last Year: Not on file  Transportation Needs:   . Lack of Transportation (Medical): Not on file  . Lack of Transportation (Non-Medical): Not on file  Physical Activity:   . Days of Exercise per Week: Not on file  . Minutes of Exercise per Session: Not on file  Stress:   . Feeling of Stress : Not on file  Social Connections:   . Frequency of Communication with Friends and Family: Not on file  . Frequency of Social Gatherings with Friends and Family: Not on file  . Attends Religious Services: Not on file  . Active Member of Clubs or Organizations: Not on file  . Attends Banker Meetings: Not on file  . Marital Status: Not on file  Intimate Partner Violence:   . Fear of Current or Ex-Partner: Not on file  . Emotionally Abused: Not on file  . Physically Abused: Not on file  . Sexually Abused: Not on file      PHYSICAL EXAM  There were no vitals filed for this visit. There is no  height or weight on file to calculate BMI.   Generalized: Well developed, in no acute distress   Neurological examination  Mentation: Alert oriented to time, place, history taking. Follows all commands speech and language fluent Cranial nerve II-XII: Pupils were equal round reactive to light. Extraocular movements were full, visual field were full on confrontational  test. Facial sensation and strength were normal. Uvula tongue midline. Head turning and shoulder shrug  were normal and symmetric. Motor: The motor testing reveals 5 over 5 strength of all 4 extremities. Good symmetric motor tone is noted throughout.  Sensory: Sensory testing is intact to soft touch on all 4 extremities. No evidence of extinction is noted.  Coordination: Cerebellar testing reveals good finger-nose-finger and heel-to-shin bilaterally.  Gait and station: Gait is normal. Tandem gait is normal. Romberg is negative. No drift is seen.  Reflexes: Deep tendon reflexes are symmetric and normal bilaterally.     DIAGNOSTIC DATA (LABS, IMAGING, TESTING) - I reviewed patient records, labs, notes, testing and imaging myself where available.  Lab Results  Component Value Date   WBC 5.3 04/26/2018   HGB 12.0 04/26/2018   HCT 36.6 04/26/2018   MCV 86 04/26/2018   PLT 146 (L) 04/26/2018      Component Value Date/Time   NA 138 08/12/2014 1220   K 4.8 08/12/2014 1220   CL 100 08/12/2014 1220   CO2 27 08/12/2014 1220   GLUCOSE 76 08/12/2014 1220   BUN 14 08/12/2014 1220   CREATININE 0.56 08/12/2014 1220   CALCIUM 9.2 08/12/2014 1220   PROT 7.3 08/12/2014 1220   ALBUMIN 3.7 08/12/2014 1220   AST 18 08/12/2014 1220   ALT 17 08/12/2014 1220   ALKPHOS 76 08/12/2014 1220   BILITOT 0.3 08/12/2014 1220   GFRNONAA >90 08/12/2014 1220   GFRAA >90 08/12/2014 1220   No results found for: CHOL, HDL, LDLCALC, LDLDIRECT, TRIG, CHOLHDL No results found for: XNTZ0Y No results found for: VITAMINB12 No results found for:  TSH    ASSESSMENT AND PLAN  44 y.o. year old female  has a past medical history of Anxiety, Depression, Gallstones (07/2014), GERD (gastroesophageal reflux disease), Irritable bowel syndrome (IBS), Migraines, Multiple sclerosis (HCC), Sinus infection (08/11/2014), and Vision abnormalities. here with ***  No diagnosis found.   I spent 20 minutes of face-to-face and non-face-to-face time with patient.  This included previsit chart review, lab review, study review, order entry, electronic health record documentation, patient education.    Shawnie Dapper, MSN, FNP-C 07/29/2020, 1:52 PM  [facility] [address] [place], [place] [address] [phone]

## 2020-07-30 ENCOUNTER — Ambulatory Visit (INDEPENDENT_AMBULATORY_CARE_PROVIDER_SITE_OTHER): Payer: No Typology Code available for payment source | Admitting: Psychology

## 2020-07-30 DIAGNOSIS — F4310 Post-traumatic stress disorder, unspecified: Secondary | ICD-10-CM | POA: Diagnosis not present

## 2020-08-03 ENCOUNTER — Ambulatory Visit: Payer: 59 | Admitting: Family Medicine

## 2020-08-03 ENCOUNTER — Encounter: Payer: Self-pay | Admitting: Family Medicine

## 2020-08-03 DIAGNOSIS — G35 Multiple sclerosis: Secondary | ICD-10-CM

## 2020-08-03 DIAGNOSIS — R5383 Other fatigue: Secondary | ICD-10-CM

## 2020-08-03 DIAGNOSIS — F32A Depression, unspecified: Secondary | ICD-10-CM

## 2020-08-03 DIAGNOSIS — R41840 Attention and concentration deficit: Secondary | ICD-10-CM

## 2020-08-06 ENCOUNTER — Ambulatory Visit (INDEPENDENT_AMBULATORY_CARE_PROVIDER_SITE_OTHER): Payer: No Typology Code available for payment source | Admitting: Psychology

## 2020-08-06 DIAGNOSIS — F4310 Post-traumatic stress disorder, unspecified: Secondary | ICD-10-CM

## 2020-08-12 ENCOUNTER — Ambulatory Visit (INDEPENDENT_AMBULATORY_CARE_PROVIDER_SITE_OTHER): Payer: No Typology Code available for payment source | Admitting: Psychology

## 2020-08-12 DIAGNOSIS — F4310 Post-traumatic stress disorder, unspecified: Secondary | ICD-10-CM | POA: Diagnosis not present

## 2020-08-17 ENCOUNTER — Ambulatory Visit (INDEPENDENT_AMBULATORY_CARE_PROVIDER_SITE_OTHER): Payer: No Typology Code available for payment source | Admitting: Psychology

## 2020-08-17 DIAGNOSIS — F4310 Post-traumatic stress disorder, unspecified: Secondary | ICD-10-CM | POA: Diagnosis not present

## 2020-08-25 ENCOUNTER — Ambulatory Visit (INDEPENDENT_AMBULATORY_CARE_PROVIDER_SITE_OTHER): Payer: No Typology Code available for payment source | Admitting: Psychology

## 2020-08-25 DIAGNOSIS — F4310 Post-traumatic stress disorder, unspecified: Secondary | ICD-10-CM | POA: Diagnosis not present

## 2020-09-03 ENCOUNTER — Ambulatory Visit (INDEPENDENT_AMBULATORY_CARE_PROVIDER_SITE_OTHER): Payer: No Typology Code available for payment source | Admitting: Psychology

## 2020-09-03 DIAGNOSIS — F4310 Post-traumatic stress disorder, unspecified: Secondary | ICD-10-CM | POA: Diagnosis not present

## 2020-09-07 ENCOUNTER — Ambulatory Visit (INDEPENDENT_AMBULATORY_CARE_PROVIDER_SITE_OTHER): Payer: No Typology Code available for payment source | Admitting: Psychology

## 2020-09-07 DIAGNOSIS — F4310 Post-traumatic stress disorder, unspecified: Secondary | ICD-10-CM

## 2020-09-08 ENCOUNTER — Ambulatory Visit: Payer: No Typology Code available for payment source | Admitting: Psychology

## 2020-09-17 ENCOUNTER — Ambulatory Visit (INDEPENDENT_AMBULATORY_CARE_PROVIDER_SITE_OTHER): Payer: No Typology Code available for payment source | Admitting: Psychology

## 2020-09-17 DIAGNOSIS — F4310 Post-traumatic stress disorder, unspecified: Secondary | ICD-10-CM | POA: Diagnosis not present

## 2020-09-24 ENCOUNTER — Ambulatory Visit (INDEPENDENT_AMBULATORY_CARE_PROVIDER_SITE_OTHER): Payer: No Typology Code available for payment source | Admitting: Psychology

## 2020-09-24 ENCOUNTER — Other Ambulatory Visit: Payer: Self-pay | Admitting: Family Medicine

## 2020-09-24 DIAGNOSIS — F4310 Post-traumatic stress disorder, unspecified: Secondary | ICD-10-CM | POA: Diagnosis not present

## 2020-09-24 DIAGNOSIS — Z1231 Encounter for screening mammogram for malignant neoplasm of breast: Secondary | ICD-10-CM

## 2020-09-28 ENCOUNTER — Ambulatory Visit: Admission: RE | Admit: 2020-09-28 | Payer: No Typology Code available for payment source | Source: Ambulatory Visit

## 2020-09-28 ENCOUNTER — Ambulatory Visit (INDEPENDENT_AMBULATORY_CARE_PROVIDER_SITE_OTHER): Payer: No Typology Code available for payment source | Admitting: Psychology

## 2020-09-28 DIAGNOSIS — F4310 Post-traumatic stress disorder, unspecified: Secondary | ICD-10-CM | POA: Diagnosis not present

## 2020-09-28 DIAGNOSIS — Z1231 Encounter for screening mammogram for malignant neoplasm of breast: Secondary | ICD-10-CM

## 2020-10-07 ENCOUNTER — Ambulatory Visit (INDEPENDENT_AMBULATORY_CARE_PROVIDER_SITE_OTHER): Payer: No Typology Code available for payment source | Admitting: Psychology

## 2020-10-07 DIAGNOSIS — F4310 Post-traumatic stress disorder, unspecified: Secondary | ICD-10-CM

## 2020-10-15 ENCOUNTER — Ambulatory Visit (INDEPENDENT_AMBULATORY_CARE_PROVIDER_SITE_OTHER): Payer: No Typology Code available for payment source | Admitting: Psychology

## 2020-10-15 DIAGNOSIS — F4310 Post-traumatic stress disorder, unspecified: Secondary | ICD-10-CM | POA: Diagnosis not present

## 2020-10-22 ENCOUNTER — Ambulatory Visit: Payer: No Typology Code available for payment source | Admitting: Psychology

## 2020-10-29 ENCOUNTER — Ambulatory Visit: Payer: No Typology Code available for payment source | Admitting: Psychology

## 2020-10-30 ENCOUNTER — Ambulatory Visit (INDEPENDENT_AMBULATORY_CARE_PROVIDER_SITE_OTHER): Payer: No Typology Code available for payment source | Admitting: Psychology

## 2020-10-30 DIAGNOSIS — F4310 Post-traumatic stress disorder, unspecified: Secondary | ICD-10-CM

## 2020-11-11 ENCOUNTER — Other Ambulatory Visit: Payer: Self-pay | Admitting: Family Medicine

## 2020-11-11 DIAGNOSIS — Z1231 Encounter for screening mammogram for malignant neoplasm of breast: Secondary | ICD-10-CM

## 2020-11-12 ENCOUNTER — Ambulatory Visit (INDEPENDENT_AMBULATORY_CARE_PROVIDER_SITE_OTHER): Payer: No Typology Code available for payment source | Admitting: Psychology

## 2020-11-12 DIAGNOSIS — F4310 Post-traumatic stress disorder, unspecified: Secondary | ICD-10-CM

## 2020-11-16 ENCOUNTER — Other Ambulatory Visit: Payer: Self-pay | Admitting: Family Medicine

## 2020-11-16 DIAGNOSIS — Z1231 Encounter for screening mammogram for malignant neoplasm of breast: Secondary | ICD-10-CM

## 2020-11-19 ENCOUNTER — Ambulatory Visit (INDEPENDENT_AMBULATORY_CARE_PROVIDER_SITE_OTHER): Payer: No Typology Code available for payment source | Admitting: Psychology

## 2020-11-19 DIAGNOSIS — F4310 Post-traumatic stress disorder, unspecified: Secondary | ICD-10-CM

## 2020-11-26 ENCOUNTER — Ambulatory Visit (INDEPENDENT_AMBULATORY_CARE_PROVIDER_SITE_OTHER): Payer: No Typology Code available for payment source | Admitting: Psychology

## 2020-11-26 DIAGNOSIS — F4310 Post-traumatic stress disorder, unspecified: Secondary | ICD-10-CM

## 2020-11-30 ENCOUNTER — Ambulatory Visit (INDEPENDENT_AMBULATORY_CARE_PROVIDER_SITE_OTHER): Payer: No Typology Code available for payment source | Admitting: Psychology

## 2020-11-30 DIAGNOSIS — F4310 Post-traumatic stress disorder, unspecified: Secondary | ICD-10-CM

## 2020-12-07 ENCOUNTER — Ambulatory Visit (INDEPENDENT_AMBULATORY_CARE_PROVIDER_SITE_OTHER): Payer: No Typology Code available for payment source | Admitting: Psychology

## 2020-12-07 DIAGNOSIS — F331 Major depressive disorder, recurrent, moderate: Secondary | ICD-10-CM | POA: Diagnosis not present

## 2020-12-10 ENCOUNTER — Ambulatory Visit
Admission: RE | Admit: 2020-12-10 | Discharge: 2020-12-10 | Disposition: A | Discharge status: Home / Self Care | Payer: No Typology Code available for payment source | Source: Ambulatory Visit

## 2020-12-10 ENCOUNTER — Other Ambulatory Visit: Payer: Self-pay

## 2020-12-10 DIAGNOSIS — Z1231 Encounter for screening mammogram for malignant neoplasm of breast: Secondary | ICD-10-CM

## 2020-12-17 ENCOUNTER — Ambulatory Visit (INDEPENDENT_AMBULATORY_CARE_PROVIDER_SITE_OTHER): Payer: No Typology Code available for payment source | Admitting: Psychology

## 2020-12-17 DIAGNOSIS — F4310 Post-traumatic stress disorder, unspecified: Secondary | ICD-10-CM

## 2020-12-24 ENCOUNTER — Ambulatory Visit (INDEPENDENT_AMBULATORY_CARE_PROVIDER_SITE_OTHER): Payer: No Typology Code available for payment source | Admitting: Psychology

## 2020-12-24 DIAGNOSIS — F4310 Post-traumatic stress disorder, unspecified: Secondary | ICD-10-CM | POA: Diagnosis not present

## 2020-12-31 ENCOUNTER — Ambulatory Visit (INDEPENDENT_AMBULATORY_CARE_PROVIDER_SITE_OTHER): Payer: No Typology Code available for payment source | Admitting: Psychology

## 2020-12-31 DIAGNOSIS — F4310 Post-traumatic stress disorder, unspecified: Secondary | ICD-10-CM | POA: Diagnosis not present

## 2021-01-07 ENCOUNTER — Ambulatory Visit: Payer: Self-pay | Admitting: Neurology

## 2021-01-14 ENCOUNTER — Ambulatory Visit (INDEPENDENT_AMBULATORY_CARE_PROVIDER_SITE_OTHER): Payer: No Typology Code available for payment source | Admitting: Psychology

## 2021-01-14 DIAGNOSIS — F4310 Post-traumatic stress disorder, unspecified: Secondary | ICD-10-CM | POA: Diagnosis not present

## 2021-01-25 ENCOUNTER — Ambulatory Visit (INDEPENDENT_AMBULATORY_CARE_PROVIDER_SITE_OTHER): Payer: No Typology Code available for payment source | Admitting: Psychology

## 2021-01-25 DIAGNOSIS — F4310 Post-traumatic stress disorder, unspecified: Secondary | ICD-10-CM | POA: Diagnosis not present

## 2021-02-01 ENCOUNTER — Ambulatory Visit (INDEPENDENT_AMBULATORY_CARE_PROVIDER_SITE_OTHER): Payer: No Typology Code available for payment source | Admitting: Psychology

## 2021-02-01 DIAGNOSIS — F4310 Post-traumatic stress disorder, unspecified: Secondary | ICD-10-CM | POA: Diagnosis not present

## 2021-02-08 ENCOUNTER — Ambulatory Visit (INDEPENDENT_AMBULATORY_CARE_PROVIDER_SITE_OTHER): Payer: No Typology Code available for payment source | Admitting: Psychology

## 2021-02-08 DIAGNOSIS — F4310 Post-traumatic stress disorder, unspecified: Secondary | ICD-10-CM | POA: Diagnosis not present

## 2021-02-10 ENCOUNTER — Other Ambulatory Visit: Payer: Self-pay | Admitting: Neurology

## 2021-02-18 ENCOUNTER — Ambulatory Visit: Payer: No Typology Code available for payment source | Admitting: Neurology

## 2021-02-18 ENCOUNTER — Encounter: Payer: Self-pay | Admitting: Neurology

## 2021-02-18 ENCOUNTER — Ambulatory Visit (INDEPENDENT_AMBULATORY_CARE_PROVIDER_SITE_OTHER): Payer: No Typology Code available for payment source | Admitting: Psychology

## 2021-02-18 VITALS — BP 163/89 | HR 89 | Ht 70.0 in | Wt 330.5 lb

## 2021-02-18 DIAGNOSIS — G35 Multiple sclerosis: Secondary | ICD-10-CM

## 2021-02-18 DIAGNOSIS — R200 Anesthesia of skin: Secondary | ICD-10-CM | POA: Diagnosis not present

## 2021-02-18 DIAGNOSIS — G43009 Migraine without aura, not intractable, without status migrainosus: Secondary | ICD-10-CM | POA: Insufficient documentation

## 2021-02-18 DIAGNOSIS — F4310 Post-traumatic stress disorder, unspecified: Secondary | ICD-10-CM | POA: Diagnosis not present

## 2021-02-18 DIAGNOSIS — R5383 Other fatigue: Secondary | ICD-10-CM | POA: Diagnosis not present

## 2021-02-18 MED ORDER — DIMETHYL FUMARATE 240 MG PO CPDR
DELAYED_RELEASE_CAPSULE | ORAL | 11 refills | Status: DC
Start: 2021-02-18 — End: 2021-06-14

## 2021-02-18 NOTE — Addendum Note (Signed)
Addended by: Asa Lente on: 02/18/2021 06:42 PM   Modules accepted: Level of Service

## 2021-02-18 NOTE — Progress Notes (Addendum)
[facility]  PATIENT: Kristen Brown DOB: [birth year]  REFERRING DOCTOR OR PCP:  Sigmund Hazel, MD SOURCE: patient, notes from Dr. Hyacinth Meeker and Dr. Gaynelle Adu, imaging and lab reports, MRI images on PACS  _________________________________   HISTORICAL  CHIEF COMPLAINT:  Chief Complaint  Patient presents with  . Multiple Sclerosis    Routine Visit Room 13, alone in room   Update 02/18/2021: She is noting numbness in both legs up to the hips bilaterally that started in the feet and progressed over 2 day a week ago.   Symptoms have been the last week.  Balance, gait and strength are unchanged.    She also has had numbness in her hands x 1 month that also came on fairly suddenly.   No new bladder changes.  She has had a couple bouts of loose stools and bowel incontinence.  She has blurry vision bu no changes.   She takes Tecfidera (DMF).   She tolerates it well (had flushing when she stated Tecfidera)   MRI of the brain 05/11/2018 showed 1 or 2 more foci that were not present compared to the 2014 MRI including one in the middle cerebellar peduncle.  MRI of the cervical spine shows 1 probable MS focus.   She has mild fatigue. She sleeps well most nights.   She has some verbal fluency issues but nothing newg.    She sometimes notes reduced focus and attnetion.  Mood is ok though she has some anxiety.   She sees a Veterinary surgeon.    Migraines are doing well.  MS HISTORY: She was diagnosed with MS in November 2014.    In early 2014,, she had numbness in her legs that was ust present one morning.  It improved over a few weeks and she did not see any doctors.    Later that year, she had diplopia that was preent one morning.   Symptoms took 6 months to mostly recover.   She feels worse if very tired or hot.   She had an MRi of the brain showing multiple lesions c/with MS.   3 lesions enhanced.    She was referred to Dr Gaynelle Adu at [facility].   She started Tecfidera.    She needed to  titrate slow;y due to GI upset when she tried a higher dose more rapidly.    She no longer has GI issues but has occasional flushing still that responds to aspirin.     Since starting the medication she did not had definite exacerbation until possibly earlier this year.     MRI of the brain 05/11/2018 showed 1 or 2 more foci that were not present compared to the 2014 MRI including one in the middle cerebellar peduncle.  MRI of the cervical spine shows 1 probable MS focus.  REVIEW OF SYSTEMS: Constitutional: No fevers, chills, sweats, or change in appetite.   She has fatigue. Eyes: No visual changes, double vision, eye pain Ear, nose and throat: No hearing loss, ear pain, nasal congestion, sore throat Cardiovascular: No chest pain, palpitations Respiratory: No shortness of breath at rest or with exertion.   Mild OSA GastrointestinaI: No nausea, vomiting, diarrhea, abdominal pain, fecal incontinence Genitourinary: No dysuria, urinary retention or frequency.  No nocturia. Musculoskeletal: No neck pain, back pain Integumentary: No rash, pruritus, skin lesions Neurological: as above Psychiatric: No depression at this time.  No anxiety Endocrine: No palpitations, diaphoresis, change in appetite, change in weigh or increased thirst Hematologic/Lymphatic: No anemia, purpura, petechiae.  Allergic/Immunologic: No itchy/runny eyes, nasal congestion, recent allergic reactions, rashes  ALLERGIES: Allergies  Allergen Reactions  . Sulfa Antibiotics Hives  . Adhesive [Tape] Rash  . Latex Rash    HOME MEDICATIONS:  Current Outpatient Medications:  .  ARIPiprazole (ABILIFY) 5 MG tablet, Take 1 tablet (5 mg total) by mouth daily., Disp: 90 tablet, Rfl: 0 .  cyclobenzaprine (FLEXERIL) 10 MG tablet, TAKE 1 TABLET BY MOUTH AT BEDTIME AS NEEDED, Disp: , Rfl:  .  DULoxetine (CYMBALTA) 30 MG capsule, 1 CAPSULE ONCE A DAY WITH 60MG  CAP ORALLY 90 DAYS, Disp: , Rfl:  .  escitalopram (LEXAPRO) 20 MG tablet,  TAKE 1 TABLET BY MOUTH EVERY DAY, Disp: 90 tablet, Rfl: 0 .  etodolac (LODINE) 400 MG tablet, TAKE 1 TAB 2 TIMES DAILY MAY NOT TAKE ADDITIONAL NSAID'S (INCLUDING IBUPROFEN) WHILE TAKING ETODOLAC, Disp: 60 tablet, Rfl: 1 .  ibuprofen (ADVIL,MOTRIN) 800 MG tablet, Take 1 tablet (800 mg total) by mouth 3 (three) times daily., Disp: 21 tablet, Rfl: 0 .  LORazepam (ATIVAN) 0.5 MG tablet, Take 0.5 mg by mouth every 8 (eight) hours., Disp: , Rfl:  .  magnesium gluconate (MAGONATE) 500 MG tablet, Take 250 mg by mouth daily., Disp: , Rfl:  .  SUMAtriptan (IMITREX) 50 MG tablet, TAKE 1 TAB(S) ORALLY ONCE, CAN REPEAT 2 HOURS LATER IF NEEDED, Disp: , Rfl: 0 .  Vitamin D, Ergocalciferol, (DRISDOL) 50000 units CAPS capsule, Take 50,000 Units by mouth once a week., Disp: , Rfl: 3 .  Dimethyl Fumarate 240 MG CPDR, One po bid with food, Disp: 60 capsule, Rfl: 11 .  DULoxetine (CYMBALTA) 60 MG capsule, Take 90 mg by mouth daily. , Disp: , Rfl:  .  phentermine 37.5 MG capsule, TAKE 1 CAPSULE BY MOUTH EVERY MORNING, Disp: 30 capsule, Rfl: 5 .  SPRINTEC 28 0.25-35 MG-MCG tablet, Take 1 tablet by mouth daily., Disp: , Rfl: 0  PAST MEDICAL HISTORY: Past Medical History:  Diagnosis Date  . Anxiety   . Depression   . Gallstones 07/2014  . GERD (gastroesophageal reflux disease)    no current med.  . Irritable bowel syndrome (IBS)   . Migraines   . Multiple sclerosis (HCC)   . Sinus infection 08/11/2014   will be finished with antibiotic prior to surgery  . Vision abnormalities     PAST SURGICAL HISTORY: Past Surgical History:  Procedure Laterality Date  . CHOLECYSTECTOMY N/A 08/19/2014   Procedure: LAPAROSCOPIC CHOLECYSTECTOMY WITH INTRAOPERATIVE CHOLANGIOGRAM;  Surgeon: 14/09/2013, MD;  Location:  SURGERY CENTER;  Service: General;  Laterality: N/A;  . NO PAST SURGERIES      FAMILY HISTORY: Family History  Problem Relation Age of Onset  . Hyperlipidemia Mother   . Hyperlipidemia Father    . Diabetes Father   . Atrial fibrillation Father   . Cancer Paternal Grandfather        colon  . Healthy Brother   . Lung cancer Maternal Grandmother   . Diabetes Paternal Grandmother     SOCIAL HISTORY:  Social History   Socioeconomic History  . Marital status: Single    Spouse name: Not on file  . Number of children: Not on file  . Years of education: Not on file  . Highest education level: Not on file  Occupational History  . Not on file  Tobacco Use  . Smoking status: Never Smoker  . Smokeless tobacco: Never Used  Substance and Sexual Activity  . Alcohol use: No  . Drug  use: No  . Sexual activity: Not on file  Other Topics Concern  . Not on file  Social History Narrative  . Not on file   Social Determinants of Health   Financial Resource Strain: Not on file  Food Insecurity: Not on file  Transportation Needs: Not on file  Physical Activity: Not on file  Stress: Not on file  Social Connections: Not on file  Intimate Partner Violence: Not on file     PHYSICAL EXAM  Vitals:   02/18/21 1438  BP: (!) 163/89  Pulse: 89  Weight: (!) 330 lb 8 oz (149.9 kg)  Height: 5\' 10"  (1.778 m)    Body mass index is 47.42 kg/m.   General: The patient is well-developed and well-nourished and in no acute distress  Eyes:  Funduscopic exam shows normal optic discs and retinal vessels.  Neck: The neck is supple, no carotid bruits are noted.  The neck is nontender.  Cardiovascular: The heart has a regular rate and rhythm with a normal S1 and S2. There were no murmurs, gallops or rubs.   Skin: Extremities are without significant edema.  Musculoskeletal:  Back is nontender  Neurologic Exam  Mental status: The patient is alert and oriented x 3 at the time of the examination. The patient has apparent normal recent and remote memory, with an apparently normal attention span and concentration ability.   Speech is normal.  Cranial nerves: Extraocular movements are full.  Pupils are equal, round, and reactive to light and accomodation.  Visual fields are full.  Facial symmetry is present. There is good facial sensation to soft touch bilaterally.Facial strength is normal.  Trapezius and sternocleidomastoid strength is normal. No dysarthria is noted.  The tongue is midline, and the patient has symmetric elevation of the soft palate. No obvious hearing deficits are noted.  Motor:  Muscle bulk is normal.   Tone is normal. Strength is  5 / 5 in all 4 extremities.   Sensory: Sensory testing shows reduced vibration and temperature sensation in hte right arm and leg.   Touch was more sytmmetric.    Coordination: Cerebellar testing reveals good finger-nose-finger and reduced right heel-to-shin   Gait and station: Station is normal.   Gait is normal. Tandem gait is wide. Romberg is negative.   Reflexes: Deep tendon reflexes are symmetric and normal bilaterally.   Plantar responses are flexor.    DIAGNOSTIC DATA (LABS, IMAGING, TESTING) - I reviewed patient records, labs, notes, testing and imaging myself where available.  Lab Results  Component Value Date   WBC 5.3 04/26/2018   HGB 12.0 04/26/2018   HCT 36.6 04/26/2018   MCV 86 04/26/2018   PLT 146 (L) 04/26/2018      Component Value Date/Time   NA 138 08/12/2014 1220   K 4.8 08/12/2014 1220   CL 100 08/12/2014 1220   CO2 27 08/12/2014 1220   GLUCOSE 76 08/12/2014 1220   BUN 14 08/12/2014 1220   CREATININE 0.56 08/12/2014 1220   CALCIUM 9.2 08/12/2014 1220   PROT 7.3 08/12/2014 1220   ALBUMIN 3.7 08/12/2014 1220   AST 18 08/12/2014 1220   ALT 17 08/12/2014 1220   ALKPHOS 76 08/12/2014 1220   BILITOT 0.3 08/12/2014 1220   GFRNONAA >90 08/12/2014 1220   GFRAA >90 08/12/2014 1220      ASSESSMENT AND PLAN  Multiple sclerosis (HCC) - Plan: MR BRAIN W WO CONTRAST, MR CERVICAL SPINE W WO CONTRAST, CBC with Differential/Platelet  Numbness  OSA (obstructive  sleep apnea)  Other fatigue  Migraine  without aura and without status migrainosus, not intractable  1.   We discussed possibility of mild relapse.   Check MRI to determine if breakthrough changes.   If present, would recommend a change in therapy.  If MRI unchanged but another possible exacerbation occurs, would also switch.   As > one week since onset and symptoms very mild will not do steroids 2.  stay active and exercise as tolerated.  3.   Return in 6 months or sooner if there are new or worsening neurologic symptoms.  Kenishia Plack A. Epimenio Foot, MD, [facility] 02/18/2021, 6:38 PM Certified in Neurology, Clinical Neurophysiology, Sleep Medicine, Pain Medicine and Neuroimaging  [facility] [address] [place], [place] [address] [phone]

## 2021-02-19 LAB — CBC WITH DIFFERENTIAL/PLATELET
Basophils Absolute: 0 10*3/uL (ref 0.0–0.2)
Basos: 1 %
EOS (ABSOLUTE): 0.2 10*3/uL (ref 0.0–0.4)
Eos: 2 %
Hematocrit: 39.4 % (ref 34.0–46.6)
Hemoglobin: 12.6 g/dL (ref 11.1–15.9)
Immature Grans (Abs): 0 10*3/uL (ref 0.0–0.1)
Immature Granulocytes: 0 %
Lymphocytes Absolute: 1.9 10*3/uL (ref 0.7–3.1)
Lymphs: 21 %
MCH: 27.1 pg (ref 26.6–33.0)
MCHC: 32 g/dL (ref 31.5–35.7)
MCV: 85 fL (ref 79–97)
Monocytes Absolute: 0.5 10*3/uL (ref 0.1–0.9)
Monocytes: 6 %
Neutrophils Absolute: 6.2 10*3/uL (ref 1.4–7.0)
Neutrophils: 70 %
Platelets: 183 10*3/uL (ref 150–450)
RBC: 4.65 x10E6/uL (ref 3.77–5.28)
RDW: 13 % (ref 11.7–15.4)
WBC: 8.8 10*3/uL (ref 3.4–10.8)

## 2021-02-22 ENCOUNTER — Telehealth: Payer: Self-pay | Admitting: *Deleted

## 2021-02-22 NOTE — Telephone Encounter (Signed)
PA approved 02/22/2021-02/22/2022. PA# [employer] [id number]

## 2021-02-22 NOTE — Telephone Encounter (Signed)
Submitted PA dimethyl fumarate via fax to [facility] at [phone]. Received fax confirmation. Waiting on determination.

## 2021-03-01 NOTE — Telephone Encounter (Signed)
Called pt. Advised MD would like to bring her in for 3 days IV solumedrol. Pt agreeable to plan. She will come today at 2pm for first day. Liane,RN aware. Orders given.

## 2021-03-02 ENCOUNTER — Ambulatory Visit (INDEPENDENT_AMBULATORY_CARE_PROVIDER_SITE_OTHER): Payer: No Typology Code available for payment source | Admitting: Psychology

## 2021-03-02 DIAGNOSIS — F4310 Post-traumatic stress disorder, unspecified: Secondary | ICD-10-CM

## 2021-03-03 ENCOUNTER — Telehealth: Payer: Self-pay | Admitting: *Deleted

## 2021-03-03 NOTE — Telephone Encounter (Signed)
Pt here today for third day of IV steroids. Has not noticed much benefit per Selena Batten. Aware MD out until next Tuesday. Wanted to make doctor aware.

## 2021-03-06 ENCOUNTER — Ambulatory Visit
Admission: RE | Admit: 2021-03-06 | Discharge: 2021-03-06 | Disposition: A | Discharge status: Home / Self Care | Payer: No Typology Code available for payment source | Source: Ambulatory Visit | Attending: Neurology | Admitting: Neurology

## 2021-03-06 DIAGNOSIS — G35 Multiple sclerosis: Secondary | ICD-10-CM | POA: Diagnosis not present

## 2021-03-06 MED ORDER — GADOBENATE DIMEGLUMINE 529 MG/ML IV SOLN
19.0000 mL | Freq: Once | INTRAVENOUS | Status: AC | PRN
  Administered 2021-03-06: 19 mL via INTRAVENOUS

## 2021-03-08 ENCOUNTER — Telehealth: Payer: Self-pay | Admitting: Neurology

## 2021-03-08 ENCOUNTER — Ambulatory Visit (INDEPENDENT_AMBULATORY_CARE_PROVIDER_SITE_OTHER): Payer: No Typology Code available for payment source | Admitting: Psychology

## 2021-03-08 DIAGNOSIS — G35 Multiple sclerosis: Secondary | ICD-10-CM

## 2021-03-08 DIAGNOSIS — Z79899 Other long term (current) drug therapy: Secondary | ICD-10-CM | POA: Insufficient documentation

## 2021-03-08 DIAGNOSIS — F4310 Post-traumatic stress disorder, unspecified: Secondary | ICD-10-CM

## 2021-03-08 NOTE — Telephone Encounter (Signed)
I spoke to Kristen Brown to discuss  the MRI results and that she was having significant breakthrough activity.  Therefore, my recommendations would be to begin either Tysabri or Ocrevus.  I will check some blood work and she will sign both forms.  Based on the results we can get her started on 1 of these medications shortly.  She is in agreement with this plan.  She does feel that her symptoms that she was experiencing the last couple weeks are doing better since the steroids.

## 2021-03-08 NOTE — Telephone Encounter (Signed)
I called to go over the results of the MRI of the brain and cervical spine and got voicemail.  I left a message and will try to reach her again soon.  The MRI of the brain shows multiple T2/FLAIR hyperintense foci in the hemispheres, thalamus and brainstem and cerebellum.  5 of the foci in the cerebral hemispheres enhance after contrast consistent with acute demyelinating plaque.  Several other foci in the hemispheres were not present on the 2019 MRI consistent with breakthrough activity.  MRI of the cervical spine showed multiple foci, only 1 of which was present on the previous MRI.  3 foci (C4, C5 and C7) enhanced after contrast consistent with acute demyelinating plaque.  She is currently on dimethyl fumarate.  I will discuss with her the need to change to a different disease modifying therapy.  Due to the aggressiveness of the breakthrough, I will recommend either Tysabri or Ocrevus.  If she is unable to do IV therapy we could also consider an S1 P receptor modulator and [place]clad

## 2021-03-09 ENCOUNTER — Other Ambulatory Visit (INDEPENDENT_AMBULATORY_CARE_PROVIDER_SITE_OTHER): Payer: No Typology Code available for payment source

## 2021-03-09 DIAGNOSIS — G35 Multiple sclerosis: Secondary | ICD-10-CM

## 2021-03-09 DIAGNOSIS — Z0289 Encounter for other administrative examinations: Secondary | ICD-10-CM

## 2021-03-09 DIAGNOSIS — Z79899 Other long term (current) drug therapy: Secondary | ICD-10-CM

## 2021-03-13 LAB — COMPREHENSIVE METABOLIC PANEL
ALT: 14 IU/L (ref 0–32)
AST: 12 IU/L (ref 0–40)
Albumin/Globulin Ratio: 1.8 (ref 1.2–2.2)
Albumin: 4.4 g/dL (ref 3.8–4.8)
Alkaline Phosphatase: 85 IU/L (ref 44–121)
BUN/Creatinine Ratio: 16 (ref 9–23)
BUN: 10 mg/dL (ref 6–24)
Bilirubin Total: 0.3 mg/dL (ref 0.0–1.2)
CO2: 21 mmol/L (ref 20–29)
Calcium: 9.7 mg/dL (ref 8.7–10.2)
Chloride: 98 mmol/L (ref 96–106)
Creatinine, Ser: 0.64 mg/dL (ref 0.57–1.00)
Globulin, Total: 2.5 g/dL (ref 1.5–4.5)
Glucose: 106 mg/dL — ABNORMAL HIGH (ref 65–99)
Potassium: 3.7 mmol/L (ref 3.5–5.2)
Sodium: 139 mmol/L (ref 134–144)
Total Protein: 6.9 g/dL (ref 6.0–8.5)
eGFR: 111 mL/min/{1.73_m2} (ref >59)

## 2021-03-13 LAB — CBC WITH DIFFERENTIAL/PLATELET
Basophils Absolute: 0 10*3/uL (ref 0.0–0.2)
Basos: 0 %
EOS (ABSOLUTE): 0.2 10*3/uL (ref 0.0–0.4)
Eos: 2 %
Hematocrit: 42.3 % (ref 34.0–46.6)
Hemoglobin: 13.6 g/dL (ref 11.1–15.9)
Immature Grans (Abs): 0.2 10*3/uL — ABNORMAL HIGH (ref 0.0–0.1)
Immature Granulocytes: 2 %
Lymphocytes Absolute: 2.1 10*3/uL (ref 0.7–3.1)
Lymphs: 19 %
MCH: 26.6 pg (ref 26.6–33.0)
MCHC: 32.2 g/dL (ref 31.5–35.7)
MCV: 83 fL (ref 79–97)
Monocytes Absolute: 0.8 10*3/uL (ref 0.1–0.9)
Monocytes: 7 %
Neutrophils Absolute: 7.9 10*3/uL — ABNORMAL HIGH (ref 1.4–7.0)
Neutrophils: 70 %
Platelets: 181 10*3/uL (ref 150–450)
RBC: 5.11 x10E6/uL (ref 3.77–5.28)
RDW: 13.4 % (ref 11.7–15.4)
WBC: 11.2 10*3/uL — ABNORMAL HIGH (ref 3.4–10.8)

## 2021-03-13 LAB — QUANTIFERON-TB GOLD PLUS
QuantiFERON Mitogen Value: >10 IU/mL
QuantiFERON Nil Value: 0.16 IU/mL
QuantiFERON TB1 Ag Value: 0.17 IU/mL
QuantiFERON TB2 Ag Value: 0.18 IU/mL
QuantiFERON-TB Gold Plus: NEGATIVE

## 2021-03-13 LAB — HEPATITIS B CORE ANTIBODY, TOTAL: Hep B Core Total Ab: NEGATIVE

## 2021-03-13 LAB — IGG, IGA, IGM
IgA/Immunoglobulin A, Serum: 193 mg/dL (ref 87–352)
IgG (Immunoglobin G), Serum: 847 mg/dL (ref 586–1602)
IgM (Immunoglobulin M), Srm: 147 mg/dL (ref 26–217)

## 2021-03-13 LAB — HEPATITIS C ANTIBODY: Hep C Virus Ab: <0.1 s/co ratio (ref 0.0–0.9)

## 2021-03-13 LAB — VARICELLA ZOSTER ANTIBODY, IGG: Varicella zoster IgG: 1143 index (ref >165)

## 2021-03-13 LAB — HEPATITIS B SURFACE ANTIBODY,QUALITATIVE: Hep B Surface Ab, Qual: NONREACTIVE

## 2021-03-13 LAB — HIV ANTIBODY (ROUTINE TESTING W REFLEX): HIV Screen 4th Generation wRfx: NONREACTIVE

## 2021-03-15 ENCOUNTER — Ambulatory Visit (INDEPENDENT_AMBULATORY_CARE_PROVIDER_SITE_OTHER): Payer: No Typology Code available for payment source | Admitting: Psychology

## 2021-03-15 DIAGNOSIS — F4310 Post-traumatic stress disorder, unspecified: Secondary | ICD-10-CM

## 2021-03-16 ENCOUNTER — Other Ambulatory Visit: Payer: Self-pay

## 2021-03-16 ENCOUNTER — Telehealth: Payer: Self-pay | Admitting: Neurology

## 2021-03-16 ENCOUNTER — Other Ambulatory Visit: Payer: Self-pay | Admitting: *Deleted

## 2021-03-16 DIAGNOSIS — G35 Multiple sclerosis: Secondary | ICD-10-CM

## 2021-03-16 DIAGNOSIS — Z79899 Other long term (current) drug therapy: Secondary | ICD-10-CM

## 2021-03-16 NOTE — Telephone Encounter (Signed)
Called pt. Advised per Dr. Epimenio Foot he would like her to come in to have one more lab test: JCV ab. This will help determine if Tysabri is a safe option for her. She will come today around 4pm. I placed on lab scheduled. Placed future lab orders.   She did not sign start form for Ocrevus/Tysabri. She will sign these when she comes in for labs today.

## 2021-03-16 NOTE — Telephone Encounter (Signed)
Pt cancelled appt due to not being able to make it.

## 2021-03-16 NOTE — Telephone Encounter (Signed)
Called pt. R/s lab appt for tomorrow at 4pm.

## 2021-03-17 ENCOUNTER — Encounter: Payer: Self-pay | Admitting: Neurology

## 2021-03-17 ENCOUNTER — Telehealth: Payer: Self-pay | Admitting: *Deleted

## 2021-03-17 ENCOUNTER — Other Ambulatory Visit (INDEPENDENT_AMBULATORY_CARE_PROVIDER_SITE_OTHER): Payer: No Typology Code available for payment source

## 2021-03-17 DIAGNOSIS — G35 Multiple sclerosis: Secondary | ICD-10-CM

## 2021-03-17 DIAGNOSIS — Z0289 Encounter for other administrative examinations: Secondary | ICD-10-CM

## 2021-03-17 DIAGNOSIS — Z79899 Other long term (current) drug therapy: Secondary | ICD-10-CM

## 2021-03-17 NOTE — Telephone Encounter (Signed)
Placed JCV lab in [facility] lock box for routine lab pick up. Results pending. 

## 2021-03-18 LAB — HEPATITIS B SURFACE ANTIGEN: Hepatitis B Surface Ag: NEGATIVE

## 2021-03-23 ENCOUNTER — Telehealth: Payer: Self-pay

## 2021-03-23 NOTE — Telephone Encounter (Signed)
I spoke with Dr. Epimenio Foot.  Patient should washout from dimethyl fumarate for 3 weeks prior to starting Ocrevus.  I called patient to discuss.  No answer, left a voicemail asking her to call me back.  I will also send her a MyChart message.

## 2021-03-23 NOTE — Telephone Encounter (Signed)
JCV ab drawn on 03/17/21 positive, index: 1.14. Gave to MD to review.

## 2021-03-23 NOTE — Telephone Encounter (Signed)
Patient returned my call.  I advised her that Dr. Epimenio Foot would like her to stop taking dimethyl fumarate 3 weeks prior to starting Ocrevus.  Our infusion suite should be calling her in the next week or 2 to discuss this further.  Patient verbalized understanding.

## 2021-03-23 NOTE — Telephone Encounter (Signed)
I spoke with Dr. Epimenio Foot.  Since patient's JCV came back positive he would prefer the patient start Ocrevus.  I called patient.  I discussed this with her.  She is agreeable to starting Ocrevus.  She understands that our infusions we will be calling her once they obtain insurance authorization to schedule her first infusions.  Faxed start form for Ocrevus to [place].  Received a receipt of confirmation.  Ocrevus order given to Dr. Epimenio Foot for review.

## 2021-03-25 ENCOUNTER — Telehealth: Payer: Self-pay | Admitting: Neurology

## 2021-03-25 ENCOUNTER — Ambulatory Visit (INDEPENDENT_AMBULATORY_CARE_PROVIDER_SITE_OTHER): Payer: No Typology Code available for payment source | Admitting: Psychology

## 2021-03-25 DIAGNOSIS — F4310 Post-traumatic stress disorder, unspecified: Secondary | ICD-10-CM | POA: Diagnosis not present

## 2021-03-25 NOTE — Telephone Encounter (Signed)
FYI-Tammy from [facility] called stating that they have tried several attempts in reaching this pt regarding her Dimethyl Fumarate 240 MG CPDR and they have not been able to so she will be mailing a letter out to the pt today to see if she will be reachable that way. Tammy is just wanting to inform the provider to keep him updated.

## 2021-03-25 NOTE — Telephone Encounter (Signed)
Called pt. Informed her of message below from [facility]. She will call them to provide update. She has enough dimethyl fumarate on hand. Does not need refill. She is going to transition over to [employer]. Dr. Epimenio Foot wants her to stop dimethyl fumarate 3 wks prior to first Ocrevus infusion.

## 2021-04-08 ENCOUNTER — Ambulatory Visit (INDEPENDENT_AMBULATORY_CARE_PROVIDER_SITE_OTHER): Payer: No Typology Code available for payment source | Admitting: Psychology

## 2021-04-08 DIAGNOSIS — F4310 Post-traumatic stress disorder, unspecified: Secondary | ICD-10-CM

## 2021-04-15 ENCOUNTER — Ambulatory Visit (INDEPENDENT_AMBULATORY_CARE_PROVIDER_SITE_OTHER): Payer: No Typology Code available for payment source | Admitting: Psychology

## 2021-04-15 DIAGNOSIS — F4310 Post-traumatic stress disorder, unspecified: Secondary | ICD-10-CM

## 2021-04-22 ENCOUNTER — Ambulatory Visit (INDEPENDENT_AMBULATORY_CARE_PROVIDER_SITE_OTHER): Payer: No Typology Code available for payment source | Admitting: Psychology

## 2021-04-22 DIAGNOSIS — F4310 Post-traumatic stress disorder, unspecified: Secondary | ICD-10-CM

## 2021-04-26 ENCOUNTER — Ambulatory Visit (INDEPENDENT_AMBULATORY_CARE_PROVIDER_SITE_OTHER): Payer: No Typology Code available for payment source | Admitting: Psychology

## 2021-04-26 DIAGNOSIS — F4310 Post-traumatic stress disorder, unspecified: Secondary | ICD-10-CM | POA: Diagnosis not present

## 2021-05-03 NOTE — Telephone Encounter (Signed)
Patient had her first Ocrevus infusion on April 28, 2021.

## 2021-05-10 ENCOUNTER — Ambulatory Visit (INDEPENDENT_AMBULATORY_CARE_PROVIDER_SITE_OTHER): Payer: No Typology Code available for payment source | Admitting: Psychology

## 2021-05-10 DIAGNOSIS — F4310 Post-traumatic stress disorder, unspecified: Secondary | ICD-10-CM | POA: Diagnosis not present

## 2021-05-13 ENCOUNTER — Ambulatory Visit: Payer: No Typology Code available for payment source | Admitting: Psychology

## 2021-05-20 ENCOUNTER — Other Ambulatory Visit: Payer: Self-pay | Admitting: *Deleted

## 2021-05-20 ENCOUNTER — Ambulatory Visit (INDEPENDENT_AMBULATORY_CARE_PROVIDER_SITE_OTHER): Payer: No Typology Code available for payment source | Admitting: Psychology

## 2021-05-20 DIAGNOSIS — F4310 Post-traumatic stress disorder, unspecified: Secondary | ICD-10-CM | POA: Diagnosis not present

## 2021-05-20 MED ORDER — GABAPENTIN 300 MG PO CAPS: 300.0000 mg | ORAL_CAPSULE | Freq: Three times a day (TID) | ORAL | 11 refills | Status: DC

## 2021-05-27 ENCOUNTER — Ambulatory Visit (INDEPENDENT_AMBULATORY_CARE_PROVIDER_SITE_OTHER): Payer: No Typology Code available for payment source | Admitting: Psychology

## 2021-05-27 DIAGNOSIS — F4310 Post-traumatic stress disorder, unspecified: Secondary | ICD-10-CM | POA: Diagnosis not present

## 2021-06-03 ENCOUNTER — Ambulatory Visit (INDEPENDENT_AMBULATORY_CARE_PROVIDER_SITE_OTHER): Payer: No Typology Code available for payment source | Admitting: Psychology

## 2021-06-03 DIAGNOSIS — F4310 Post-traumatic stress disorder, unspecified: Secondary | ICD-10-CM

## 2021-06-10 ENCOUNTER — Ambulatory Visit (INDEPENDENT_AMBULATORY_CARE_PROVIDER_SITE_OTHER): Payer: No Typology Code available for payment source | Admitting: Psychology

## 2021-06-10 DIAGNOSIS — F4310 Post-traumatic stress disorder, unspecified: Secondary | ICD-10-CM

## 2021-06-14 NOTE — Addendum Note (Signed)
Addended by: Geronimo Running A on: 06/14/2021 03:58 PM   Modules accepted: Orders

## 2021-06-16 ENCOUNTER — Other Ambulatory Visit: Payer: Self-pay | Admitting: *Deleted

## 2021-06-16 DIAGNOSIS — M25559 Pain in unspecified hip: Secondary | ICD-10-CM

## 2021-06-17 ENCOUNTER — Ambulatory Visit (INDEPENDENT_AMBULATORY_CARE_PROVIDER_SITE_OTHER): Payer: No Typology Code available for payment source | Admitting: Psychology

## 2021-06-17 DIAGNOSIS — F4310 Post-traumatic stress disorder, unspecified: Secondary | ICD-10-CM | POA: Diagnosis not present

## 2021-06-23 ENCOUNTER — Ambulatory Visit: Payer: Self-pay

## 2021-06-23 ENCOUNTER — Other Ambulatory Visit: Payer: Self-pay

## 2021-06-23 ENCOUNTER — Ambulatory Visit (INDEPENDENT_AMBULATORY_CARE_PROVIDER_SITE_OTHER): Payer: No Typology Code available for payment source | Admitting: Orthopaedic Surgery

## 2021-06-23 ENCOUNTER — Encounter: Payer: Self-pay | Admitting: Orthopaedic Surgery

## 2021-06-23 DIAGNOSIS — M25551 Pain in right hip: Secondary | ICD-10-CM

## 2021-06-23 DIAGNOSIS — M25552 Pain in left hip: Secondary | ICD-10-CM | POA: Diagnosis not present

## 2021-06-23 DIAGNOSIS — Z6841 Body Mass Index (BMI) 40.0 and over, adult: Secondary | ICD-10-CM | POA: Diagnosis not present

## 2021-06-23 MED ORDER — DICLOFENAC SODIUM 75 MG PO TBEC: 75.0000 mg | DELAYED_RELEASE_TABLET | Freq: Two times a day (BID) | ORAL | 2 refills | Status: AC
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
## 2021-06-24 ENCOUNTER — Ambulatory Visit (INDEPENDENT_AMBULATORY_CARE_PROVIDER_SITE_OTHER): Payer: No Typology Code available for payment source | Admitting: Psychology

## 2021-06-24 DIAGNOSIS — F4310 Post-traumatic stress disorder, unspecified: Secondary | ICD-10-CM | POA: Diagnosis not present

## 2021-06-28 ENCOUNTER — Ambulatory Visit (INDEPENDENT_AMBULATORY_CARE_PROVIDER_SITE_OTHER): Payer: No Typology Code available for payment source | Admitting: Psychology

## 2021-06-28 DIAGNOSIS — F4310 Post-traumatic stress disorder, unspecified: Secondary | ICD-10-CM | POA: Diagnosis not present

## 2021-06-30 ENCOUNTER — Ambulatory Visit: Payer: No Typology Code available for payment source | Admitting: Physical Therapy

## 2021-07-15 ENCOUNTER — Ambulatory Visit (INDEPENDENT_AMBULATORY_CARE_PROVIDER_SITE_OTHER): Payer: No Typology Code available for payment source | Admitting: Psychology

## 2021-07-15 DIAGNOSIS — F4310 Post-traumatic stress disorder, unspecified: Secondary | ICD-10-CM | POA: Diagnosis not present

## 2021-07-22 ENCOUNTER — Ambulatory Visit: Payer: No Typology Code available for payment source | Admitting: Psychology

## 2021-07-27 ENCOUNTER — Ambulatory Visit (INDEPENDENT_AMBULATORY_CARE_PROVIDER_SITE_OTHER): Payer: No Typology Code available for payment source | Admitting: Psychology

## 2021-07-27 DIAGNOSIS — F4310 Post-traumatic stress disorder, unspecified: Secondary | ICD-10-CM | POA: Diagnosis not present

## 2021-08-05 ENCOUNTER — Ambulatory Visit: Payer: No Typology Code available for payment source | Admitting: Psychology

## 2021-08-19 ENCOUNTER — Ambulatory Visit (INDEPENDENT_AMBULATORY_CARE_PROVIDER_SITE_OTHER): Payer: No Typology Code available for payment source | Admitting: Psychology

## 2021-08-19 DIAGNOSIS — F4310 Post-traumatic stress disorder, unspecified: Secondary | ICD-10-CM | POA: Diagnosis not present

## 2021-08-30 ENCOUNTER — Ambulatory Visit (INDEPENDENT_AMBULATORY_CARE_PROVIDER_SITE_OTHER): Payer: No Typology Code available for payment source | Admitting: Neurology

## 2021-08-30 ENCOUNTER — Encounter: Payer: Self-pay | Admitting: Neurology

## 2021-08-30 ENCOUNTER — Other Ambulatory Visit: Payer: Self-pay

## 2021-08-30 VITALS — BP 162/81 | HR 78 | Ht 70.0 in | Wt 369.0 lb

## 2021-08-30 DIAGNOSIS — R269 Unspecified abnormalities of gait and mobility: Secondary | ICD-10-CM | POA: Diagnosis not present

## 2021-08-30 DIAGNOSIS — Z79899 Other long term (current) drug therapy: Secondary | ICD-10-CM

## 2021-08-30 DIAGNOSIS — R5383 Other fatigue: Secondary | ICD-10-CM

## 2021-08-30 DIAGNOSIS — R200 Anesthesia of skin: Secondary | ICD-10-CM

## 2021-08-30 DIAGNOSIS — G35 Multiple sclerosis: Secondary | ICD-10-CM

## 2021-08-30 NOTE — Progress Notes (Signed)
[facility]  PATIENT: Kristen Brown DOB: [birth year]  REFERRING DOCTOR OR PCP:  Sigmund Hazel, MD SOURCE: patient, notes from Dr. Hyacinth Meeker and Dr. Gaynelle Adu, imaging and lab reports, MRI images on PACS  _________________________________   HISTORICAL  CHIEF COMPLAINT:  Chief Complaint  Patient presents with   Follow-up    RM 2, alone. Ocrevus 300mg  doses were in August. Due in February 2023. Doing well.   Update 08/30/2021: She switched from DMF to Ocrevus and is tolerating it well.      Her next dose is February 2023.     She denies exacerbations or new symptoms.     At the last visit, she had new onset leg numbness and she is back to pre-June baseline with sone right leg walking.   She uses a cane but can walk ok most times without it.   MRI of the brain and cervical spine showed multiple enhancing foci plus additional T2 lesions not present on her previous MRI.  Therefore, she will switch from dimethyl fumarate to Ocrevus.  She has no falls.   She needs to hold the bannister on stairs (has needed to do this at least 2 years).  She has bowel > bladder urgency and has fecal incontinence x 2 and bladder x 1 in last 6 months.   She has blurry vision bu no changes.     She has mild fatigue. She sleeps well most nights.   She has some verbal fluency issues but nothing newg.    She sometimes notes reduced focus and attnetion.  Mood is ok though she has some anxiety.   She sees a Veterinary surgeon.    Migraines are doing well.  MS HISTORY: She was diagnosed with MS in November 2014.    In early 2014,, she had numbness in her legs that was ust present one morning.  It improved over a few weeks and she did not see any doctors.    Later that year, she had diplopia that was preent one morning.   Symptoms took 6 months to mostly recover.   She feels worse if very tired or hot.   She had an MRi of the brain showing multiple lesions c/with MS.   3 lesions enhanced.    She was referred  to Dr Gaynelle Adu at [facility].   She started Tecfidera.    She needed to titrate slow;y due to GI upset when she tried a higher dose more rapidly.    She no longer has GI issues but has occasional flushing still that responds to aspirin.     Since starting the medication she did not had definite exacerbation until possibly earlier this year.     MRI of the brain 05/11/2018 showed 1 or 2 more foci that were not present compared to the 2014 MRI including one in the middle cerebellar peduncle.  MRI of the cervical spine shows 1 probable MS focus.  MRI [facility] 03/06/21 showed multiple T2/FLAIR hyperintense foci in the cerebral hemispheres, thalamus, brainstem and left middle cerebellar peduncle in a pattern consistent with demyelinating plaque associated with multiple sclerosis.  5 foci in the cerebral hemispheres enhance after gadolinium contrast consistent with acute demyelinating plaque.  Several nonenhancing foci in the hemispheres, 2 foci in the thalamus and 1 in the midbrain are present on the current MRI but were not present on the 2019 MRI.  MRI cervical spine 03/06/2021 showed multiple T2 hyperintense foci within the spinal cord in a pattern consistent  with demyelination associated with multiple sclerosis.  All but one of the foci are new compared to the 2019 MRI..  3 foci at C4-C5 and C7 enhance after contrast.  This is consistent with acute demyelinating plaques.    REVIEW OF SYSTEMS: Constitutional: No fevers, chills, sweats, or change in appetite.   She has fatigue. Eyes: No visual changes, double vision, eye pain Ear, nose and throat: No hearing loss, ear pain, nasal congestion, sore throat Cardiovascular: No chest pain, palpitations Respiratory:  No shortness of breath at rest or with exertion.   Mild OSA GastrointestinaI: No nausea, vomiting, diarrhea, abdominal pain, fecal incontinence Genitourinary:  No dysuria, urinary retention or frequency.  No nocturia. Musculoskeletal:  No neck pain, back  pain Integumentary: No rash, pruritus, skin lesions Neurological: as above Psychiatric: No depression at this time.  No anxiety Endocrine: No palpitations, diaphoresis, change in appetite, change in weigh or increased thirst Hematologic/Lymphatic:  No anemia, purpura, petechiae. Allergic/Immunologic: No itchy/runny eyes, nasal congestion, recent allergic reactions, rashes  ALLERGIES: Allergies  Allergen Reactions   Sulfa Antibiotics Hives   Adhesive [Tape] Rash   Latex Rash    HOME MEDICATIONS:  Current Outpatient Medications:    diclofenac (VOLTAREN) 75 MG EC tablet, Take 1 tablet (75 mg total) by mouth 2 (two) times daily., Disp: 30 tablet, Rfl: 2   DULoxetine (CYMBALTA) 60 MG capsule, Take 60 mg by mouth daily., Disp: , Rfl:    escitalopram (LEXAPRO) 20 MG tablet, TAKE 1 TABLET BY MOUTH EVERY DAY, Disp: 90 tablet, Rfl: 0   gabapentin (NEURONTIN) 300 MG capsule, Take 1 capsule (300 mg total) by mouth 3 (three) times daily., Disp: 90 capsule, Rfl: 11   ibuprofen (ADVIL,MOTRIN) 800 MG tablet, Take 1 tablet (800 mg total) by mouth 3 (three) times daily., Disp: 21 tablet, Rfl: 0   LORazepam (ATIVAN) 0.5 MG tablet, Take 0.5 mg by mouth every 8 (eight) hours., Disp: , Rfl:    magnesium gluconate (MAGONATE) 500 MG tablet, Take 250 mg by mouth daily., Disp: , Rfl:    ocrelizumab (OCREVUS) 300 MG/10ML injection, Inject 600 mg into the vein every 6 (six) months., Disp: , Rfl:    SUMAtriptan (IMITREX) 50 MG tablet, TAKE 1 TAB(S) ORALLY ONCE, CAN REPEAT 2 HOURS LATER IF NEEDED, Disp: , Rfl: 0   Vitamin D, Ergocalciferol, (DRISDOL) 50000 units CAPS capsule, Take 50,000 Units by mouth once a week., Disp: , Rfl: 3   cyclobenzaprine (FLEXERIL) 10 MG tablet, TAKE 1 TABLET BY MOUTH AT BEDTIME AS NEEDED (Patient not taking: Reported on 08/30/2021), Disp: , Rfl:    etodolac (LODINE) 400 MG tablet, TAKE 1 TAB 2 TIMES DAILY MAY NOT TAKE ADDITIONAL NSAID'S (INCLUDING IBUPROFEN) WHILE TAKING ETODOLAC  (Patient not taking: Reported on 08/30/2021), Disp: 60 tablet, Rfl: 1  PAST MEDICAL HISTORY: Past Medical History:  Diagnosis Date   Anxiety    Depression    Gallstones 07/2014   GERD (gastroesophageal reflux disease)    no current med.   Irritable bowel syndrome (IBS)    Migraines    Multiple sclerosis (HCC)    Sinus infection 08/11/2014   will be finished with antibiotic prior to surgery   Vision abnormalities     PAST SURGICAL HISTORY: Past Surgical History:  Procedure Laterality Date   CHOLECYSTECTOMY N/A 08/19/2014   Procedure: LAPAROSCOPIC CHOLECYSTECTOMY WITH INTRAOPERATIVE CHOLANGIOGRAM;  Surgeon: Harriette Bouillon, MD;  Location: [facility];  Service: General;  Laterality: N/A;   NO PAST SURGERIES  FAMILY HISTORY: Family History  Problem Relation Age of Onset   Hyperlipidemia Mother    Hyperlipidemia Father    Diabetes Father    Atrial fibrillation Father    Cancer Paternal Grandfather        colon   Healthy Brother    Lung cancer Maternal Grandmother    Diabetes Paternal Grandmother     SOCIAL HISTORY:  Social History   Socioeconomic History   Marital status: Single    Spouse name: Not on file   Number of children: Not on file   Years of education: Not on file   Highest education level: Not on file  Occupational History   Not on file  Tobacco Use   Smoking status: Never   Smokeless tobacco: Never  Substance and Sexual Activity   Alcohol use: No   Drug use: No   Sexual activity: Not on file  Other Topics Concern   Not on file  Social History Narrative   Not on file   Social Determinants of Health   Financial Resource Strain: Not on file  Food Insecurity: Not on file  Transportation Needs: Not on file  Physical Activity: Not on file  Stress: Not on file  Social Connections: Not on file  Intimate Partner Violence: Not on file     PHYSICAL EXAM  Vitals:   08/30/21 0952  BP: (!) 162/81  Pulse: 78  Weight: (!) 369 lb  (167.4 kg)  Height: 5\' 10"  (1.778 m)    Body mass index is 52.95 kg/m.   General: The patient is well-developed and well-nourished and in no acute distress  Neck: The neck is supple, no carotid bruits are noted.  The neck is nontender.  Cardiovascular: The heart has a regular rate and rhythm with a normal S1 and S2. There were no murmurs, gallops or rubs.   Skin: Extremities are without significant edema.  Musculoskeletal:  Back is nontender  Neurologic Exam  Mental status: The patient is alert and oriented x 3 at the time of the examination. The patient has apparent normal recent and remote memory, with an apparently normal attention span and concentration ability.   Speech is normal.  Cranial nerves: Extraocular movements are full.  Facial strength and sensation was normal.  No dysarthria.  Hearing seems symmetric.  Motor:  Muscle bulk is normal.   Tone is normal. Strength is  5 / 5 in all 4 extremities.   Sensory: Sensory testing shows reduced vibration and temperature sensation in hte right arm and leg.   Touch was more sytmmetric.    Coordination: Cerebellar testing reveals good finger-nose-finger and reduced right heel-to-shin   Gait and station: Station is normal.   The gait was fairly normal.  Tandem gait was wide.. Romberg is negative.   Reflexes: Deep tendon reflexes are symmetric and normal bilaterally.   Plantar responses are flexor.    DIAGNOSTIC DATA (LABS, IMAGING, TESTING) - I reviewed patient records, labs, notes, testing and imaging myself where available.  Lab Results  Component Value Date   WBC 11.2 (H) 03/09/2021   HGB 13.6 03/09/2021   HCT 42.3 03/09/2021   MCV 83 03/09/2021   PLT 181 03/09/2021      Component Value Date/Time   NA 139 03/09/2021 1552   K 3.7 03/09/2021 1552   CL 98 03/09/2021 1552   CO2 21 03/09/2021 1552   GLUCOSE 106 (H) 03/09/2021 1552   GLUCOSE 76 08/12/2014 1220   BUN 10 03/09/2021 1552  CREATININE 0.64 03/09/2021  1552   CALCIUM 9.7 03/09/2021 1552   PROT 6.9 03/09/2021 1552   ALBUMIN 4.4 03/09/2021 1552   AST 12 03/09/2021 1552   ALT 14 03/09/2021 1552   ALKPHOS 85 03/09/2021 1552   BILITOT 0.3 03/09/2021 1552   GFRNONAA >90 08/12/2014 1220   GFRAA >90 08/12/2014 1220      ASSESSMENT AND PLAN  Multiple sclerosis (HCC) - Plan: MR BRAIN W WO CONTRAST  Gait disturbance - Plan: MR BRAIN W WO CONTRAST  Other fatigue  Numbness  High risk medication use  1.   She will continue Ocrevus.  She is tolerating it well and has not had any exacerbations since starting..   Before the next visit, we will check MRI to determine if breakthrough changes.   If breakthrough activity is present, would recommend a change in therapy.  Additionally, around the time of her next visit we will check CBC with differential and IgG 2.  stay active and exercise as tolerated.  3.   Return in 6 months or sooner if there are new or worsening neurologic symptoms.  Ankith Edmonston A. Epimenio Foot, MD, [facility] 08/30/2021, 8:07 PM Certified in Neurology, Clinical Neurophysiology, Sleep Medicine, Pain Medicine and Neuroimaging  [facility] [address] [place], [place] [address] [phone]

## 2021-08-31 ENCOUNTER — Ambulatory Visit (INDEPENDENT_AMBULATORY_CARE_PROVIDER_SITE_OTHER): Payer: No Typology Code available for payment source | Admitting: Psychology

## 2021-08-31 ENCOUNTER — Telehealth: Payer: Self-pay | Admitting: Neurology

## 2021-08-31 DIAGNOSIS — F4310 Post-traumatic stress disorder, unspecified: Secondary | ICD-10-CM | POA: Diagnosis not present

## 2021-08-31 NOTE — Telephone Encounter (Signed)
[employer] order sent to [facility] for them to schedule late May 2023.

## 2021-08-31 NOTE — Progress Notes (Signed)
[facility] Counselor/Therapist Progress Note  Patient ID: Kristen Brown, MRN: [id number],    Date: 08/31/2021  Time Spent: 60 minutes  Treatment Type: Individual Therapy  Reported Symptoms: sadness, frustration  Mental Status Exam: Appearance:  Casual     Behavior: Appropriate  Motor: Normal  Speech/Language:  Normal Rate  Affect: Depressed  Mood: depressed  Thought process: normal  Thought content:   WNL  Sensory/Perceptual disturbances:   WNL  Orientation: oriented to person, place, time/date, and situation  Attention: Good  Concentration: Good  Memory: WNL  Fund of knowledge:  Good  Insight:   Good  Judgment:  Good  Impulse Control: Good   Risk Assessment: Danger to Self:  No Self-injurious Behavior: No Danger to Others: No Duty to Warn:no Physical Aggression / Violence:No  Access to Firearms a concern: No  Gang Involvement:No   Subjective: The patient attended a face-to-face individual therapy session via video today.  The patient gave verbal consent for the video to be on WebEx.  The patient was alone in her home and the therapist was in the office.  The patient reports that she has been struggling this week because she found out that her MS has gotten worse.  The patient states that she also has been frustrated at work and would like to do something different because her job is so physical.  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  I reframed this for her so that she could be more positive about it.  We talked about how to move forward and see if this might be an option.  Interventions: Cognitive Behavioral Therapy  Diagnosis:PTSD (post-traumatic stress disorder)  Plan: Please see treatment plan in Therapy charts with target date of 02/02/2022.  Patient is scheduled for her next session within the next two weeks.   Ailin Rochford G Leslye Puccini, LCSW

## 2021-09-09 ENCOUNTER — Ambulatory Visit: Payer: No Typology Code available for payment source | Admitting: Psychology

## 2021-09-15 ENCOUNTER — Ambulatory Visit: Payer: No Typology Code available for payment source | Admitting: Psychology

## 2021-09-16 ENCOUNTER — Ambulatory Visit: Payer: No Typology Code available for payment source | Admitting: Psychology

## 2021-09-22 ENCOUNTER — Ambulatory Visit: Payer: No Typology Code available for payment source | Admitting: Psychology

## 2021-09-29 ENCOUNTER — Ambulatory Visit (INDEPENDENT_AMBULATORY_CARE_PROVIDER_SITE_OTHER): Payer: No Typology Code available for payment source | Admitting: Psychology

## 2021-09-29 DIAGNOSIS — F4310 Post-traumatic stress disorder, unspecified: Secondary | ICD-10-CM

## 2021-09-29 NOTE — Progress Notes (Signed)
[facility] Counselor/Therapist Progress Note  Patient ID: Kristen Brown, MRN: [id number],    Date: 09/29/2021  Time Spent: 45 minutes  Treatment Type: Individual Therapy  Reported Symptoms: sadness, frustration  Mental Status Exam: Appearance:  Casual     Behavior: Appropriate  Motor: Normal  Speech/Language:  Normal Rate  Affect: Depressed  Mood: depressed  Thought process: normal  Thought content:   WNL  Sensory/Perceptual disturbances:   WNL  Orientation: oriented to person, place, time/date, and situation  Attention: Good  Concentration: Good  Memory: WNL  Fund of knowledge:  Good  Insight:   Good  Judgment:  Good  Impulse Control: Good   Risk Assessment: Danger to Self:  No Self-injurious Behavior: No Danger to Others: No Duty to Warn:no Physical Aggression / Violence:No  Access to Firearms a concern: No  Gang Involvement:No   Subjective: The patient attended a face-to-face individual therapy session via video today.  The patient gave verbal consent for the video to be on WebEx.  The patient was alone in her home and the therapist was in the office.  The patient presents with a blunted affect and mood is pleasant.  The patient states that she has had a rough week because she has been physically ill.  The patient states that she has been doing okay however she has struggled a little bit with her mother and also gone back to some avoiding behavior.  The patient states that her plumbing went out during the cold snap and she has not done what she needs to to get that resolved.  We talked about needing to go ahead and set goals for herself in terms of getting some of the things done in her house that she needs to do so that when she does have the money to get her plumbing fixed she will be able to do that.  We talked about some strategies to be able to move forward with this project.  Interventions: Cognitive Behavioral Therapy, problem  solving  Diagnosis:PTSD (post-traumatic stress disorder)  Plan: Please see treatment plan in Therapy charts with target date of 02/02/2022.  Patient is scheduled for her next session within the next two weeks. The patient approved this treatment plan.   Chivas Notz G Deloris Moger, LCSW

## 2021-10-13 ENCOUNTER — Ambulatory Visit (INDEPENDENT_AMBULATORY_CARE_PROVIDER_SITE_OTHER): Payer: No Typology Code available for payment source | Admitting: Psychology

## 2021-10-13 DIAGNOSIS — F4310 Post-traumatic stress disorder, unspecified: Secondary | ICD-10-CM

## 2021-10-13 NOTE — Progress Notes (Signed)
[facility] Counselor/Therapist Progress Note  Patient ID: Kristen Brown, MRN: [id number],    Date: 10/13/2021  Time Spent: 60 minutes  Treatment Type: Individual Therapy  Reported Symptoms: sadness, frustration  Mental Status Exam: Appearance:  Casual     Behavior: Appropriate  Motor: Normal  Speech/Language:  Normal Rate  Affect: Depressed  Mood: depressed  Thought process: normal  Thought content:   WNL  Sensory/Perceptual disturbances:   WNL  Orientation: oriented to person, place, time/date, and situation  Attention: Good  Concentration: Good  Memory: WNL  Fund of knowledge:  Good  Insight:   Good  Judgment:  Good  Impulse Control: Good   Risk Assessment: Danger to Self:  No Self-injurious Behavior: No Danger to Others: No Duty to Warn:no Physical Aggression / Violence:No  Access to Firearms a concern: No  Gang Involvement:No   Subjective: The patient attended a face-to-face individual therapy session via video today.  The patient gave verbal consent for the video to be on WebEx.  The patient was alone in her home and the therapist was in the office.  The patient presents with a blunted affect and mood is depressed.  The patient states that she had another difficult week.  She states that she was spoken to by her employer and they told her that she has to get in 40 hours a week or they will change her status.  We discussed some ways to resolve the problem.  I recommended that she ask HR for FMLA paperwork and apply for intermittent FMLA. The patient has MS and may need to be off for appointments and also she struggles with PTSD and may need to be off for appointments for this as well.  We also talked about how to set limits with her son and she talked about her frustrations about this.  The patient seemed to feel much better after problem solving.    Interventions: Cognitive Behavioral Therapy, problem solving  Diagnosis:PTSD (post-traumatic stress  disorder)  Plan: Please see treatment plan in Therapy charts with target date of 02/02/2022.  Patient is scheduled for her next session within the next two weeks. The patient approved this treatment plan.   Tokiko Diefenderfer G Alanta Scobey, LCSW                  Lorcan Shelp G Raudel Bazen, LCSW

## 2021-10-27 ENCOUNTER — Ambulatory Visit (INDEPENDENT_AMBULATORY_CARE_PROVIDER_SITE_OTHER): Payer: No Typology Code available for payment source | Admitting: Psychology

## 2021-10-27 DIAGNOSIS — F4310 Post-traumatic stress disorder, unspecified: Secondary | ICD-10-CM | POA: Diagnosis not present

## 2021-10-27 NOTE — Progress Notes (Signed)
[facility] Counselor/Therapist Progress Note  Patient ID: Kristen Brown, MRN: [id number],    Date: 10/27/2021  Time Spent: 60 minutes  Treatment Type: Individual Therapy  Reported Symptoms: sadness, frustration  Mental Status Exam: Appearance:  Casual     Behavior: Appropriate  Motor: Normal  Speech/Language:  Normal Rate  Affect: Depressed  Mood: depressed  Thought process: normal  Thought content:   WNL  Sensory/Perceptual disturbances:   WNL  Orientation: oriented to person, place, time/date, and situation  Attention: Good  Concentration: Good  Memory: WNL  Fund of knowledge:  Good  Insight:   Good  Judgment:  Good  Impulse Control: Good   Risk Assessment: Danger to Self:  No Self-injurious Behavior: No Danger to Others: No Duty to Warn:no Physical Aggression / Violence:No  Access to Firearms a concern: No  Gang Involvement:No   Subjective: The patient attended a face-to-face individual therapy session via video today.  The patient gave verbal consent for the video to be on WebEx.  The patient was alone in her home and the therapist was in the office.  The patient presents as pleasant and cooperative.  The patient states that she has been doing relatively well.  The patient reports that she did go to HR and get the [employer] [facility] [employer] [facility] paperwork and has not had it completed yet.  The patient is looking for a better opportunity so that she is not on her feet all the time.  We talked about how she is handling relationships and how she is handling the situation at work.  The patient is making good progress and does not do as many of the negative behaviors that she previously did.  She is not self-deprecating and she is not very focused on negative things.  Interventions: Cognitive Behavioral Therapy, problem solving  Diagnosis:PTSD (post-traumatic stress disorder)  Plan: Please see treatment plan in Therapy charts with target date of 02/02/2022.  Patient is scheduled for  her next session within the next two weeks. The patient approved this treatment plan.   Lashane Whelpley G Lissie Hinesley, LCSW                  Mehdi Gironda G Melora Menon, LCSW               Abrina Petz G Kevon Tench, LCSW

## 2021-11-10 ENCOUNTER — Ambulatory Visit (INDEPENDENT_AMBULATORY_CARE_PROVIDER_SITE_OTHER): Payer: No Typology Code available for payment source | Admitting: Psychology

## 2021-11-10 DIAGNOSIS — F4310 Post-traumatic stress disorder, unspecified: Secondary | ICD-10-CM | POA: Diagnosis not present

## 2021-11-10 NOTE — Progress Notes (Signed)
[facility] Counselor/Therapist Progress Note  Patient ID: Kristen Brown, MRN: [id number],    Date: 11/10/2021  Time Spent: 60 minutes  Treatment Type: Individual Therapy  Reported Symptoms: sadness, frustration  Mental Status Exam: Appearance:  Casual     Behavior: Appropriate  Motor: Normal  Speech/Language:  Normal Rate  Affect: Depressed  Mood: depressed  Thought process: normal  Thought content:   WNL  Sensory/Perceptual disturbances:   WNL  Orientation: oriented to person, place, time/date, and situation  Attention: Good  Concentration: Good  Memory: WNL  Fund of knowledge:  Good  Insight:   Good  Judgment:  Good  Impulse Control: Good   Risk Assessment: Danger to Self:  No Self-injurious Behavior: No Danger to Others: No Duty to Warn:no Physical Aggression / Violence:No  Access to Firearms a concern: No  Gang Involvement:No   Subjective: The patient attended a face-to-face individual therapy session via video today.  The patient gave verbal consent for the video to be on WebEx.  The patient was alone in her home and the therapist was in the office.  The patient reports she is a little frustrated today.  The patient states that she has decided that she is going to apply outside of [employer] for work at home jobs.  The patient states that she had an anxiety attack this week.  We talked about why she is feeling like work is a threat and she states that she did have a conversation with her father and felt that this probably not worth staying at the her current job for her pension.  The patient seems to be standing up for herself more which is one of the things we wanted to work on.  I reframed things for her so that she could look at her situation differently.   Interventions: Cognitive Behavioral Therapy, problem solving  Diagnosis:PTSD (post-traumatic stress disorder)  Plan: Please see treatment plan in Therapy charts with target date of  02/02/2022.  Patient is scheduled for her next session within the next two weeks. The patient approved this treatment plan.   Jericho Cieslik G Libra Gatz, LCSW                  Maurita Havener G Brady Schiller, LCSW               Yaritza Leist G Zarion Oliff, LCSW               Chao Blazejewski G Kailan Laws, LCSW

## 2021-11-24 ENCOUNTER — Ambulatory Visit (INDEPENDENT_AMBULATORY_CARE_PROVIDER_SITE_OTHER): Payer: No Typology Code available for payment source | Admitting: Psychology

## 2021-11-24 DIAGNOSIS — F423 Hoarding disorder: Secondary | ICD-10-CM | POA: Diagnosis not present

## 2021-11-24 DIAGNOSIS — F4310 Post-traumatic stress disorder, unspecified: Secondary | ICD-10-CM

## 2021-11-24 NOTE — Progress Notes (Signed)
[facility] Counselor/Therapist Progress Note ? ?Patient ID: Kristen Brown, MRN: [id number],   ? ?Date: 11/24/2021 ? ?Time Spent: 50 minutes ? ?Treatment Type: Individual Therapy ? ?Reported Symptoms: sadness, frustration ? ?Mental Status Exam: ?Appearance:  Casual     ?Behavior: Appropriate  ?Motor: Normal  ?Speech/Language:  Normal Rate  ?Affect: Depressed  ?Mood: depressed  ?Thought process: normal  ?Thought content:   WNL  ?Sensory/Perceptual disturbances:   WNL  ?Orientation: oriented to person, place, time/date, and situation  ?Attention: Good  ?Concentration: Good  ?Memory: WNL  ?Fund of knowledge:  Good  ?Insight:   Good  ?Judgment:  Good  ?Impulse Control: Good  ? ?Risk Assessment: ?Danger to Self:  No ?Self-injurious Behavior: No ?Danger to Others: No ?Duty to Warn:no ?Physical Aggression / Violence:No  ?Access to Firearms a concern: No  ?Gang Involvement:No  ? ?Subjective: The patient attended a face-to-face individual therapy session via video today.  The patient gave verbal consent for the video to be on WebEx.  The patient was alone in her home and the therapist was in the office.  The patient presents as pleasant and cooperative.  The patient reports that she is doing well and has no particular complaints.  The patient states that she is looking for another job that is remote.  She continues to do well at work.  She realizes that she is better than she was when she first started therapy.  She is not as reactive to her parents and handles things better.  The patient continues to clean out her house and still is not exactly where she would like to be however, she continues to try to get things in better shape.  We talked about some of the relationships that she has been involved then and also her relationship with her son.  Utilized cognitive behavioral therapy and problem solving. ? ?Interventions: Cognitive Behavioral Therapy, problem solving ? ?Diagnosis:PTSD (post-traumatic stress  disorder) ? ?Hoarding disorder ? ?Plan: Please see treatment plan in Therapy charts with target date of 02/02/2022.  Patient is scheduled for her next session within the next two weeks. The patient approved this treatment plan.  ? ?Amontae Ng G Arnold Depinto, LCSW ? ? ? ? ? ? ? ? ? ? ? ? ? ? ? ? ? ?Gardiner Espana G Irisa Grimsley, LCSW ? ? ? ? ? ? ? ? ? ? ? ? ? ? ?Renuka Farfan G Beverly Suriano, LCSW ? ? ? ? ? ? ? ? ? ? ? ? ? ? ?Gaylen Pereira G Shauntae Reitman, LCSW ? ? ? ? ? ? ? ? ? ? ? ? ? ? ?Suzy Kugel G Gianni Fuchs, LCSW ?

## 2021-12-08 ENCOUNTER — Ambulatory Visit: Payer: No Typology Code available for payment source | Admitting: Psychology

## 2021-12-15 ENCOUNTER — Ambulatory Visit: Payer: No Typology Code available for payment source | Admitting: Psychology

## 2021-12-22 ENCOUNTER — Ambulatory Visit (INDEPENDENT_AMBULATORY_CARE_PROVIDER_SITE_OTHER): Payer: No Typology Code available for payment source | Admitting: Psychology

## 2021-12-22 DIAGNOSIS — F4310 Post-traumatic stress disorder, unspecified: Secondary | ICD-10-CM

## 2021-12-22 DIAGNOSIS — F423 Hoarding disorder: Secondary | ICD-10-CM | POA: Diagnosis not present

## 2021-12-22 NOTE — Progress Notes (Signed)
[facility] Counselor/Therapist Progress Note ? ?Patient ID: Kristen Brown, MRN: [id number],   ? ?Date: 12/22/2021 ? ?Time Spent: 50 minutes ? ?Treatment Type: Individual Therapy ? ?Reported Symptoms: sadness, frustration ? ?Mental Status Exam: ?Appearance:  Casual     ?Behavior: Appropriate  ?Motor: Normal  ?Speech/Language:  Normal Rate  ?Affect: normal  ?Mood: normal  ?Thought process: normal  ?Thought content:   WNL  ?Sensory/Perceptual disturbances:   WNL  ?Orientation: oriented to person, place, time/date, and situation  ?Attention: Good  ?Concentration: Good  ?Memory: WNL  ?Fund of knowledge:  Good  ?Insight:   Good  ?Judgment:  Good  ?Impulse Control: Good  ? ?Risk Assessment: ?Danger to Self:  No ?Self-injurious Behavior: No ?Danger to Others: No ?Duty to Warn:no ?Physical Aggression / Violence:No  ?Access to Firearms a concern: No  ?Gang Involvement:No  ? ?Subjective: The patient attended a face-to-face individual therapy session via video today.  The patient gave verbal consent for the video to be on WebEx.  The patient was alone in her car alone and the therapist was in the office.  The patient presents as pleasant and cooperative.  The patient reports that she has been doing okay since I saw her last.  The patient states that she is still not happy with her job but she is still looking for something remote.  We talked about her relationships with her mother and father and she seems to be handling those well.  She also is managing her emotions much better than she has in the past related to the trauma she experienced.  The patient reports that she has lost 20 pounds on the new eating program and she feels very pleased about this.  We talked about whether she wants to continue to do therapy every 2 weeks every 4 weeks or graduate.  At this point she would like to stay at 2 weeks and we will discuss at our next session. ?Interventions: Cognitive Behavioral Therapy, problem  solving ? ?Diagnosis:PTSD (post-traumatic stress disorder) ? ?Hoarding disorder ? ?Plan: Please see treatment plan in Therapy charts with target date of 02/02/2022.  Patient is scheduled for her next session within the next two weeks. The patient approved this treatment plan.  ? ?Elkin Belfield G Tationna Fullard, LCSW ? ? ? ? ? ? ? ? ? ? ? ? ? ? ? ? ? ?[name], LCSW ? ? ? ? ? ? ? ? ? ? ? ? ? ? ?[name], LCSW ? ? ? ? ? ? ? ? ? ? ? ? ? ? ?[name], LCSW ? ? ? ? ? ? ? ? ? ? ? ? ? ? ?[name], LCSW ? ? ? ? ? ? ? ? ? ? ? ? ? ? ?[name], LCSW ?

## 2022-01-05 ENCOUNTER — Ambulatory Visit (INDEPENDENT_AMBULATORY_CARE_PROVIDER_SITE_OTHER): Payer: No Typology Code available for payment source | Admitting: Psychology

## 2022-01-05 DIAGNOSIS — F423 Hoarding disorder: Secondary | ICD-10-CM | POA: Diagnosis not present

## 2022-01-05 DIAGNOSIS — F4310 Post-traumatic stress disorder, unspecified: Secondary | ICD-10-CM

## 2022-01-05 NOTE — Progress Notes (Signed)
[facility] Counselor/Therapist Progress Note ? ?Patient ID: Kristen Brown, MRN: [id number],   ? ?Date: 01/05/2022 ? ?Time Spent: 60 minutes ? ?Treatment Type: Individual Therapy ? ?Reported Symptoms: sadness, frustration ? ?Mental Status Exam: ?Appearance:  Casual     ?Behavior: Appropriate  ?Motor: Normal  ?Speech/Language:  Normal Rate  ?Affect: normal  ?Mood: normal  ?Thought process: normal  ?Thought content:   WNL  ?Sensory/Perceptual disturbances:   WNL  ?Orientation: oriented to person, place, time/date, and situation  ?Attention: Good  ?Concentration: Good  ?Memory: WNL  ?Fund of knowledge:  Good  ?Insight:   Good  ?Judgment:  Good  ?Impulse Control: Good  ? ?Risk Assessment: ?Danger to Self:  No ?Self-injurious Behavior: No ?Danger to Others: No ?Duty to Warn:no ?Physical Aggression / Violence:No  ?Access to Firearms a concern: No  ?Gang Involvement:No  ? ?Subjective: The patient attended a face-to-face individual therapy session via video today.  The patient gave verbal consent for the video to be on WebEx.  The patient was alone in her car alone and the therapist was in the office.  The patient presents as pleasant and cooperative.  The patient reports that she feels like she is doing well.  She states that she feels like she is about the same as she was the last time I saw her.  The patient seems to be managing her life well.  She is still working on cleaning out her house but has a potential for a new opportunity to move to [place] with if couple of friends of hers and she is really considering this as an option.  We talked about the pros and cons of her doing this.  The patient seems to be managing her symptoms relatively well at present. ? ?Interventions: Cognitive Behavioral Therapy, problem solving ? ?Diagnosis:PTSD (post-traumatic stress disorder) ? ?Hoarding disorder ? ?Plan: Please see treatment plan in Therapy charts with target date of 02/02/2022.  Patient is scheduled for her  next session within the next two weeks. The patient approved this treatment plan.  ? ?Mayleigh Tetrault G Annella Prowell, LCSW ? ? ? ? ? ? ? ? ? ? ? ? ? ? ? ? ? ?Joseth Weigel G Geri Hepler, LCSW ? ? ? ? ? ? ? ? ? ? ? ? ? ? ?Artemus Romanoff G Gennie Eisinger, LCSW ? ? ? ? ? ? ? ? ? ? ? ? ? ? ?Doha Boling G Shaquna Geigle, LCSW ? ? ? ? ? ? ? ? ? ? ? ? ? ? ?Oliver Heitzenrater G Gio Janoski, LCSW ? ? ? ? ? ? ? ? ? ? ? ? ? ? ?Jennetta Flood G Nery Frappier, LCSW ? ? ? ? ? ? ? ? ? ? ? ? ? ? ?Destin Vinsant G Dacotah Cabello, LCSW ?

## 2022-01-19 ENCOUNTER — Ambulatory Visit: Payer: No Typology Code available for payment source | Admitting: Psychology

## 2022-02-02 ENCOUNTER — Ambulatory Visit (INDEPENDENT_AMBULATORY_CARE_PROVIDER_SITE_OTHER): Payer: No Typology Code available for payment source | Admitting: Psychology

## 2022-02-02 DIAGNOSIS — F423 Hoarding disorder: Secondary | ICD-10-CM | POA: Diagnosis not present

## 2022-02-02 DIAGNOSIS — F4310 Post-traumatic stress disorder, unspecified: Secondary | ICD-10-CM | POA: Diagnosis not present

## 2022-02-02 NOTE — Progress Notes (Signed)
[facility] Counselor/Therapist Progress Note ? ?Patient ID: Kristen Brown, MRN: [id number],   ? ?Date: 02/02/2022 ? ?Time Spent: 60 minutes ? ?Treatment Type: Individual Therapy ? ?Reported Symptoms: sadness, frustration ? ?Mental Status Exam: ?Appearance:  Casual     ?Behavior: Appropriate  ?Motor: Normal  ?Speech/Language:  Normal Rate  ?Affect: normal  ?Mood: normal  ?Thought process: normal  ?Thought content:   WNL  ?Sensory/Perceptual disturbances:   WNL  ?Orientation: oriented to person, place, time/date, and situation  ?Attention: Good  ?Concentration: Good  ?Memory: WNL  ?Fund of knowledge:  Good  ?Insight:   Good  ?Judgment:  Good  ?Impulse Control: Good  ? ?Risk Assessment: ?Danger to Self:  No ?Self-injurious Behavior: No ?Danger to Others: No ?Duty to Warn:no ?Physical Aggression / Violence:No  ?Access to Firearms a concern: No  ?Gang Involvement:No  ? ?Subjective: The patient attended a face-to-face individual therapy session via video today.  The patient gave verbal consent for the video to be on WebEx.  The patient was alone in her car alone and the therapist was in the office.  The patient presents as pleasant and cooperative.  The patient reports that she has been doing okay however she does feel like she has had a little more anxiety because of work.  The patient talked about work being very stressful and she has a new boss and that has been difficult and they have also had a lot of turnover.  We talked about staying the course and keeping it in perspective.  The patient also reports that she has met a new person that she is potentially going to date and feels good about this.  She is still considering moving to [place] at some point with some friends however she has not fully made that decision yet.  We talked about the progress that the patient has made and she admits that she has come a long way since starting therapy.  She reports that she is still like to work on her self  confidence a little bit more and we talked about coming up with ways for her to do that. ? ?Interventions: Cognitive Behavioral Therapy, problem solving ? ?Diagnosis:PTSD (post-traumatic stress disorder) ? ?Hoarding disorder ? ?Plan: Please see treatment plan in Therapy charts with target date of 02/03/2023.  Patient is scheduled for her next session within the next two weeks. The patient approved this treatment plan.  ? ?Matix Henshaw G Reet Scharrer, LCSW ? ? ? ? ? ? ? ? ? ? ? ? ? ? ? ? ? ?Yassine Brunsman G Caila Cirelli, LCSW ? ? ? ? ? ? ? ? ? ? ? ? ? ? ?Apphia Cropley G Gatlin Kittell, LCSW ? ? ? ? ? ? ? ? ? ? ? ? ? ? ?Haydon Dorris G Renleigh Ouellet, LCSW ? ? ? ? ? ? ? ? ? ? ? ? ? ? ?Aryanah Enslow G Roylee Chaffin, LCSW ? ? ? ? ? ? ? ? ? ? ? ? ? ? ?Teila Skalsky G Ita Fritzsche, LCSW ? ? ? ? ? ? ? ? ? ? ? ? ? ? ?Alethia Melendrez G Tierra Divelbiss, LCSW ? ? ? ? ? ? ? ? ? ? ? ? ? ? ?Adaliz Dobis G Doshia Dalia, LCSW ?

## 2022-02-03 ENCOUNTER — Other Ambulatory Visit: Payer: Self-pay | Admitting: Family Medicine

## 2022-02-03 DIAGNOSIS — Z1231 Encounter for screening mammogram for malignant neoplasm of breast: Secondary | ICD-10-CM

## 2022-02-08 DIAGNOSIS — R269 Unspecified abnormalities of gait and mobility: Secondary | ICD-10-CM

## 2022-02-08 DIAGNOSIS — G35 Multiple sclerosis: Secondary | ICD-10-CM

## 2022-02-09 ENCOUNTER — Ambulatory Visit
Admission: RE | Admit: 2022-02-09 | Discharge: 2022-02-09 | Disposition: A | Discharge status: Home / Self Care | Payer: No Typology Code available for payment source | Source: Ambulatory Visit

## 2022-02-09 ENCOUNTER — Ambulatory Visit
Admission: RE | Admit: 2022-02-09 | Discharge: 2022-02-09 | Disposition: A | Discharge status: Home / Self Care | Payer: No Typology Code available for payment source | Source: Ambulatory Visit | Attending: Neurology | Admitting: Neurology

## 2022-02-09 DIAGNOSIS — G35 Multiple sclerosis: Secondary | ICD-10-CM

## 2022-02-09 DIAGNOSIS — Z1231 Encounter for screening mammogram for malignant neoplasm of breast: Secondary | ICD-10-CM

## 2022-02-09 DIAGNOSIS — R269 Unspecified abnormalities of gait and mobility: Secondary | ICD-10-CM

## 2022-02-09 MED ORDER — GADOBENATE DIMEGLUMINE 529 MG/ML IV SOLN
20.0000 mL | Freq: Once | INTRAVENOUS | Status: AC | PRN
  Administered 2022-02-09: 20 mL via INTRAVENOUS

## 2022-02-16 ENCOUNTER — Ambulatory Visit: Payer: No Typology Code available for payment source | Admitting: Psychology

## 2022-03-02 ENCOUNTER — Ambulatory Visit (INDEPENDENT_AMBULATORY_CARE_PROVIDER_SITE_OTHER): Payer: No Typology Code available for payment source | Admitting: Psychology

## 2022-03-02 DIAGNOSIS — F423 Hoarding disorder: Secondary | ICD-10-CM

## 2022-03-02 DIAGNOSIS — F4310 Post-traumatic stress disorder, unspecified: Secondary | ICD-10-CM

## 2022-03-03 NOTE — Progress Notes (Signed)
[facility] Counselor/Therapist Progress Note  Patient ID: MADALAINE PORTIER, MRN: [id number],    Date: 03/02/2022  Time Spent: 60 minutes  Treatment Type: Individual Therapy  Reported Symptoms: sadness, frustration  Mental Status Exam: Appearance:  Casual     Behavior: Appropriate  Motor: Normal  Speech/Language:  Normal Rate  Affect: anxious  Mood: anxious  Thought process: normal  Thought content:   WNL  Sensory/Perceptual disturbances:   WNL  Orientation: oriented to person, place, time/date, and situation  Attention: Good  Concentration: Good  Memory: WNL  Fund of knowledge:  Good  Insight:   Good  Judgment:  Good  Impulse Control: Good   Risk Assessment: Danger to Self:  No Self-injurious Behavior: No Danger to Others: No Duty to Warn:no Physical Aggression / Violence:No  Access to Firearms a concern: No  Gang Involvement:No   Subjective: The patient attended a face-to-face individual therapy session via video today.  The patient gave verbal consent for the video to be on WebEx.  The patient was alone in her car alone and the therapist was in the office.  The patient presents as anxious today.  The patient reports that she has been more anxious lately because she has been struggling more at work with her MS.  We discussed several different options for her to be able to manage her work situation.  I recommended that she contact HR and get FMLA paperwork first and apply for intermittent FMLA.  We also talked about her checking into short-term and long-term disability and seeing what that entails through her work.  We also discussed her talking to them about the possibility of doing a different job or having some sort of accommodation for her MS.  I recommended that she first start with HR and see if they can come up with some sort of plan to figure out an accommodation for her.  The patient felt that the session was helpful and we reconvene in 2  weeks.  Interventions: Cognitive Behavioral Therapy, problem solving  Diagnosis:PTSD (post-traumatic stress disorder)  Hoarding disorder  Plan: Client Abilities/Strengths  Intelligent, ability for insight, motivated  Client Treatment Preferences  Outpatient individual therapy  Client Statement of Needs  "[name] feels that I need more help with my trauma than she can offer"  Treatment Level  Outpatient Individual therapy  Symptoms  Displays a significant decline in interest and engagement in activities.:  (Status:  improved). Displays significant psychological and/or physiological distress resulting from internal and  external clues that are reminiscent of the traumatic event.:(Status: maintained). Experiences disturbances in sleep.: (Status: improved). Experiences disturbing  and persistent thoughts, images, and/or perceptions of the traumatic event.:  (Status: improved). Experiences frequent nightmares.:  (Status: improved). Has  been exposed to a traumatic event involving actual or perceived threat of death or serious injury.: (Status: maintained). Impairment in social, occupational, or other areas of  functioning.:  (Status: improved). Intentionally avoids thoughts, feelings, or  discussions related to the traumatic event.: (Status: improved). Reports  response of intense fear, helplessness, or horror to the traumatic event.: (Status: improved). Symptoms present more than one month.:(Status: maintained).  Goals 1. Eliminate or reduce the negative impact trauma related symptoms have  on social, occupational, and family functioning. 2. No longer experiences intrusive event recollections, avoidance of event  reminders, intense arousal, or disinterest in activities or  relationships. Objective Verbalize any symptoms of depression, including any suicidal thoughts. Target Date: 2023-02-03 Frequency: Biweekly Progress: 70 Modality: individual Objective Participate in [place]  Movement  Desensitization and Reprocessing (EMDR) to reduce emotional distress  related to traumatic thoughts, feelings, and images. Target Date: 2023-02-03 Frequency: Biweekly Progress: 60 Modality: individual Related Interventions 1. Utilize Eye Movement Desensitization and Reprocessing (EMDR) to reduce the client's  emotional reactivity to the traumatic event and reduce PTSD symptoms. Objective Learn and implement calming skills. Target Date: 2023-02-03 Frequency: Biweekly Progress: 40 Modality: individual Related Interventions 1. Teach the client calming skills (e.g., breathing retraining, relaxation, calming self-talk) to use in and between sessions when feeling overly distressed. Objective Participate in Cognitive Therapy to help identify, challenge, and replace biased, negative, and selfdefeating thoughts resulting from the trauma. Target Date: 2023-02-03 Frequency: Biweekly Progress: 50 Modality: individual Related Interventions 1. Using Cognitive Therapy techniques, explore the client's self-talk and beliefs about self, others,  and the future that are a consequence of the trauma (e.g., themes of safety, trust, power, control,  esteem, and intimacy); identify and challenge biases; assist him/her in generating appraisals that correct for the biases; test biased and alternatives predictions through behavioral experiments. Objective Learn and implement guided self-dialogue to manage thoughts, feelings, and urges brought on by  encounters with trauma-related situations. Target Date: 2023-02-03 Frequency: Biweekly Progress: 60 Modality: individual Related Interventions 1. Teach the client a guided self-dialogue procedure in which he/she learns to recognize  maladaptive self-talk, challenges its biases, copes with engendered feelings, overcomes  avoidance, and reinforces his/her accomplishments; review and reinforce progress, problemsolve obstacles. Objective Learn and implement approaches  for addressing shame and self-disparagement. Target Date: 2023-02-03 Frequency: Biweekly Progress: 70 Modality: individual Diagnosis F43.10 (Posttraumatic stress disorder) - Open - [Signifier: n/a] Posttraumatic Stress  Disorder  Conditions For Discharge Achievement of treatment goals and objectives Patient is scheduled for her next session within the next two weeks. The patient approved this treatment plan.   Montario Zilka G Laniyah Rosenwald, LCSW                  Roneka Gilpin G Laneisha Mino, LCSW               Gildo Crisco G Earlena Werst, LCSW               Shruthi Northrup G Geovanna Simko, LCSW               Dorlisa Savino G Khiyan Crace, LCSW               Bernell Sigal G Abad Manard, LCSW               Random Dobrowski G Cherilynn Schomburg, LCSW               Liesl Simons G Darina Hartwell, LCSW               Adryan Druckenmiller G Carney Saxton, LCSW

## 2022-03-07 ENCOUNTER — Ambulatory Visit (INDEPENDENT_AMBULATORY_CARE_PROVIDER_SITE_OTHER): Payer: No Typology Code available for payment source | Admitting: Neurology

## 2022-03-07 ENCOUNTER — Encounter: Payer: Self-pay | Admitting: Neurology

## 2022-03-07 VITALS — BP 157/95 | HR 78 | Ht 70.0 in | Wt 362.0 lb

## 2022-03-07 DIAGNOSIS — G4733 Obstructive sleep apnea (adult) (pediatric): Secondary | ICD-10-CM

## 2022-03-07 DIAGNOSIS — R5383 Other fatigue: Secondary | ICD-10-CM | POA: Diagnosis not present

## 2022-03-07 DIAGNOSIS — R269 Unspecified abnormalities of gait and mobility: Secondary | ICD-10-CM

## 2022-03-07 DIAGNOSIS — G35 Multiple sclerosis: Secondary | ICD-10-CM | POA: Diagnosis not present

## 2022-03-07 DIAGNOSIS — G4719 Other hypersomnia: Secondary | ICD-10-CM

## 2022-03-07 MED ORDER — ARMODAFINIL 250 MG PO TABS
250.0000 mg | ORAL_TABLET | Freq: Every day | ORAL | 5 refills | Status: DC
Start: 2022-03-07 — End: 2023-06-28

## 2022-03-07 NOTE — Progress Notes (Signed)
[facility]  PATIENT: Kristen Brown DOB: 09-Nov-1975  REFERRING DOCTOR OR PCP:  Sigmund Hazel, MD SOURCE: patient, notes from Dr. Hyacinth Meeker and Dr. Gaynelle Adu, imaging and lab reports, MRI images on PACS  _________________________________   HISTORICAL  CHIEF COMPLAINT:  Chief Complaint  Patient presents with   Follow-up    Pt alone. Rm 2. MS follow up. Last seen 08/2021- DMT Ocrevus. Last infusion date: 12/29/2021/Next infusion date: 06/29/2022. Overall energy levels are decreasing.not sleeping well. Not able to do her job as efficiently as she was before. Has started to have incontinence with bowel and bladder. She has had a SS in the past prior to 2019. Did not indicate but possibly mild apnea. She states that she wakes up with legs aching and also 3 am she will wake up no reason and has difficulty w/ going back to sleep   Update 03/07/2022: She reports more problems with fatigue that is affecting work.   She has to leave early some days.   She wakes up several times a night feeling uncomfortable and has trouble falling back asleep.   She sometimes leaves work to take a nap.      She snores but had a home sleep study and was told she had mild OSA.      She had been on trazodone in past that helped her fall asleep better than stay asleep.   Se takes gabapentin but often skips her bedtime 300 mg dose.     SHe has ESS of 20/24.   Does not have sleep paralysis or cataplexy.  Dreams can be vivid.     She feels anxiety is doing worse.         She notes more leg pais, especially in the afternoons.     She switched from DMF to Ocrevus in 2022 and is tolerating it well.      Her next dose is June 29 2022.     She denies exacerbations or new symptoms.   MRI last month showed no new MS lesions.  In 2022,  she had new onset leg numbness and she is back to pre-June baseline with sone right leg walking.   She uses a cane but can walk ok most times without it.   MRI of the brain and  cervical spine showed multiple enhancing foci plus additional T2 lesions not present on her previous MRI.  Therefore, she will switch from dimethyl fumarate to Ocrevus.  She has mildly reduced gait due to right foot drop.  Stairs are difficult.   She has some hand and leg numbness (right) at times  ut it is mild.     She has urinary urgency with rare incontinence.   Has also had rare bowel incontinence.   Vision is ok - mild blurry but better than last year.    She has fatigue that is worsening.    She has some verbal fluency issues but nothing newg.    She sometimes notes reduced focus and attnetion. She has some anxiety.   She sees a Veterinary surgeon.    Migraines are doing well.  EPWORTH SLEEPINESS SCALE  On a scale of 0 - 3 what is the chance of dozing:  Sitting and Reading:   2 Watching TV:    3 Sitting inactive in a public place: 3 Passenger in car for one hour: 3 Lying down to rest in the afternoon: 3 Sitting and talking to someone: 1 Sitting quietly after lunch:  3  In a car, stopped in traffic:  2  Total (out of 24):    20/24    severe daytime sleepiness      MS HISTORY: She was diagnosed with MS in November 2014.    In early 2014,, she had numbness in her legs that was ust present one morning.  It improved over a few weeks and she did not see any doctors.    Later that year, she had diplopia that was preent one morning.   Symptoms took 6 months to mostly recover.   She feels worse if very tired or hot.   She had an MRi of the brain showing multiple lesions c/with MS.   3 lesions enhanced.    She was referred to Dr Gaynelle Adu at [facility].   She started Tecfidera.    She needed to titrate slow;y due to GI upset when she tried a higher dose more rapidly.    She no longer has GI issues but has occasional flushing still that responds to aspirin.     Since starting the medication she did not had definite exacerbation until possibly earlier this year.   She had an exacerbation 01/2021 and MRI showed  multipke enhancing lesions.   She was placed on Ocrevus.      MRI of the brain 05/11/2018 showed 1 or 2 more foci that were not present compared to the 2014 MRI including one in the middle cerebellar peduncle.  MRI of the cervical spine shows 1 probable MS focus.  MRI [facility] 03/06/21 showed multiple T2/FLAIR hyperintense foci in the cerebral hemispheres, thalamus, brainstem and left middle cerebellar peduncle in a pattern consistent with demyelinating plaque associated with multiple sclerosis.  5 foci in the cerebral hemispheres enhance after gadolinium contrast consistent with acute demyelinating plaque.  Several nonenhancing foci in the hemispheres, 2 foci in the thalamus and 1 in the midbrain are present on the current MRI but were not present on the 2019 MRI.  MRI cervical spine 03/06/2021 showed multiple T2 hyperintense foci within the spinal cord in a pattern consistent with demyelination associated with multiple sclerosis.  All but one of the foci are new compared to the 2019 MRI..  3 foci at C4-C5 and C7 enhance after contrast.  This is consistent with acute demyelinating plaques.  MRI of the brain 02/09/2022 showed T2/FLAIR hyperintense foci in the cerebral hemispheres, thalamus, brainstem and left middle cerebellar peduncle in a pattern consistent with chronic demyelinating plaque associated with multiple sclerosis.  None of the foci appear to be acute.  They do not enhance.  Compared to the MRI from 03/06/2021, there were no new lesions.   REVIEW OF SYSTEMS: Constitutional: No fevers, chills, sweats, or change in appetite.   She has fatigue. Eyes: No visual changes, double vision, eye pain Ear, nose and throat: No hearing loss, ear pain, nasal congestion, sore throat Cardiovascular: No chest pain, palpitations Respiratory:  No shortness of breath at rest or with exertion.   Mild OSA GastrointestinaI: No nausea, vomiting, diarrhea, abdominal pain, fecal incontinence Genitourinary:  No dysuria,  urinary retention or frequency.  No nocturia. Musculoskeletal:  No neck pain, back pain Integumentary: No rash, pruritus, skin lesions Neurological: as above Psychiatric: No depression at this time.  No anxiety Endocrine: No palpitations, diaphoresis, change in appetite, change in weigh or increased thirst Hematologic/Lymphatic:  No anemia, purpura, petechiae. Allergic/Immunologic: No itchy/runny eyes, nasal congestion, recent allergic reactions, rashes  ALLERGIES: Allergies  Allergen Reactions   Sulfa Antibiotics Hives  Adhesive [Tape] Rash   Latex Rash    HOME MEDICATIONS:  Current Outpatient Medications:    ARIPiprazole (ABILIFY) 5 MG tablet, Take 5 mg by mouth daily., Disp: , Rfl:    Armodafinil 250 MG tablet, Take 1 tablet (250 mg total) by mouth daily., Disp: 30 tablet, Rfl: 5   Cholecalciferol (VITAMIN D3) 125 MCG (5000 UT) TABS, Take 1 tablet by mouth daily., Disp: , Rfl:    diclofenac (VOLTAREN) 75 MG EC tablet, Take 1 tablet (75 mg total) by mouth 2 (two) times daily., Disp: 30 tablet, Rfl: 2   DULoxetine (CYMBALTA) 60 MG capsule, Take 60 mg by mouth daily., Disp: , Rfl:    escitalopram (LEXAPRO) 20 MG tablet, TAKE 1 TABLET BY MOUTH EVERY DAY, Disp: 90 tablet, Rfl: 0   gabapentin (NEURONTIN) 300 MG capsule, Take 1 capsule (300 mg total) by mouth 3 (three) times daily., Disp: 90 capsule, Rfl: 11   ibuprofen (ADVIL,MOTRIN) 800 MG tablet, Take 1 tablet (800 mg total) by mouth 3 (three) times daily., Disp: 21 tablet, Rfl: 0   loratadine (CLARITIN) 10 MG tablet, Take 10 mg by mouth daily., Disp: , Rfl:    LORazepam (ATIVAN) 0.5 MG tablet, Take 0.5 mg by mouth every 8 (eight) hours., Disp: , Rfl:    magnesium gluconate (MAGONATE) 500 MG tablet, Take 250 mg by mouth daily., Disp: , Rfl:    norgestimate-ethinyl estradiol (ORTHO-CYCLEN) 0.25-35 MG-MCG tablet, Take 1 tablet by mouth daily., Disp: , Rfl:    ocrelizumab (OCREVUS) 300 MG/10ML injection, Inject 600 mg into the vein every  6 (six) months., Disp: , Rfl:    SUMAtriptan (IMITREX) 50 MG tablet, TAKE 1 TAB(S) ORALLY ONCE, CAN REPEAT 2 HOURS LATER IF NEEDED, Disp: , Rfl: 0  PAST MEDICAL HISTORY: Past Medical History:  Diagnosis Date   Anxiety    Depression    Gallstones 07/2014   GERD (gastroesophageal reflux disease)    no current med.   Irritable bowel syndrome (IBS)    Migraines    Multiple sclerosis (HCC)    Sinus infection 08/11/2014   will be finished with antibiotic prior to surgery   Vision abnormalities     PAST SURGICAL HISTORY: Past Surgical History:  Procedure Laterality Date   CHOLECYSTECTOMY N/A 08/19/2014   Procedure: LAPAROSCOPIC CHOLECYSTECTOMY WITH INTRAOPERATIVE CHOLANGIOGRAM;  Surgeon: Harriette Bouillon, MD;  Location: [facility];  Service: General;  Laterality: N/A;   NO PAST SURGERIES      FAMILY HISTORY: Family History  Problem Relation Age of Onset   Hyperlipidemia Mother    Hyperlipidemia Father    Diabetes Father    Atrial fibrillation Father    Cancer Paternal Grandfather        colon   Healthy Brother    Lung cancer Maternal Grandmother    Diabetes Paternal Grandmother     SOCIAL HISTORY:  Social History   Socioeconomic History   Marital status: Single    Spouse name: Not on file   Number of children: Not on file   Years of education: Not on file   Highest education level: Not on file  Occupational History   Not on file  Tobacco Use   Smoking status: Never   Smokeless tobacco: Never  Substance and Sexual Activity   Alcohol use: No   Drug use: No   Sexual activity: Not on file  Other Topics Concern   Not on file  Social History Narrative   Not on file   Social  Determinants of Health   Financial Resource Strain: Not on file  Food Insecurity: Not on file  Transportation Needs: Not on file  Physical Activity: Not on file  Stress: Not on file  Social Connections: Not on file  Intimate Partner Violence: Not on file     PHYSICAL  EXAM  Vitals:   03/07/22 0942  BP: (!) 157/95  Pulse: 78  Weight: (!) 362 lb (164.2 kg)  Height: 5\' 10"  (1.778 m)    Body mass index is 51.94 kg/m.   General: The patient is well-developed and well-nourished and in no acute distress  Neck: The neck is supple, no carotid bruits are noted.  The neck is nontender.  Cardiovascular: The heart has a regular rate and rhythm with a normal S1 and S2. There were no murmurs, gallops or rubs.   Skin: Extremities are without significant edema.  Musculoskeletal:  Back is nontender  Neurologic Exam  Mental status: The patient is alert and oriented x 3 at the time of the examination. The patient has apparent normal recent and remote memory, with an apparently normal attention span and concentration ability.   Speech is normal.  Cranial nerves: Extraocular movements are full.  Facial strength and sensation was normal.  No dysarthria.  Hearing seems symmetric.  Motor:  Muscle bulk is normal.   Tone is normal. Strength is  5 / 5 in all 4 extremities.   Sensory: Sensory testing shows reduced vibration and temperature sensation in hte right arm and leg.   Touch was more sytmmetric.    Coordination: Cerebellar testing reveals good finger-nose-finger and reduced right heel-to-shin   Gait and station: Station is normal.   The gait was fairly normal.  Tandem gait was wide.. Romberg is negative.   Reflexes: Deep tendon reflexes are symmetric and normal bilaterally.   Plantar responses are flexor.    DIAGNOSTIC DATA (LABS, IMAGING, TESTING) - I reviewed patient records, labs, notes, testing and imaging myself where available.  Lab Results  Component Value Date   WBC 11.2 (H) 03/09/2021   HGB 13.6 03/09/2021   HCT 42.3 03/09/2021   MCV 83 03/09/2021   PLT 181 03/09/2021      Component Value Date/Time   NA 139 03/09/2021 1552   K 3.7 03/09/2021 1552   CL 98 03/09/2021 1552   CO2 21 03/09/2021 1552   GLUCOSE 106 (H) 03/09/2021 1552    GLUCOSE 76 08/12/2014 1220   BUN 10 03/09/2021 1552   CREATININE 0.64 03/09/2021 1552   CALCIUM 9.7 03/09/2021 1552   PROT 6.9 03/09/2021 1552   ALBUMIN 4.4 03/09/2021 1552   AST 12 03/09/2021 1552   ALT 14 03/09/2021 1552   ALKPHOS 85 03/09/2021 1552   BILITOT 0.3 03/09/2021 1552   GFRNONAA >90 08/12/2014 1220   GFRAA >90 08/12/2014 1220      ASSESSMENT AND PLAN  Multiple sclerosis (HCC)  OSA (obstructive sleep apnea) - Plan: Home sleep test  Gait disturbance  Other fatigue  Excessive daytime sleepiness - Plan: Home sleep test  1.   She will continue Ocrevus.  She is tolerating it well and has not had any exacerbations since starting.  At the time of her next visit we will check CBC with differential and IgG 2.  stay active and exercise as tolerated.  3.  For her fatigue I will start armodafinil.  We also need to check a home sleep study to determine if her obstructive sleep apnea has worsened.  She reportedly had  mild OSA in 2018 but has much more sleepiness currently.  4. Return in 6 months or sooner if there are new or worsening neurologic symptoms.  Giordan Fordham A. Epimenio Foot, MD, [facility] 03/07/2022, 3:15 PM Certified in Neurology, Clinical Neurophysiology, Sleep Medicine, Pain Medicine and Neuroimaging  [facility] [address] [place], [place] [address] [phone]

## 2022-03-08 ENCOUNTER — Telehealth: Payer: Self-pay | Admitting: *Deleted

## 2022-03-08 NOTE — Telephone Encounter (Signed)
Submitted PA armodafinil on CMM. Key: [id number]. Waiting on determination from [employer].

## 2022-03-10 DIAGNOSIS — Z0289 Encounter for other administrative examinations: Secondary | ICD-10-CM

## 2022-03-10 NOTE — Telephone Encounter (Signed)
Received fax that PA armodafinil denied. Pt must have been on treatment for OSA (CPAP) for at least one month before they will cover medication.

## 2022-03-16 ENCOUNTER — Ambulatory Visit (INDEPENDENT_AMBULATORY_CARE_PROVIDER_SITE_OTHER): Payer: No Typology Code available for payment source | Admitting: Psychology

## 2022-03-16 ENCOUNTER — Telehealth: Payer: Self-pay

## 2022-03-16 DIAGNOSIS — F4310 Post-traumatic stress disorder, unspecified: Secondary | ICD-10-CM

## 2022-03-16 DIAGNOSIS — F423 Hoarding disorder: Secondary | ICD-10-CM

## 2022-03-16 NOTE — Telephone Encounter (Signed)
Received FMLA pw on behalf of the pt. I reviewed chart and paper copies and could not locate where we have completed this before. I will send a message to the pt asking what this form is requesting.

## 2022-03-16 NOTE — Progress Notes (Signed)
Kristen Brown  Patient ID: Kristen Brown, MRN: [id number],    Date: 03/16/2022  Time Spent: 60 minutes  Treatment Type: Individual Therapy  Reported Symptoms: sadness, frustration  Mental Status Exam: Appearance:  Casual     Behavior: Appropriate  Motor: Normal  Speech/Language:  Normal Rate  Affect: anxious  Mood: anxious  Thought process: normal  Thought content:   WNL  Sensory/Perceptual disturbances:   WNL  Orientation: oriented to person, place, time/date, and situation  Attention: Good  Concentration: Good  Memory: WNL  Fund of knowledge:  Good  Insight:   Good  Judgment:  Good  Impulse Control: Good   Risk Assessment: Danger to Self:  No Self-injurious Behavior: No Danger to Others: No Duty to Warn:no Physical Aggression / Violence:No  Access to Firearms a concern: No  Gang Involvement:No   Subjective: The patient attended a face-to-face individual therapy session via video today.  The patient gave verbal consent for the video to be on WebEx.  The patient was alone in her car alone and the therapist was in the office.  The patient presents as a little less anxious than she was previously.  The patient reports that the doctor is filling out her FMLA paperwork so that she can have an accommodation at work.  She seems more relaxed about the situation.  The patient talked about being involved again with a married man.  We talked about the concerns with this and I recommended that she be cautious moving forward.  The patient also talked about wanting to continue to clean up her house and we discussed and problem solved around that.  The patient has made good progress in therapy and does not seem to be quite as reactive to negative criticism as she used to be.  We will continue to work with patient on decreasing hoarding behavior and making good choices.  Interventions: Cognitive Behavioral Therapy, problem  solving  Diagnosis:PTSD (post-traumatic stress disorder)  Hoarding disorder  Plan: Client Abilities/Strengths  Intelligent, ability for insight, motivated  Client Treatment Preferences  Outpatient individual therapy  Client Statement of Needs  "Kristen Brown feels that I need more help with my trauma than she can offer"  Treatment Level  Outpatient Individual therapy  Symptoms  Displays a significant decline in interest and engagement in activities.:  (Status:  improved). Displays significant psychological and/or physiological distress resulting from internal and  external clues that are reminiscent of the traumatic event.:(Status: maintained). Experiences disturbances in sleep.: (Status: improved). Experiences disturbing  and persistent thoughts, images, and/or perceptions of the traumatic event.:  (Status: improved). Experiences frequent nightmares.:  (Status: improved). Has  been exposed to a traumatic event involving actual or perceived threat of death or serious injury.: (Status: maintained). Impairment in social, occupational, or other areas of  functioning.:  (Status: improved). Intentionally avoids thoughts, feelings, or  discussions related to the traumatic event.: (Status: improved). Reports  response of intense fear, helplessness, or horror to the traumatic event.: (Status: improved). Symptoms present more than one month.:(Status: maintained).  Goals 1. Eliminate or reduce the negative impact trauma related symptoms have  on social, occupational, and family functioning. 2. No longer experiences intrusive event recollections, avoidance of event  reminders, intense arousal, or disinterest in activities or  relationships. Objective Verbalize any symptoms of depression, including any suicidal thoughts. Target Date: 2023-02-03 Frequency: Biweekly Progress: 70 Modality: individual Objective Participate in Eye Movement Desensitization and Reprocessing (EMDR) to reduce emotional  distress  related to traumatic  thoughts, feelings, and images. Target Date: 2023-02-03 Frequency: Biweekly Progress: 60 Modality: individual Related Interventions 1. Utilize Eye Movement Desensitization and Reprocessing (EMDR) to reduce the client's  emotional reactivity to the traumatic event and reduce PTSD symptoms. Objective Learn and implement calming skills. Target Date: 2023-02-03 Frequency: Biweekly Progress: 40 Modality: individual Related Interventions 1. Teach the client calming skills (e.g., breathing retraining, relaxation, calming self-talk) to use in and between sessions when feeling overly distressed. Objective Participate in Cognitive Therapy to help identify, challenge, and replace biased, negative, and selfdefeating thoughts resulting from the trauma. Target Date: 2023-02-03 Frequency: Biweekly Progress: 50 Modality: individual Related Interventions 1. Using Cognitive Therapy techniques, explore the client's self-talk and beliefs about self, others,  and the future that are a consequence of the trauma (e.g., themes of safety, trust, power, control,  esteem, and intimacy); identify and challenge biases; assist him/her in generating appraisals that correct for the biases; test biased and alternatives predictions through behavioral experiments. Objective Learn and implement guided self-dialogue to manage thoughts, feelings, and urges brought on by  encounters with trauma-related situations. Target Date: 2023-02-03 Frequency: Biweekly Progress: 60 Modality: individual Related Interventions 1. Teach the client a guided self-dialogue procedure in which he/she learns to recognize  maladaptive self-talk, challenges its biases, copes with engendered feelings, overcomes  avoidance, and reinforces his/her accomplishments; review and reinforce progress, problemsolve obstacles. Objective Learn and implement approaches for addressing shame and self-disparagement. Target Date:  2023-02-03 Frequency: Biweekly Progress: 70 Modality: individual Diagnosis F43.10 (Posttraumatic stress disorder) - Open - [Signifier: n/a] Posttraumatic Stress  Disorder  Conditions For Discharge Achievement of treatment goals and objectives Patient is scheduled for her next session within the next two weeks. The patient approved this treatment plan.   Wilbert Schouten G Darryn Kydd, LCSW                  Jakarius Flamenco G Drequan Ironside, LCSW               Kierrah Kilbride G Catcher Dehoyos, LCSW               Rivaldo Hineman G Erminie Foulks, LCSW               Ledonna Dormer G Jaymason Ledesma, LCSW               Kamal Jurgens G Monte Zinni, LCSW               Annaliyah Willig G Anastacia Reinecke, LCSW               Mandeep Kiser G Daniele Dillow, LCSW               Lakethia Coppess G Lilyann Gravelle, LCSW               Naydeline Morace G Jumanah Hynson, LCSW

## 2022-03-23 NOTE — Telephone Encounter (Signed)
Gave completed/signed form back to medical records to process for pt. 

## 2022-03-23 NOTE — Telephone Encounter (Signed)
Called pt. States she is requesting FMLA/intermittent leave for MS. Also wanting to work 6hr days instead of 8hr. States she discussed this w/ Dr. Epimenio Foot at last visit (03/07/2022).  Advised we will work on forms and call back if we have any further questions.

## 2022-03-30 ENCOUNTER — Telehealth: Payer: Self-pay | Admitting: Neurology

## 2022-03-30 ENCOUNTER — Ambulatory Visit (INDEPENDENT_AMBULATORY_CARE_PROVIDER_SITE_OTHER): Payer: No Typology Code available for payment source | Admitting: Psychology

## 2022-03-30 DIAGNOSIS — F423 Hoarding disorder: Secondary | ICD-10-CM

## 2022-03-30 DIAGNOSIS — F4310 Post-traumatic stress disorder, unspecified: Secondary | ICD-10-CM | POA: Diagnosis not present

## 2022-03-30 NOTE — Telephone Encounter (Signed)
MAIL OUT- [employer] no Berkley Harvey req spoke to [place] ref # [id number].Marland Kitchen  Patient is aware that we will mail out a device to her, she is aware that once she receives it to download an app to her phone and follow the instructions and do to the study shortly after she receives the device. She is also aware to not throw away anything until after she gets a call from [place] to let her know that we have received enough data for her.

## 2022-03-31 NOTE — Progress Notes (Signed)
[facility] Counselor/Therapist Progress Note  Patient ID: Kristen Brown, MRN: [id number],    Date: 03/30/2022  Time Spent: 60 minutes  Treatment Type: Individual Therapy  Reported Symptoms: sadness, frustration  Mental Status Exam: Appearance:  Casual     Behavior: Appropriate  Motor: Normal  Speech/Language:  Normal Rate  Affect: anxious  Mood: anxious  Thought process: normal  Thought content:   WNL  Sensory/Perceptual disturbances:   WNL  Orientation: oriented to person, place, time/date, and situation  Attention: Good  Concentration: Good  Memory: WNL  Fund of knowledge:  Good  Insight:   Good  Judgment:  Good  Impulse Control: Good   Risk Assessment: Danger to Self:  No Self-injurious Behavior: No Danger to Others: No Duty to Warn:no Physical Aggression / Violence:No  Access to Firearms a concern: No  Gang Involvement:No   Subjective: The patient attended a face-to-face individual therapy session via video today.  The patient gave verbal consent for the video to be on WebEx.  The patient was alone in her car alone and the therapist was in the office.  The patient presents as anxious today.  The patient reports that she has felt more anxious in the last couple of days.  She was able to relate that her anxiety is probably related to her visit with her father on the weekend.  Her father was critical of her in front of a friend.  We talked about the need to have a conversation with him and let him know that that was bothersome.  Also she has not been doing much around the house to get her hoarding under control and she has not felt pressure because he was saying that she needed to do something different.  Encouraged her to work on things one step at a time. Interventions: Cognitive Behavioral Therapy, problem solving  Diagnosis:PTSD (post-traumatic stress disorder)  Hoarding disorder  Plan: Client Abilities/Strengths  Intelligent, ability for  insight, motivated  Client Treatment Preferences  Outpatient individual therapy  Client Statement of Needs  "Kristen Brown feels that I need more help with my trauma than she can offer"  Treatment Level  Outpatient Individual therapy  Symptoms  Displays a significant decline in interest and engagement in activities.:  (Status:  improved). Displays significant psychological and/or physiological distress resulting from internal and  external clues that are reminiscent of the traumatic event.:(Status: maintained). Experiences disturbances in sleep.: (Status: improved). Experiences disturbing  and persistent thoughts, images, and/or perceptions of the traumatic event.:  (Status: improved). Experiences frequent nightmares.:  (Status: improved). Has  been exposed to a traumatic event involving actual or perceived threat of death or serious injury.: (Status: maintained). Impairment in social, occupational, or other areas of  functioning.:  (Status: improved). Intentionally avoids thoughts, feelings, or  discussions related to the traumatic event.: (Status: improved). Reports  response of intense fear, helplessness, or horror to the traumatic event.: (Status: improved). Symptoms present more than one month.:(Status: maintained).  Goals 1. Eliminate or reduce the negative impact trauma related symptoms have  on social, occupational, and family functioning. 2. No longer experiences intrusive event recollections, avoidance of event  reminders, intense arousal, or disinterest in activities or  relationships. Objective Verbalize any symptoms of depression, including any suicidal thoughts. Target Date: 2023-02-03 Frequency: Biweekly Progress: 70 Modality: individual Objective Participate in Eye Movement Desensitization and Reprocessing (EMDR) to reduce emotional distress  related to traumatic thoughts, feelings, and images. Target Date: 2023-02-03 Frequency: Biweekly Progress: 60 Modality:  individual Related Interventions  1. Utilize Eye Movement Desensitization and Reprocessing (EMDR) to reduce the client's  emotional reactivity to the traumatic event and reduce PTSD symptoms. Objective Learn and implement calming skills. Target Date: 2023-02-03 Frequency: Biweekly Progress: 40 Modality: individual Related Interventions 1. Teach the client calming skills (e.g., breathing retraining, relaxation, calming self-talk) to use in and between sessions when feeling overly distressed. Objective Participate in Cognitive Therapy to help identify, challenge, and replace biased, negative, and selfdefeating thoughts resulting from the trauma. Target Date: 2023-02-03 Frequency: Biweekly Progress: 50 Modality: individual Related Interventions 1. Using Cognitive Therapy techniques, explore the client's self-talk and beliefs about self, others,  and the future that are a consequence of the trauma (e.g., themes of safety, trust, power, control,  esteem, and intimacy); identify and challenge biases; assist him/her in generating appraisals that correct for the biases; test biased and alternatives predictions through behavioral experiments. Objective Learn and implement guided self-dialogue to manage thoughts, feelings, and urges brought on by  encounters with trauma-related situations. Target Date: 2023-02-03 Frequency: Biweekly Progress: 60 Modality: individual Related Interventions 1. Teach the client a guided self-dialogue procedure in which he/she learns to recognize  maladaptive self-talk, challenges its biases, copes with engendered feelings, overcomes  avoidance, and reinforces his/her accomplishments; review and reinforce progress, problemsolve obstacles. Objective Learn and implement approaches for addressing shame and self-disparagement. Target Date: 2023-02-03 Frequency: Biweekly Progress: 70 Modality: individual Diagnosis F43.10 (Posttraumatic stress disorder) - Open -  [Signifier: n/a] Posttraumatic Stress  Disorder  Conditions For Discharge Achievement of treatment goals and objectives Patient is scheduled for her next session within the next two weeks. The patient approved this treatment plan.   Kristen Brown G Nakesha Ebrahim, LCSW                  Kaida Games G Corynn Solberg, LCSW               Tj Kitchings G Nicholi Ghuman, LCSW               Harvard Zeiss G Kelina Beauchamp, LCSW               Ruhan Borak G Tayden Nichelson, LCSW               Ryot Burrous G Dorla Guizar, LCSW               Daylani Deblois G Laurena Valko, LCSW               Torra Pala G Anjelique Makar, LCSW               Shadoe Bethel G Jolisa Intriago, LCSW               Latima Hamza G Payden Docter, LCSW               Benino Korinek G Wren Gallaga, LCSW

## 2022-04-06 ENCOUNTER — Ambulatory Visit: Payer: No Typology Code available for payment source | Admitting: Neurology

## 2022-04-06 DIAGNOSIS — G4719 Other hypersomnia: Secondary | ICD-10-CM

## 2022-04-06 DIAGNOSIS — G4733 Obstructive sleep apnea (adult) (pediatric): Secondary | ICD-10-CM

## 2022-04-06 NOTE — Progress Notes (Signed)
   [facility]  HOME SLEEP STUDY  STUDY DATE: 04/05/2022 PATIENT NAME: Kristen Brown DOB: 1976-06-26 MRN: [id number]  ORDERING CLINICIAN: Richard A. Epimenio Foot, MD, PhD REFERRING CLINICIAN: Richard A. Sater, MD. PhD   CLINICAL INFORMATION: 46 year old woman with obstructive sleep apnea and excessive daytime sleepiness   IMPRESSION:  Mild overall OSA with a pAHI 3% of 11.2/h Moderate REM related OSA with a REM pAHI 3% of 19.8/h Negligible sleep-related hypoxemia with 0.2 minutes below 88%. Epworth sleepiness score 20/24 (severe daytime excessive sleepiness)   RECOMMENDATION: 1.  For the OSA that is mild overall but moderate during REM sleep and excessive daytime sleepiness, recommend AutoPap 5-20 cm H2O with download in 30 to 90 days 2.  Follow-up with Dr. Epimenio Foot   INTERPRETING PHYSICIAN:   Pearletha Furl. Epimenio Foot, MD, PhD, [facility] Certified in Neurology, Clinical Neurophysiology, Sleep Medicine, Pain Medicine and Neuroimaging  [facility] [address] [place], [place] [address] [phone]

## 2022-04-07 ENCOUNTER — Other Ambulatory Visit: Payer: Self-pay | Admitting: Neurology

## 2022-04-07 ENCOUNTER — Telehealth: Payer: Self-pay | Admitting: Neurology

## 2022-04-07 DIAGNOSIS — G4719 Other hypersomnia: Secondary | ICD-10-CM

## 2022-04-07 DIAGNOSIS — G4733 Obstructive sleep apnea (adult) (pediatric): Secondary | ICD-10-CM

## 2022-04-07 DIAGNOSIS — R5383 Other fatigue: Secondary | ICD-10-CM

## 2022-04-07 NOTE — Telephone Encounter (Signed)
I called pt. I advised pt that Dr. Epimenio Foot reviewed their sleep study results and found that pt has mild to moderate OSA. Dr. Epimenio Foot recommends that pt starts auto CPAP. I reviewed PAP compliance expectations with the pt. Pt is agreeable to starting a CPAP. I advised pt that an order will be sent to a DME, [facility], and [facility] will call the pt within about one week after they file with the pt's insurance. [facility] will show the pt how to use the machine, fit for masks, and troubleshoot the CPAP if needed. A follow up appt was made for insurance purposes with Shawnie Dapper, NP on 06/15/2022 at 9 am. Pt verbalized understanding to arrive 15 minutes early and bring their CPAP. A letter with all of this information in it will be mailed to the pt as a reminder. I verified with the pt that the address we have on file is correct. Pt verbalized understanding of results. Pt had no questions at this time but was encouraged to call back if questions arise. I have sent the order to [facility] and have received confirmation that they have received the order.    *Please let her know that the home sleep study showed that she had mild obstructive sleep apnea overall but it was moderate during REM sleep.  I recommend that she begin AutoPap 5-20 cm with download in 31 to 90 days and follow-up with me in about 3 months

## 2022-04-13 ENCOUNTER — Ambulatory Visit (INDEPENDENT_AMBULATORY_CARE_PROVIDER_SITE_OTHER): Payer: No Typology Code available for payment source | Admitting: Psychology

## 2022-04-13 DIAGNOSIS — F423 Hoarding disorder: Secondary | ICD-10-CM

## 2022-04-13 DIAGNOSIS — F4310 Post-traumatic stress disorder, unspecified: Secondary | ICD-10-CM | POA: Diagnosis not present

## 2022-04-13 NOTE — Progress Notes (Signed)
[facility] Counselor/Therapist Progress Note  Patient ID: Kristen Brown, MRN: [id number],    Date: 04/13/2022  Time Spent: 60 minutes  Treatment Type: Individual Therapy  Reported Symptoms: sadness, frustration  Mental Status Exam: Appearance:  Casual     Behavior: Appropriate  Motor: Normal  Speech/Language:  Normal Rate  Affect: anxious  Mood: anxious  Thought process: normal  Thought content:   WNL  Sensory/Perceptual disturbances:   WNL  Orientation: oriented to person, place, time/date, and situation  Attention: Good  Concentration: Good  Memory: WNL  Fund of knowledge:  Good  Insight:   Good  Judgment:  Good  Impulse Control: Good   Risk Assessment: Danger to Self:  No Self-injurious Behavior: No Danger to Others: No Duty to Warn:no Physical Aggression / Violence:No  Access to Firearms a concern: No  Gang Involvement:No   Subjective: The patient attended a face-to-face individual therapy session via video today.  The patient gave verbal consent for the video to be on WebEx.  The patient was alone in her car alone and the therapist was in the office.  Patient presents as pleasant and cooperative.  The patient reports that she still is somewhat anxious but she has not done much to help herself with that.  I asked the patient whether she had done much around her house and it does not seem like she has done very much to get herself organized there.  I encouraged her to try to do that so that she could take some of the stress off of her plate.  The patient talked about being involved with an old boyfriend and there is some concern about whether this is a good choice for her.  We talked about the need for her to spend her time sorting things and I recommended that she make a list so that she can get some things accomplished.  Interventions: Cognitive Behavioral Therapy, problem solving  Diagnosis:No diagnosis found.  Plan: Client Abilities/Strengths   Intelligent, ability for insight, motivated  Client Treatment Preferences  Outpatient individual therapy  Client Statement of Needs  "[name] feels that I need more help with my trauma than she can offer"  Treatment Level  Outpatient Individual therapy  Symptoms  Displays a significant decline in interest and engagement in activities.:  (Status:  improved). Displays significant psychological and/or physiological distress resulting from internal and  external clues that are reminiscent of the traumatic event.:(Status: maintained). Experiences disturbances in sleep.: (Status: improved). Experiences disturbing  and persistent thoughts, images, and/or perceptions of the traumatic event.:  (Status: improved). Experiences frequent nightmares.:  (Status: improved). Has  been exposed to a traumatic event involving actual or perceived threat of death or serious injury.: (Status: maintained). Impairment in social, occupational, or other areas of  functioning.:  (Status: improved). Intentionally avoids thoughts, feelings, or  discussions related to the traumatic event.: (Status: improved). Reports  response of intense fear, helplessness, or horror to the traumatic event.: (Status: improved). Symptoms present more than one month.:(Status: maintained).  Goals 1. Eliminate or reduce the negative impact trauma related symptoms have  on social, occupational, and family functioning. 2. No longer experiences intrusive event recollections, avoidance of event  reminders, intense arousal, or disinterest in activities or  relationships. Objective Verbalize any symptoms of depression, including any suicidal thoughts. Target Date: 2023-02-03 Frequency: Biweekly Progress: 70 Modality: individual Objective Participate in Eye Movement Desensitization and Reprocessing (EMDR) to reduce emotional distress  related to traumatic thoughts, feelings, and images. Target Date: 2023-02-03  Frequency: Biweekly Progress:  60 Modality: individual Related Interventions 1. Utilize Eye Movement Desensitization and Reprocessing (EMDR) to reduce the client's  emotional reactivity to the traumatic event and reduce PTSD symptoms. Objective Learn and implement calming skills. Target Date: 2023-02-03 Frequency: Biweekly Progress: 40 Modality: individual Related Interventions 1. Teach the client calming skills (e.g., breathing retraining, relaxation, calming self-talk) to use in and between sessions when feeling overly distressed. Objective Participate in Cognitive Therapy to help identify, challenge, and replace biased, negative, and selfdefeating thoughts resulting from the trauma. Target Date: 2023-02-03 Frequency: Biweekly Progress: 50 Modality: individual Related Interventions 1. Using Cognitive Therapy techniques, explore the client's self-talk and beliefs about self, others,  and the future that are a consequence of the trauma (e.g., themes of safety, trust, power, control,  esteem, and intimacy); identify and challenge biases; assist him/her in generating appraisals that correct for the biases; test biased and alternatives predictions through behavioral experiments. Objective Learn and implement guided self-dialogue to manage thoughts, feelings, and urges brought on by  encounters with trauma-related situations. Target Date: 2023-02-03 Frequency: Biweekly Progress: 60 Modality: individual Related Interventions 1. Teach the client a guided self-dialogue procedure in which he/she learns to recognize  maladaptive self-talk, challenges its biases, copes with engendered feelings, overcomes  avoidance, and reinforces his/her accomplishments; review and reinforce progress, problemsolve obstacles. Objective Learn and implement approaches for addressing shame and self-disparagement. Target Date: 2023-02-03 Frequency: Biweekly Progress: 70 Modality: individual Diagnosis F43.10 (Posttraumatic stress disorder) -  Open - [Signifier: n/a] Posttraumatic Stress  Disorder  Conditions For Discharge Achievement of treatment goals and objectives Patient is scheduled for her next session within the next two weeks. The patient approved this treatment plan.   Cerena Baine G Mahir Prabhakar, LCSW                  Malory Spurr G Bryon Parker, LCSW               Rakim Moone G Angelita Harnack, LCSW               Velita Quirk G Trystin Hargrove, LCSW               Jerrick Farve G Garen Woolbright, LCSW               Danyelle Brookover G Kohner Orlick, LCSW               Zyriah Mask G Revia Nghiem, LCSW               Elisandra Deshmukh G Lessie Funderburke, LCSW               Sander Remedios G Recardo Linn, LCSW               Taralee Marcus G Kimm Ungaro, LCSW               Michaiah Maiden G Lenorris Karger, LCSW               Vernella Niznik G Candon Caras, LCSW

## 2022-04-27 ENCOUNTER — Ambulatory Visit (INDEPENDENT_AMBULATORY_CARE_PROVIDER_SITE_OTHER): Payer: No Typology Code available for payment source | Admitting: Psychology

## 2022-04-27 DIAGNOSIS — F423 Hoarding disorder: Secondary | ICD-10-CM

## 2022-04-27 DIAGNOSIS — F4310 Post-traumatic stress disorder, unspecified: Secondary | ICD-10-CM | POA: Diagnosis not present

## 2022-04-27 NOTE — Progress Notes (Signed)
[facility] Counselor/Therapist Progress Note  Patient ID: Kristen Brown, MRN: [id number],    Date: 04/27/2022  Time Spent: 60 minutes  Treatment Type: Individual Therapy  Reported Symptoms: sadness, frustration  Mental Status Exam: Appearance:  Casual     Behavior: Appropriate  Motor: Normal  Speech/Language:  Normal Rate  Affect: anxious  Mood: anxious  Thought process: normal  Thought content:   WNL  Sensory/Perceptual disturbances:   WNL  Orientation: oriented to person, place, time/date, and situation  Attention: Good  Concentration: Good  Memory: WNL  Fund of knowledge:  Good  Insight:   Good  Judgment:  Good  Impulse Control: Good   Risk Assessment: Danger to Self:  No Self-injurious Behavior: No Danger to Others: No Duty to Warn:no Physical Aggression / Violence:No  Access to Firearms a concern: No  Gang Involvement:No   Subjective: The patient attended a face-to-face individual therapy session via video today.  The patient gave verbal consent for the video to be on WebEx.  The patient was alone in her car alone and the therapist was in the office.  The patient presents as somewhat anxious today.  The patient reports that she has been feeling guilty about leaving work early even with the [employer].  I worked with her specifically on trying to reframe her thoughts around this to help her understand that she at least is doing something to work and that she could already chosen to do disability.  We also talked about her looking for external validation and ways to make her feel better with relationships as opposed to doing what she needs to do to take care of herself.  The patient reports that she has been sleeping a lot more and she is concerned that her depression is coming back.  I explained to her that I thought that she was probably succumbing to her a way of coping and that that probably was not helpful for her.  I explained to her that I want her to  have her good feelings about herself coming from herself as opposed to someone externally.  I recommended that she try to feed herself right as she is only been eating 1 meal a day.  Interventions: Cognitive Behavioral Therapy, problem solving  Diagnosis:PTSD (post-traumatic stress disorder)  Hoarding disorder  Plan: Client Abilities/Strengths  Intelligent, ability for insight, motivated  Client Treatment Preferences  Outpatient individual therapy  Client Statement of Needs  "[name] feels that I need more help with my trauma than she can offer"  Treatment Level  Outpatient Individual therapy  Symptoms  Displays a significant decline in interest and engagement in activities.:  (Status:  improved). Displays significant psychological and/or physiological distress resulting from internal and  external clues that are reminiscent of the traumatic event.:(Status: maintained). Experiences disturbances in sleep.: (Status: improved). Experiences disturbing  and persistent thoughts, images, and/or perceptions of the traumatic event.:  (Status: improved). Experiences frequent nightmares.:  (Status: improved). Has  been exposed to a traumatic event involving actual or perceived threat of death or serious injury.: (Status: maintained). Impairment in social, occupational, or other areas of  functioning.:  (Status: improved). Intentionally avoids thoughts, feelings, or  discussions related to the traumatic event.: (Status: improved). Reports  response of intense fear, helplessness, or horror to the traumatic event.: (Status: improved). Symptoms present more than one month.:(Status: maintained).  Goals 1. Eliminate or reduce the negative impact trauma related symptoms have  on social, occupational, and family functioning. 2. No longer experiences intrusive  event recollections, avoidance of event  reminders, intense arousal, or disinterest in activities or  relationships. Objective Verbalize any  symptoms of depression, including any suicidal thoughts. Target Date: 2023-02-03 Frequency: Biweekly Progress: 70 Modality: individual Objective Participate in Eye Movement Desensitization and Reprocessing (EMDR) to reduce emotional distress  related to traumatic thoughts, feelings, and images. Target Date: 2023-02-03 Frequency: Biweekly Progress: 60 Modality: individual Related Interventions 1. Utilize Eye Movement Desensitization and Reprocessing (EMDR) to reduce the client's  emotional reactivity to the traumatic event and reduce PTSD symptoms. Objective Learn and implement calming skills. Target Date: 2023-02-03 Frequency: Biweekly Progress: 40 Modality: individual Related Interventions 1. Teach the client calming skills (e.g., breathing retraining, relaxation, calming self-talk) to use in and between sessions when feeling overly distressed. Objective Participate in Cognitive Therapy to help identify, challenge, and replace biased, negative, and selfdefeating thoughts resulting from the trauma. Target Date: 2023-02-03 Frequency: Biweekly Progress: 50 Modality: individual Related Interventions 1. Using Cognitive Therapy techniques, explore the client's self-talk and beliefs about self, others,  and the future that are a consequence of the trauma (e.g., themes of safety, trust, power, control,  esteem, and intimacy); identify and challenge biases; assist him/her in generating appraisals that correct for the biases; test biased and alternatives predictions through behavioral experiments. Objective Learn and implement guided self-dialogue to manage thoughts, feelings, and urges brought on by  encounters with trauma-related situations. Target Date: 2023-02-03 Frequency: Biweekly Progress: 60 Modality: individual Related Interventions 1. Teach the client a guided self-dialogue procedure in which he/she learns to recognize  maladaptive self-talk, challenges its biases, copes with  engendered feelings, overcomes  avoidance, and reinforces his/her accomplishments; review and reinforce progress, problemsolve obstacles. Objective Learn and implement approaches for addressing shame and self-disparagement. Target Date: 2023-02-03 Frequency: Biweekly Progress: 70 Modality: individual Diagnosis F43.10 (Posttraumatic stress disorder) - Open - [Signifier: n/a] Posttraumatic Stress  Disorder  Conditions For Discharge Achievement of treatment goals and objectives Patient is scheduled for her next session within the next two weeks. The patient approved this treatment plan.   Carlas Vandyne G Shemeka Wardle, LCSW                  Michale Weikel G Dre Gamino, LCSW               Cayman Brogden G Teruko Joswick, LCSW               Chyrl Elwell G Veronia Laprise, LCSW               Naydelin Ziegler G Saleena Tamas, LCSW               Oslo Huntsman G Hutch Rhett, LCSW               Felina Tello G Jaekwon Mcclune, LCSW               Hilde Churchman G Noemie Devivo, LCSW               Juleah Paradise G Bradden Tadros, LCSW               Michille Mcelrath G Maurisa Tesmer, LCSW               Marsella Suman G Kumiko Fishman, LCSW               Zalen Sequeira G Dorrance Sellick, LCSW               Kelton Bultman G Patriciaann Rabanal, LCSW

## 2022-05-11 ENCOUNTER — Ambulatory Visit (INDEPENDENT_AMBULATORY_CARE_PROVIDER_SITE_OTHER): Payer: No Typology Code available for payment source | Admitting: Psychology

## 2022-05-11 DIAGNOSIS — F4310 Post-traumatic stress disorder, unspecified: Secondary | ICD-10-CM | POA: Diagnosis not present

## 2022-05-11 DIAGNOSIS — F423 Hoarding disorder: Secondary | ICD-10-CM | POA: Diagnosis not present

## 2022-05-11 NOTE — Progress Notes (Signed)
[facility] Counselor/Therapist Progress Note  Patient ID: SHITAL CRAYTON, MRN: [id number],    Date: 05/11/2022  Time Spent: 60 minutes  Treatment Type: Individual Therapy  Reported Symptoms: sadness, frustration  Mental Status Exam: Appearance:  Casual     Behavior: Appropriate  Motor: Normal  Speech/Language:  Normal Rate  Affect: anxious  Mood: anxious  Thought process: normal  Thought content:   WNL  Sensory/Perceptual disturbances:   WNL  Orientation: oriented to person, place, time/date, and situation  Attention: Good  Concentration: Good  Memory: WNL  Fund of knowledge:  Good  Insight:   Good  Judgment:  Good  Impulse Control: Good   Risk Assessment: Danger to Self:  No Self-injurious Behavior: No Danger to Others: No Duty to Warn:no Physical Aggression / Violence:No  Access to Firearms a concern: No  Gang Involvement:No   Subjective: The patient attended a face-to-face individual therapy session via video today.  The patient gave verbal consent for the video to be on WebEx.  The patient was alone in her car alone and the therapist was in the office.  The patient presents as anxious today.  The patient reports that she has had a rough couple of days.  She states that yesterday she was having some ideation about not wanting to be around anymore but did not act on anything.  The issue came up because she had a bill collector contact her parents and her wanting more money and she ended up firing her mother the money from her son and selling a truck quickly so that she could pay her son back.  We talked about her focusing not on the negative event but that it would work out in the long run.  The patient struggles with money and has filed bankruptcy in the past and there is some concern that this may have been a scam to get some money however she feels better about the circumstance today.  Encouraged the patient to try to do what she needs to do to continue  to take care of herself and focus on positive coping strategies. Interventions: Cognitive Behavioral Therapy, problem solving  Diagnosis:PTSD (post-traumatic stress disorder)  Hoarding disorder  Plan: Client Abilities/Strengths  Intelligent, ability for insight, motivated  Client Treatment Preferences  Outpatient individual therapy  Client Statement of Needs  "[name] feels that I need more help with my trauma than she can offer"  Treatment Level  Outpatient Individual therapy  Symptoms  Displays a significant decline in interest and engagement in activities.:  (Status:  improved). Displays significant psychological and/or physiological distress resulting from internal and  external clues that are reminiscent of the traumatic event.:(Status: maintained). Experiences disturbances in sleep.: (Status: improved). Experiences disturbing  and persistent thoughts, images, and/or perceptions of the traumatic event.:  (Status: improved). Experiences frequent nightmares.:  (Status: improved). Has  been exposed to a traumatic event involving actual or perceived threat of death or serious injury.: (Status: maintained). Impairment in social, occupational, or other areas of  functioning.:  (Status: improved). Intentionally avoids thoughts, feelings, or  discussions related to the traumatic event.: (Status: improved). Reports  response of intense fear, helplessness, or horror to the traumatic event.: (Status: improved). Symptoms present more than one month.:(Status: maintained).  Goals 1. Eliminate or reduce the negative impact trauma related symptoms have  on social, occupational, and family functioning. 2. No longer experiences intrusive event recollections, avoidance of event  reminders, intense arousal, or disinterest in activities or  relationships. Objective Verbalize any  symptoms of depression, including any suicidal thoughts. Target Date: 2023-02-03 Frequency: Biweekly Progress: 70  Modality: individual Objective Participate in Eye Movement Desensitization and Reprocessing (EMDR) to reduce emotional distress  related to traumatic thoughts, feelings, and images. Target Date: 2023-02-03 Frequency: Biweekly Progress: 60 Modality: individual Related Interventions 1. Utilize Eye Movement Desensitization and Reprocessing (EMDR) to reduce the client's  emotional reactivity to the traumatic event and reduce PTSD symptoms. Objective Learn and implement calming skills. Target Date: 2023-02-03 Frequency: Biweekly Progress: 40 Modality: individual Related Interventions 1. Teach the client calming skills (e.g., breathing retraining, relaxation, calming self-talk) to use in and between sessions when feeling overly distressed. Objective Participate in Cognitive Therapy to help identify, challenge, and replace biased, negative, and selfdefeating thoughts resulting from the trauma. Target Date: 2023-02-03 Frequency: Biweekly Progress: 50 Modality: individual Related Interventions 1. Using Cognitive Therapy techniques, explore the client's self-talk and beliefs about self, others,  and the future that are a consequence of the trauma (e.g., themes of safety, trust, power, control,  esteem, and intimacy); identify and challenge biases; assist him/her in generating appraisals that correct for the biases; test biased and alternatives predictions through behavioral experiments. Objective Learn and implement guided self-dialogue to manage thoughts, feelings, and urges brought on by  encounters with trauma-related situations. Target Date: 2023-02-03 Frequency: Biweekly Progress: 60 Modality: individual Related Interventions 1. Teach the client a guided self-dialogue procedure in which he/she learns to recognize  maladaptive self-talk, challenges its biases, copes with engendered feelings, overcomes  avoidance, and reinforces his/her accomplishments; review and reinforce progress,  problemsolve obstacles. Objective Learn and implement approaches for addressing shame and self-disparagement. Target Date: 2023-02-03 Frequency: Biweekly Progress: 70 Modality: individual Diagnosis F43.10 (Posttraumatic stress disorder) - Open - [Signifier: n/a] Posttraumatic Stress  Disorder  Conditions For Discharge Achievement of treatment goals and objectives Patient is scheduled for her next session within the next two weeks. The patient approved this treatment plan.   Burnham Trost G Doll Frazee, LCSW                  Doni Bacha G Vyolet Sakuma, LCSW               Vieno Tarrant G Mercer Stallworth, LCSW               Niajah Sipos G Tucker Steedley, LCSW               Zakyah Yanes G Makinsley Schiavi, LCSW               Dany Harten G Judeth Gilles, LCSW               Advay Volante G Marquasha Brutus, LCSW               Glendene Wyer G Henrick Mcgue, LCSW               Onetta Spainhower G Malaney Mcbean, LCSW               Ahriana Gunkel G Malisa Ruggiero, LCSW               Katonya Blecher G Sunjai Levandoski, LCSW               Sem Mccaughey G Athalie Newhard, LCSW               Anna Livers G Penelopi Mikrut, LCSW               Mitsuye Schrodt G Armour Villanueva, LCSW

## 2022-05-20 ENCOUNTER — Emergency Department (HOSPITAL_BASED_OUTPATIENT_CLINIC_OR_DEPARTMENT_OTHER)
Admission: EM | Admit: 2022-05-20 | Discharge: 2022-05-20 | Disposition: A | Discharge status: Home / Self Care | Payer: No Typology Code available for payment source | Attending: Emergency Medicine | Admitting: Emergency Medicine

## 2022-05-20 ENCOUNTER — Other Ambulatory Visit: Payer: Self-pay

## 2022-05-20 ENCOUNTER — Encounter (HOSPITAL_BASED_OUTPATIENT_CLINIC_OR_DEPARTMENT_OTHER): Payer: Self-pay

## 2022-05-20 ENCOUNTER — Other Ambulatory Visit (HOSPITAL_BASED_OUTPATIENT_CLINIC_OR_DEPARTMENT_OTHER): Payer: Self-pay

## 2022-05-20 DIAGNOSIS — G35 Multiple sclerosis: Secondary | ICD-10-CM | POA: Diagnosis not present

## 2022-05-20 DIAGNOSIS — M542 Cervicalgia: Secondary | ICD-10-CM | POA: Insufficient documentation

## 2022-05-20 DIAGNOSIS — N3000 Acute cystitis without hematuria: Secondary | ICD-10-CM | POA: Insufficient documentation

## 2022-05-20 DIAGNOSIS — Z91040 Latex allergy status: Secondary | ICD-10-CM | POA: Insufficient documentation

## 2022-05-20 LAB — URINALYSIS, MICROSCOPIC (REFLEX)

## 2022-05-20 LAB — CBC WITH DIFFERENTIAL/PLATELET
Abs Immature Granulocytes: 0.08 10*3/uL — ABNORMAL HIGH (ref 0.00–0.07)
Basophils Absolute: 0.1 10*3/uL (ref 0.0–0.1)
Basophils Relative: 0 %
Eosinophils Absolute: 0.2 10*3/uL (ref 0.0–0.5)
Eosinophils Relative: 2 %
HCT: 39.3 % (ref 36.0–46.0)
Hemoglobin: 12.5 g/dL (ref 12.0–15.0)
Immature Granulocytes: 1 %
Lymphocytes Relative: 15 %
Lymphs Abs: 1.8 10*3/uL (ref 0.7–4.0)
MCH: 27.4 pg (ref 26.0–34.0)
MCHC: 31.8 g/dL (ref 30.0–36.0)
MCV: 86.2 fL (ref 80.0–100.0)
Monocytes Absolute: 0.7 10*3/uL (ref 0.1–1.0)
Monocytes Relative: 6 %
Neutro Abs: 9.2 10*3/uL — ABNORMAL HIGH (ref 1.7–7.7)
Neutrophils Relative %: 76 %
Platelets: 218 10*3/uL (ref 150–400)
RBC: 4.56 MIL/uL (ref 3.87–5.11)
RDW: 13.5 % (ref 11.5–15.5)
WBC: 12 10*3/uL — ABNORMAL HIGH (ref 4.0–10.5)
nRBC: 0 % (ref 0.0–0.2)

## 2022-05-20 LAB — BASIC METABOLIC PANEL
Anion gap: 9 (ref 5–15)
BUN: 12 mg/dL (ref 6–20)
CO2: 24 mmol/L (ref 22–32)
Calcium: 8.7 mg/dL — ABNORMAL LOW (ref 8.9–10.3)
Chloride: 105 mmol/L (ref 98–111)
Creatinine, Ser: 0.68 mg/dL (ref 0.44–1.00)
GFR, Estimated: >60 mL/min (ref >60)
Glucose, Bld: 131 mg/dL — ABNORMAL HIGH (ref 70–99)
Potassium: 3.7 mmol/L (ref 3.5–5.1)
Sodium: 138 mmol/L (ref 135–145)

## 2022-05-20 LAB — URINALYSIS, ROUTINE W REFLEX MICROSCOPIC
Bilirubin Urine: NEGATIVE
Glucose, UA: NEGATIVE mg/dL
Hgb urine dipstick: NEGATIVE
Ketones, ur: NEGATIVE mg/dL
Nitrite: NEGATIVE
Protein, ur: NEGATIVE mg/dL
Specific Gravity, Urine: 1.02 (ref 1.005–1.030)
pH: 5.5 (ref 5.0–8.0)

## 2022-05-20 MED ORDER — CEPHALEXIN 500 MG PO CAPS
500.0000 mg | ORAL_CAPSULE | Freq: Four times a day (QID) | ORAL | 0 refills | Status: DC
  Filled 2022-05-20: qty 28, 7d supply, fill #0

## 2022-05-20 NOTE — ED Provider Notes (Signed)
[facility] EMERGENCY DEPARTMENT Provider Note   CSN: [id number] Arrival date & time: 05/20/22  1152     History  Chief Complaint  Patient presents with   Headache    Kristen Brown is a 46 y.o. female.  Patient with a history of multiple sclerosis.  And occasionally she will get some incontinence with that.  So she did have some incontinence and that did not alarm her.  But which has been concerned about is the complaint of posterior neck pain to the base of the skull but really no true headache this been ongoing for 3 days.  No vomiting.  No visual changes.  No fevers no upper respiratory symptoms no sore throat no chest pain no shortness of breath no abdominal pain.  Patient had a telemedicine visit and they referred her to the ED for additional work-up.  I think they were focused on a concern for possible meningitis.  Patient in addition has a history of migraines which that is not like migraines.  Past medical history significant for migraines multiple sclerosis irritable bowel syndrome past surgical history significant for cholecystectomy.  Patient's non-smoker.  In addition patient without any new sensory or motor deficits.       Home Medications Prior to Admission medications   Medication Sig Start Date End Date Taking? Authorizing Provider  cephALEXin (KEFLEX) 500 MG capsule Take 1 capsule (500 mg total) by mouth 4 (four) times daily. 05/20/22  Yes Fredia Sorrow, MD  ARIPiprazole (ABILIFY) 5 MG tablet Take 5 mg by mouth daily. 01/26/22   [provider]  Armodafinil 250 MG tablet Take 1 tablet (250 mg total) by mouth daily. 03/07/22   Sater, Nanine Means, MD  Cholecalciferol (VITAMIN D3) 125 MCG (5000 UT) TABS Take 1 tablet by mouth daily.    [provider]  diclofenac (VOLTAREN) 75 MG EC tablet Take 1 tablet (75 mg total) by mouth 2 (two) times daily. 06/23/21   Leandrew Koyanagi, MD  DULoxetine (CYMBALTA) 60 MG capsule Take 60 mg by mouth daily.     [provider]  escitalopram (LEXAPRO) 20 MG tablet TAKE 1 TABLET BY MOUTH EVERY DAY 06/22/20   Mozingo, Berdie Ogren, NP  gabapentin (NEURONTIN) 300 MG capsule Take 1 capsule (300 mg total) by mouth 3 (three) times daily. 05/20/21   Sater, Nanine Means, MD  ibuprofen (ADVIL,MOTRIN) 800 MG tablet Take 1 tablet (800 mg total) by mouth 3 (three) times daily. 10/18/16   Rancour, Annie Main, MD  loratadine (CLARITIN) 10 MG tablet Take 10 mg by mouth daily.    [provider]  LORazepam (ATIVAN) 0.5 MG tablet Take 0.5 mg by mouth every 8 (eight) hours.    [provider]  magnesium gluconate (MAGONATE) 500 MG tablet Take 250 mg by mouth daily.    [provider]  norgestimate-ethinyl estradiol (ORTHO-CYCLEN) 0.25-35 MG-MCG tablet Take 1 tablet by mouth daily. 01/01/22   [provider]  ocrelizumab (OCREVUS) 300 MG/10ML injection Inject 600 mg into the vein every 6 (six) months.    Sater, Nanine Means, MD  SUMAtriptan (IMITREX) 50 MG tablet TAKE 1 TAB(S) ORALLY ONCE, CAN REPEAT 2 HOURS LATER IF NEEDED 01/22/18   [provider]      Allergies    Sulfa antibiotics, Adhesive [tape], and Latex    Review of Systems   Review of Systems  Constitutional:  Negative for chills and fever.  HENT:  Negative for ear pain and sore throat.   Eyes:  Negative for pain and visual disturbance.  Respiratory:  Negative for cough and shortness of breath.   Cardiovascular:  Negative for chest pain and palpitations.  Gastrointestinal:  Negative for abdominal pain and vomiting.  Genitourinary:  Negative for dysuria and hematuria.  Musculoskeletal:  Positive for neck pain. Negative for arthralgias, back pain and neck stiffness.  Skin:  Negative for color change and rash.  Neurological:  Negative for seizures, syncope, facial asymmetry, weakness, numbness and headaches.  All other systems reviewed and are negative.   Physical Exam Updated Vital Signs BP (!) 140/68   Pulse  75   Temp 98.1 F (36.7 C) (Oral)   Resp 16   Ht 1.778 m (5\' 10" )   Wt (!) 163.3 kg   LMP 05/13/2022   SpO2 100%   BMI 51.65 kg/m  Physical Exam Vitals and nursing note reviewed.  Constitutional:      General: She is not in acute distress.    Appearance: She is well-developed.  HENT:     Head: Normocephalic and atraumatic.  Eyes:     Conjunctiva/sclera: Conjunctivae normal.     Pupils: Pupils are equal, round, and reactive to light.  Cardiovascular:     Rate and Rhythm: Normal rate and regular rhythm.     Heart sounds: No murmur heard. Pulmonary:     Effort: Pulmonary effort is normal. No respiratory distress.     Breath sounds: Normal breath sounds.  Abdominal:     Palpations: Abdomen is soft.     Tenderness: There is no abdominal tenderness.  Musculoskeletal:        General: No swelling.     Cervical back: Normal range of motion and neck supple. No rigidity.  Skin:    General: Skin is warm and dry.     Capillary Refill: Capillary refill takes less than 2 seconds.  Neurological:     Mental Status: She is alert.     GCS: GCS eye subscore is 4. GCS verbal subscore is 5. GCS motor subscore is 6.     Sensory: No sensory deficit.     Motor: No weakness.  Psychiatric:        Mood and Affect: Mood normal.     ED Results / Procedures / Treatments   Labs (all labs ordered are listed, but only abnormal results are displayed) Labs Reviewed  URINALYSIS, ROUTINE W REFLEX MICROSCOPIC - Abnormal; Notable for the following components:      Result Value   APPearance HAZY (*)    Leukocytes,Ua SMALL (*)    All other components within normal limits  CBC WITH DIFFERENTIAL/PLATELET - Abnormal; Notable for the following components:   WBC 12.0 (*)    Neutro Abs 9.2 (*)    Abs Immature Granulocytes 0.08 (*)    All other components within normal limits  BASIC METABOLIC PANEL - Abnormal; Notable for the following components:   Glucose, Bld 131 (*)    Calcium 8.7 (*)    All other  components within normal limits  URINALYSIS, MICROSCOPIC (REFLEX) - Abnormal; Notable for the following components:   Bacteria, UA MANY (*)    All other components within normal limits  URINE CULTURE    EKG None  Radiology No results found.  Procedures Procedures    Medications Ordered in ED Medications - No data to display  ED Course/ Medical Decision Making/ A&P  Medical Decision Making Amount and/or Complexity of Data Reviewed Labs: ordered.  Risk Prescription drug management.   Patient's incontinence she says is definitely related to her MS.  She does not really have a headache that history of migraines before is really just neck soreness posteriorly no history of any injury or additional strain.  And it goes to the base of the skull but does not really result in any kind of headache.  No significant neck rigidity.  Not concerned about meningitis symptoms been ongoing for 3 days and patient without fever and nontoxic.  Work-up for urine though although no symptoms does show evidence of probable urinary tract infection with many bacteria and whites are 21-50 so urine sent for culture.  And we will go ahead and start on the antibiotic Keflex.  Basic metabolic panel sugars good electrolytes good renal functions good.  CBC mild leukocytosis with a white count of 12 and hemoglobin 12.5.  Platelets 218.  As stated patient is not toxic.  And clinically no concern for meningitis certainly no concern for bacterial meningitis.  Patient very nontoxic very comfortable.  We will have her take extract Tylenol and Motrin for the neck discomfort.  And will have her start on Keflex for the probable urinary tract infection.  We will have her follow-up with her primary care doctor and will return for any new or worse symptoms.  Work note provided.   Final Clinical Impression(s) / ED Diagnoses Final diagnoses:  Neck pain  MS (multiple sclerosis) (HCC)  Acute cystitis  without hematuria    Rx / DC Orders ED Discharge Orders          Ordered    cephALEXin (KEFLEX) 500 MG capsule  4 times daily        05/20/22 1420              Vanetta Mulders, MD 05/20/22 1426

## 2022-05-20 NOTE — Discharge Instructions (Signed)
For the cervical pain would recommend extra strength Tylenol Motrin or Aleve.  Work note provided.  Return for any new or worse symptoms like fevers or severe headache.  Work-up does show evidence of a urinary tract infection.  Take the Keflex as directed.  Urine sent for culture.  You will be notified if the type of bacteria growing is not covered by Keflex.  Make an appointment to follow-up with your regular doctor.

## 2022-05-20 NOTE — ED Triage Notes (Signed)
Patient c/o headache x 3 days with nausea.   Patient has history of MS and intermittently has loss of bowel and bladder. States "that sometimes happens to me"   Due to this the teledoc instructed her to come to the ED asap.

## 2022-05-22 LAB — URINE CULTURE

## 2022-05-25 ENCOUNTER — Ambulatory Visit (INDEPENDENT_AMBULATORY_CARE_PROVIDER_SITE_OTHER): Payer: No Typology Code available for payment source | Admitting: Psychology

## 2022-05-25 DIAGNOSIS — F4310 Post-traumatic stress disorder, unspecified: Secondary | ICD-10-CM

## 2022-05-25 DIAGNOSIS — F423 Hoarding disorder: Secondary | ICD-10-CM | POA: Diagnosis not present

## 2022-05-25 NOTE — Progress Notes (Signed)
[facility] Counselor/Therapist Progress Note  Patient ID: Kristen Brown, MRN: [id number],    Date: 05/25/2022  Time Spent: 60 minutes  Treatment Type: Individual Therapy  Reported Symptoms: sadness, frustration  Mental Status Exam: Appearance:  Casual     Behavior: Appropriate  Motor: Normal  Speech/Language:  Normal Rate  Affect: blunted  Mood: anxious  Thought process: normal  Thought content:   WNL  Sensory/Perceptual disturbances:   WNL  Orientation: oriented to person, place, time/date, and situation  Attention: Good  Concentration: Good  Memory: WNL  Fund of knowledge:  Good  Insight:   Good  Judgment:  Good  Impulse Control: Good   Risk Assessment: Danger to Self:  No Self-injurious Behavior: No Danger to Others: No Duty to Warn:no Physical Aggression / Violence:No  Access to Firearms a concern: No  Gang Involvement:No   Subjective: The patient attended a face-to-face individual therapy session via video today.  The patient gave verbal consent for the video to be on WebEx.  The patient was alone in her car alone and the therapist was in the office.  The patient presents as anxious today.  The patient reports that she had to go to the emergency room this week.  Come to find out the patient had a urinary tract infection and was almost septic.  We talked about the need for her to do a better job of taking care of herself and we talked about specifically keeping herself clean so that she does not have any more issues.  She states that in the next 2 weeks she plans to have the plumber come so that she can get her plumbing restored so she can take showers at home.  I recommended that she do that between now and her next session to hold her accountable.  This was a scary event for the patient and hopefully she will do a better job of caring for herself.  Interventions: Cognitive Behavioral Therapy, problem solving  Diagnosis:PTSD (post-traumatic stress  disorder)  Hoarding disorder  Plan: Client Abilities/Strengths  Intelligent, ability for insight, motivated  Client Treatment Preferences  Outpatient individual therapy  Client Statement of Needs  "Kristen Brown feels that I need more help with my trauma than she can offer"  Treatment Level  Outpatient Individual therapy  Symptoms  Displays a significant decline in interest and engagement in activities.:  (Status:  improved). Displays significant psychological and/or physiological distress resulting from internal and  external clues that are reminiscent of the traumatic event.:(Status: maintained). Experiences disturbances in sleep.: (Status: improved). Experiences disturbing  and persistent thoughts, images, and/or perceptions of the traumatic event.:  (Status: improved). Experiences frequent nightmares.:  (Status: improved). Has  been exposed to a traumatic event involving actual or perceived threat of death or serious injury.: (Status: maintained). Impairment in social, occupational, or other areas of  functioning.:  (Status: improved). Intentionally avoids thoughts, feelings, or  discussions related to the traumatic event.: (Status: improved). Reports  response of intense fear, helplessness, or horror to the traumatic event.: (Status: improved). Symptoms present more than one month.:(Status: maintained).  Goals 1. Eliminate or reduce the negative impact trauma related symptoms have  on social, occupational, and family functioning. 2. No longer experiences intrusive event recollections, avoidance of event  reminders, intense arousal, or disinterest in activities or  relationships. Objective Verbalize any symptoms of depression, including any suicidal thoughts. Target Date: 2023-02-03 Frequency: Biweekly Progress: 70 Modality: individual Objective Participate in Eye Movement Desensitization and Reprocessing (EMDR) to reduce emotional  distress  related to traumatic thoughts, feelings,  and images. Target Date: 2023-02-03 Frequency: Biweekly Progress: 60 Modality: individual Related Interventions 1. Utilize Eye Movement Desensitization and Reprocessing (EMDR) to reduce the client's  emotional reactivity to the traumatic event and reduce PTSD symptoms. Objective Learn and implement calming skills. Target Date: 2023-02-03 Frequency: Biweekly Progress: 40 Modality: individual Related Interventions 1. Teach the client calming skills (e.g., breathing retraining, relaxation, calming self-talk) to use in and between sessions when feeling overly distressed. Objective Participate in Cognitive Therapy to help identify, challenge, and replace biased, negative, and selfdefeating thoughts resulting from the trauma. Target Date: 2023-02-03 Frequency: Biweekly Progress: 50 Modality: individual Related Interventions 1. Using Cognitive Therapy techniques, explore the client's self-talk and beliefs about self, others,  and the future that are a consequence of the trauma (e.g., themes of safety, trust, power, control,  esteem, and intimacy); identify and challenge biases; assist him/her in generating appraisals that correct for the biases; test biased and alternatives predictions through behavioral experiments. Objective Learn and implement guided self-dialogue to manage thoughts, feelings, and urges brought on by  encounters with trauma-related situations. Target Date: 2023-02-03 Frequency: Biweekly Progress: 60 Modality: individual Related Interventions 1. Teach the client a guided self-dialogue procedure in which he/she learns to recognize  maladaptive self-talk, challenges its biases, copes with engendered feelings, overcomes  avoidance, and reinforces his/her accomplishments; review and reinforce progress, problemsolve obstacles. Objective Learn and implement approaches for addressing shame and self-disparagement. Target Date: 2023-02-03 Frequency: Biweekly Progress: 70  Modality: individual Diagnosis F43.10 (Posttraumatic stress disorder) - Open - [Signifier: n/a] Posttraumatic Stress  Disorder  Conditions For Discharge Achievement of treatment goals and objectives Patient is scheduled for her next session within the next two weeks. The patient approved this treatment plan.   Angla Delahunt G Tae Vonada, LCSW                                                                                                                                                                                                     Talmage Teaster G Osie Amparo, LCSW

## 2022-06-08 ENCOUNTER — Ambulatory Visit (INDEPENDENT_AMBULATORY_CARE_PROVIDER_SITE_OTHER): Payer: No Typology Code available for payment source | Admitting: Psychology

## 2022-06-08 DIAGNOSIS — F4310 Post-traumatic stress disorder, unspecified: Secondary | ICD-10-CM | POA: Diagnosis not present

## 2022-06-08 DIAGNOSIS — F423 Hoarding disorder: Secondary | ICD-10-CM | POA: Diagnosis not present

## 2022-06-08 NOTE — Progress Notes (Signed)
[place] Counselor/Therapist Progress Note  Patient ID: Kristen Brown, MRN: [id number],    Date: 06/08/2022  Time Spent: 60 minutes  Treatment Type: Individual Therapy  Reported Symptoms: sadness, frustration  Mental Status Exam: Appearance:  Casual     Behavior: Appropriate  Motor: Normal  Speech/Language:  Normal Rate  Affect: blunted  Mood: pleasant  Thought process: normal  Thought content:   WNL  Sensory/Perceptual disturbances:   WNL  Orientation: oriented to person, place, time/date, and situation  Attention: Good  Concentration: Good  Memory: WNL  Fund of knowledge:  Good  Insight:   Good  Judgment:  Good  Impulse Control: Good   Risk Assessment: Danger to Self:  No Self-injurious Behavior: No Danger to Others: No Duty to Warn:no Physical Aggression / Violence:No  Access to Firearms a concern: No  Gang Involvement:No   Subjective: The patient attended a face-to-face individual therapy session via video today.  The patient gave verbal consent for the video to be on WebEx.  The patient was alone in her car alone and the therapist was in the office.  The patient presents as pleasant and cooperative.  The patient reports that she has had some issues at work with him questioning her hours and she is frustrated by this.  We talked about her having FMLA and talked about a course of action if they continue to have issues with her leaving early.  The patient also talked about having a possibility of a couple of job opportunities.  One of them sounds like it is remote work and we did some problem solving to come up with a plan for her to be able to look into that further.  The issue is is that the patient does not have Internet service at her home and we talked about ways that she might be able to work around that.  The patient is doing very well and I encouraged her to continue to take care of what she needs to take care of for herself as she is not very  good at doing that.  We also talked about some possibilities related to weight loss and I told her about healthy weight and wellness and some other programs that may be helpful for her to follow through with. Interventions: Cognitive Behavioral Therapy, problem solving  Diagnosis:PTSD (post-traumatic stress disorder)  Hoarding disorder  Plan: Client Abilities/Strengths  Intelligent, ability for insight, motivated  Client Treatment Preferences  Outpatient individual therapy  Client Statement of Needs  "[name] feels that I need more help with my trauma than she can offer"  Treatment Level  Outpatient Individual therapy  Symptoms  Displays a significant decline in interest and engagement in activities.:  (Status:  improved). Displays significant psychological and/or physiological distress resulting from internal and  external clues that are reminiscent of the traumatic event.:(Status: maintained). Experiences disturbances in sleep.: (Status: improved). Experiences disturbing  and persistent thoughts, images, and/or perceptions of the traumatic event.:  (Status: improved). Experiences frequent nightmares.:  (Status: improved). Has  been exposed to a traumatic event involving actual or perceived threat of death or serious injury.: (Status: maintained). Impairment in social, occupational, or other areas of  functioning.:  (Status: improved). Intentionally avoids thoughts, feelings, or  discussions related to the traumatic event.: (Status: improved). Reports  response of intense fear, helplessness, or horror to the traumatic event.: (Status: improved). Symptoms present more than one month.:(Status: maintained).  Goals 1. Eliminate or reduce the negative impact trauma related symptoms have  on social, occupational, and family functioning. 2. No longer experiences intrusive event recollections, avoidance of event  reminders, intense arousal, or disinterest in activities or   relationships. Objective Verbalize any symptoms of depression, including any suicidal thoughts. Target Date: 2023-02-03 Frequency: Biweekly Progress: 70 Modality: individual Objective Participate in Eye Movement Desensitization and Reprocessing (EMDR) to reduce emotional distress  related to traumatic thoughts, feelings, and images. Target Date: 2023-02-03 Frequency: Biweekly Progress: 60 Modality: individual Related Interventions 1. Utilize Eye Movement Desensitization and Reprocessing (EMDR) to reduce the client's  emotional reactivity to the traumatic event and reduce PTSD symptoms. Objective Learn and implement calming skills. Target Date: 2023-02-03 Frequency: Biweekly Progress: 40 Modality: individual Related Interventions 1. Teach the client calming skills (e.g., breathing retraining, relaxation, calming self-talk) to use in and between sessions when feeling overly distressed. Objective Participate in Cognitive Therapy to help identify, challenge, and replace biased, negative, and selfdefeating thoughts resulting from the trauma. Target Date: 2023-02-03 Frequency: Biweekly Progress: 50 Modality: individual Related Interventions 1. Using Cognitive Therapy techniques, explore the client's self-talk and beliefs about self, others,  and the future that are a consequence of the trauma (e.g., themes of safety, trust, power, control,  esteem, and intimacy); identify and challenge biases; assist him/her in generating appraisals that correct for the biases; test biased and alternatives predictions through behavioral experiments. Objective Learn and implement guided self-dialogue to manage thoughts, feelings, and urges brought on by  encounters with trauma-related situations. Target Date: 2023-02-03 Frequency: Biweekly Progress: 60 Modality: individual Related Interventions 1. Teach the client a guided self-dialogue procedure in which he/she learns to recognize  maladaptive  self-talk, challenges its biases, copes with engendered feelings, overcomes  avoidance, and reinforces his/her accomplishments; review and reinforce progress, problemsolve obstacles. Objective Learn and implement approaches for addressing shame and self-disparagement. Target Date: 2023-02-03 Frequency: Biweekly Progress: 70 Modality: individual Diagnosis F43.10 (Posttraumatic stress disorder) - Open - [Signifier: n/a] Posttraumatic Stress  Disorder  Conditions For Discharge Achievement of treatment goals and objectives Patient is scheduled for her next session within the next two weeks. The patient approved this treatment plan.   Koya Hunger G Freyja Govea, LCSW                                                                                                                                                                                                     Jerryl Holzhauer G Silas Muff, LCSW

## 2022-06-13 ENCOUNTER — Encounter: Payer: Self-pay | Admitting: Neurology

## 2022-06-14 ENCOUNTER — Other Ambulatory Visit: Payer: Self-pay | Admitting: Neurology

## 2022-06-15 ENCOUNTER — Ambulatory Visit: Payer: No Typology Code available for payment source | Admitting: Family Medicine

## 2022-06-21 ENCOUNTER — Telehealth: Payer: Self-pay | Admitting: Neurology

## 2022-06-21 NOTE — Telephone Encounter (Signed)
Phone rep called pt to schedule the initial CPAP f/u, no one available no option to leave a vm. Received message to try call another time

## 2022-06-21 NOTE — Telephone Encounter (Signed)
Pt was scheduled for her initial CPAP on (08-17-22) Pt was informed to bring machine and power cord to the appointment.   DME-[facility] and between dates -07/16/22 -09/14/22  pt informed she needs to bring CPAP and power cord to appointment

## 2022-06-22 ENCOUNTER — Ambulatory Visit: Payer: No Typology Code available for payment source | Admitting: Psychology

## 2022-06-29 ENCOUNTER — Ambulatory Visit: Payer: No Typology Code available for payment source | Admitting: Psychology

## 2022-06-29 ENCOUNTER — Ambulatory Visit (INDEPENDENT_AMBULATORY_CARE_PROVIDER_SITE_OTHER): Payer: No Typology Code available for payment source | Admitting: Psychology

## 2022-06-29 DIAGNOSIS — F4310 Post-traumatic stress disorder, unspecified: Secondary | ICD-10-CM | POA: Diagnosis not present

## 2022-06-29 DIAGNOSIS — F423 Hoarding disorder: Secondary | ICD-10-CM | POA: Diagnosis not present

## 2022-06-30 NOTE — Progress Notes (Signed)
[place] Counselor/Therapist Progress Note  Patient ID: Kristen Brown, MRN: [id number],    Date: 06/29/2022  Time Spent: 60 minutes  Treatment Type: Individual Therapy  Reported Symptoms: sadness, frustration  Mental Status Exam: Appearance:  Casual     Behavior: Appropriate  Motor: Normal  Speech/Language:  Normal Rate  Affect: blunted  Mood: pleasant  Thought process: normal  Thought content:   WNL  Sensory/Perceptual disturbances:   WNL  Orientation: oriented to person, place, time/date, and situation  Attention: Good  Concentration: Good  Memory: WNL  Fund of knowledge:  Good  Insight:   Good  Judgment:  Good  Impulse Control: Good   Risk Assessment: Danger to Self:  No Self-injurious Behavior: No Danger to Others: No Duty to Warn:no Physical Aggression / Violence:No  Access to Firearms a concern: No  Gang Involvement:No   Subjective: The patient attended a face-to-face individual therapy session via video today.  The patient gave verbal consent for the video to be on WebEx.  The patient was alone in her car alone and the therapist was in the office.  The patient presents with a blunted affect and mood is apathetic.  The patient reports that she just had her infusion for her MS and she had a allergic response.  The patient reports that she is glad that she could have it.  She states that she is still having trouble getting anything done and she says that she "feels lazy."  We talked about the message that she was giving herself and I believe that this message is related to her not feeling good enough because of her critical parents.  The patient is immobilized by the overwhelm of having to deal with them house that she lives in.  We talked about coming up with a plan so that she could continue to get the house cleaned out and functional and she also has started a new eating program.  The patient states she feels good about going and taking care of  herself and we talked about her focusing on that right now and possibly doing 1 thing at a time per evening to help get rid of some of the things in her home.  Between now and her next session I asked that the patient speak with her son about renting a [employer] so that they can haul off some of the garbage this in their house.  The patient continues to have somewhat of a hoarding issue and I think some of her issue is related to her beating herself up and not feeling like she deserves to have good things.  We may need to continue to do some EMDR related to that concept.  Interventions: Cognitive Behavioral Therapy, problem solving  Diagnosis:PTSD (post-traumatic stress disorder)  Hoarding disorder  Plan: Client Abilities/Strengths  Intelligent, ability for insight, motivated  Client Treatment Preferences  Outpatient individual therapy  Client Statement of Needs  "Kristen Brown feels that I need more help with my trauma than she can offer"  Treatment Level  Outpatient Individual therapy  Symptoms  Displays a significant decline in interest and engagement in activities.:  (Status:  improved). Displays significant psychological and/or physiological distress resulting from internal and  external clues that are reminiscent of the traumatic event.:(Status: maintained). Experiences disturbances in sleep.: (Status: improved). Experiences disturbing  and persistent thoughts, images, and/or perceptions of the traumatic event.:  (Status: improved). Experiences frequent nightmares.:  (Status: improved). Has  been exposed to a traumatic event involving actual  or perceived threat of death or serious injury.: (Status: maintained). Impairment in social, occupational, or other areas of  functioning.:  (Status: improved). Intentionally avoids thoughts, feelings, or  discussions related to the traumatic event.: (Status: improved). Reports  response of intense fear, helplessness, or horror to the traumatic event.:  (Status: improved). Symptoms present more than one month.:(Status: maintained).  Goals 1. Eliminate or reduce the negative impact trauma related symptoms have  on social, occupational, and family functioning. 2. No longer experiences intrusive event recollections, avoidance of event  reminders, intense arousal, or disinterest in activities or  relationships. Objective Verbalize any symptoms of depression, including any suicidal thoughts. Target Date: 2023-02-03 Frequency: Biweekly Progress: 70 Modality: individual Objective Participate in Eye Movement Desensitization and Reprocessing (EMDR) to reduce emotional distress  related to traumatic thoughts, feelings, and images. Target Date: 2023-02-03 Frequency: Biweekly Progress: 60 Modality: individual Related Interventions 1. Utilize Eye Movement Desensitization and Reprocessing (EMDR) to reduce the client's  emotional reactivity to the traumatic event and reduce PTSD symptoms. Objective Learn and implement calming skills. Target Date: 2023-02-03 Frequency: Biweekly Progress: 40 Modality: individual Related Interventions 1. Teach the client calming skills (e.g., breathing retraining, relaxation, calming self-talk) to use in and between sessions when feeling overly distressed. Objective Participate in Cognitive Therapy to help identify, challenge, and replace biased, negative, and selfdefeating thoughts resulting from the trauma. Target Date: 2023-02-03 Frequency: Biweekly Progress: 50 Modality: individual Related Interventions 1. Using Cognitive Therapy techniques, explore the client's self-talk and beliefs about self, others,  and the future that are a consequence of the trauma (e.g., themes of safety, trust, power, control,  esteem, and intimacy); identify and challenge biases; assist him/her in generating appraisals that correct for the biases; test biased and alternatives predictions through behavioral  experiments. Objective Learn and implement guided self-dialogue to manage thoughts, feelings, and urges brought on by  encounters with trauma-related situations. Target Date: 2023-02-03 Frequency: Biweekly Progress: 60 Modality: individual Related Interventions 1. Teach the client a guided self-dialogue procedure in which he/she learns to recognize  maladaptive self-talk, challenges its biases, copes with engendered feelings, overcomes  avoidance, and reinforces his/her accomplishments; review and reinforce progress, problemsolve obstacles. Objective Learn and implement approaches for addressing shame and self-disparagement. Target Date: 2023-02-03 Frequency: Biweekly Progress: 70 Modality: individual Diagnosis F43.10 (Posttraumatic stress disorder) - Open -signifier: n/a posttraumatic Stress  Disorder  Conditions For Discharge Achievement of treatment goals and objectives Patient is scheduled for her next session within the next two weeks. The patient approved this treatment plan.   Kyser Wandel G Viera Okonski, LCSW

## 2022-07-06 ENCOUNTER — Ambulatory Visit (INDEPENDENT_AMBULATORY_CARE_PROVIDER_SITE_OTHER): Payer: No Typology Code available for payment source | Admitting: Psychology

## 2022-07-06 DIAGNOSIS — F423 Hoarding disorder: Secondary | ICD-10-CM

## 2022-07-06 DIAGNOSIS — F4310 Post-traumatic stress disorder, unspecified: Secondary | ICD-10-CM

## 2022-07-06 NOTE — Progress Notes (Signed)
[facility] Counselor/Therapist Progress Note  Patient ID: Kristen Brown, MRN: [id number],    Date: 07/06/2022  Time Spent: 60 minutes  Treatment Type: Individual Therapy  Reported Symptoms: sadness, frustration  Mental Status Exam: Appearance:  Casual     Behavior: Appropriate  Motor: Normal  Speech/Language:  Normal Rate  Affect: blunted  Mood: pleasant  Thought process: normal  Thought content:   WNL  Sensory/Perceptual disturbances:   WNL  Orientation: oriented to person, place, time/date, and situation  Attention: Good  Concentration: Good  Memory: WNL  Fund of knowledge:  Good  Insight:   Good  Judgment:  Good  Impulse Control: Good   Risk Assessment: Danger to Self:  No Self-injurious Behavior: No Danger to Others: No Duty to Warn:no Physical Aggression / Violence:No  Access to Firearms a concern: No  Gang Involvement:No   Subjective: The patient attended a face-to-face individual therapy session via video today.  The patient gave verbal consent for the video to be on WebEx.  The patient was alone in her car alone and the therapist was in the office.  The patient presents with a blunted affect and mood is pleasant.  The patient reports that she feels better than she did the last time I talked to her because she had her infusion.  The patient had make-up on and seems to be doing somewhat better.  The patient says that she called a plumber and they are supposed to be coming to her house when she can get the money together to do that.  In addition the patient states that she is putting 1 thing in a bag a day and it is going slow but I explained to her that she needs to not worry about it going slow that eventually she would have a whole bag filled up and no time and that she would feel better because she was making some progress towards cleaning out her house.  The patient reports that she feels like she is doing a little better than she was and she is  doing a better job of taking care of herself.  She went to her medical provider that is helping her with weight loss and she has lost 8 pounds in the last 2 weeks.  We continue to work on reframing and helping the patient not be critical of herself and make better decisions for self-care.  Interventions: Cognitive Behavioral Therapy, problem solving  Diagnosis:PTSD (post-traumatic stress disorder)  Hoarding disorder  Plan: Client Abilities/Strengths  Intelligent, ability for insight, motivated  Client Treatment Preferences  Outpatient individual therapy  Client Statement of Needs  "[name] feels that I need more help with my trauma than she can offer"  Treatment Level  Outpatient Individual therapy  Symptoms  Displays a significant decline in interest and engagement in activities.:  (Status:  improved). Displays significant psychological and/or physiological distress resulting from internal and  external clues that are reminiscent of the traumatic event.:(Status: maintained). Experiences disturbances in sleep.: (Status: improved). Experiences disturbing  and persistent thoughts, images, and/or perceptions of the traumatic event.:  (Status: improved). Experiences frequent nightmares.:  (Status: improved). Has  been exposed to a traumatic event involving actual or perceived threat of death or serious injury.: (Status: maintained). Impairment in social, occupational, or other areas of  functioning.:  (Status: improved). Intentionally avoids thoughts, feelings, or  discussions related to the traumatic event.: (Status: improved). Reports  response of intense fear, helplessness, or horror to the traumatic event.: (Status: improved).  Symptoms present more than one month.:(Status: maintained).  Goals 1. Eliminate or reduce the negative impact trauma related symptoms have  on social, occupational, and family functioning. 2. No longer experiences intrusive event recollections, avoidance of event   reminders, intense arousal, or disinterest in activities or  relationships. Objective Verbalize any symptoms of depression, including any suicidal thoughts. Target Date: 2023-02-03 Frequency: Biweekly Progress: 70 Modality: individual Objective Participate in Eye Movement Desensitization and Reprocessing (EMDR) to reduce emotional distress  related to traumatic thoughts, feelings, and images. Target Date: 2023-02-03 Frequency: Biweekly Progress: 60 Modality: individual Related Interventions 1. Utilize Eye Movement Desensitization and Reprocessing (EMDR) to reduce the client's  emotional reactivity to the traumatic event and reduce PTSD symptoms. Objective Learn and implement calming skills. Target Date: 2023-02-03 Frequency: Biweekly Progress: 40 Modality: individual Related Interventions 1. Teach the client calming skills (e.g., breathing retraining, relaxation, calming self-talk) to use in and between sessions when feeling overly distressed. Objective Participate in Cognitive Therapy to help identify, challenge, and replace biased, negative, and selfdefeating thoughts resulting from the trauma. Target Date: 2023-02-03 Frequency: Biweekly Progress: 50 Modality: individual Related Interventions 1. Using Cognitive Therapy techniques, explore the client's self-talk and beliefs about self, others,  and the future that are a consequence of the trauma (e.g., themes of safety, trust, power, control,  esteem, and intimacy); identify and challenge biases; assist him/her in generating appraisals that correct for the biases; test biased and alternatives predictions through behavioral experiments. Objective Learn and implement guided self-dialogue to manage thoughts, feelings, and urges brought on by  encounters with trauma-related situations. Target Date: 2023-02-03 Frequency: Biweekly Progress: 60 Modality: individual Related Interventions 1. Teach the client a guided self-dialogue  procedure in which he/she learns to recognize  maladaptive self-talk, challenges its biases, copes with engendered feelings, overcomes  avoidance, and reinforces his/her accomplishments; review and reinforce progress, problemsolve obstacles. Objective Learn and implement approaches for addressing shame and self-disparagement. Target Date: 2023-02-03 Frequency: Biweekly Progress: 70 Modality: individual Diagnosis F43.10 (Posttraumatic stress disorder) - Open -signifier: n/a posttraumatic Stress  Disorder  Conditions For Discharge Achievement of treatment goals and objectives Patient is scheduled for her next session within the next two weeks. The patient approved this treatment plan.   Lache Dagher G Rajinder Mesick, LCSW

## 2022-07-12 IMAGING — MG MM DIGITAL SCREENING BILAT W/ TOMO AND CAD
8 series · 8 of 24 positions shown · non-contrast
Comparison: Previous exam(s).

CLINICAL DATA: Screening.

EXAM:
DIGITAL SCREENING BILATERAL MAMMOGRAM WITH TOMOSYNTHESIS AND CAD
TECHNIQUE: Bilateral screening digital craniocaudal and mediolateral oblique
mammograms were obtained. Bilateral screening digital breast
tomosynthesis was performed. The images were evaluated with
computer-aided detection.

[L CC synth-2D]
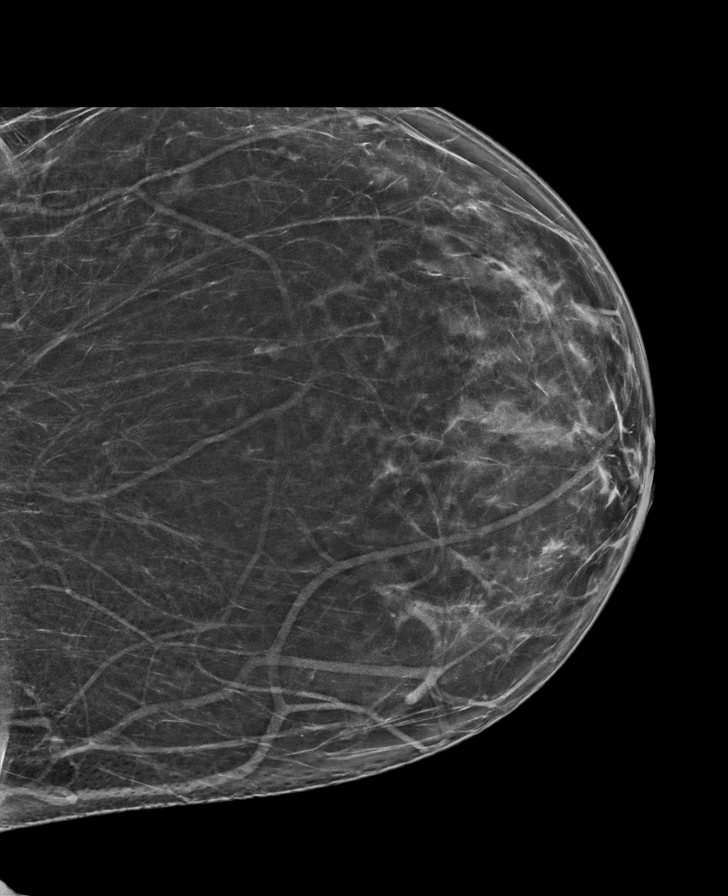

[R CC synth-2D]
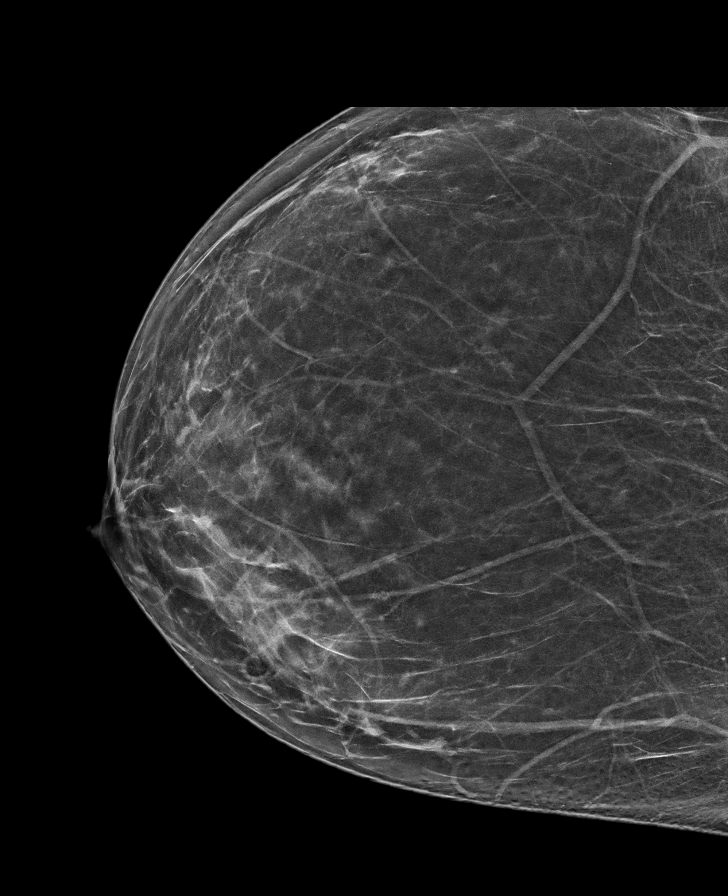

[R MLO synth-2D]
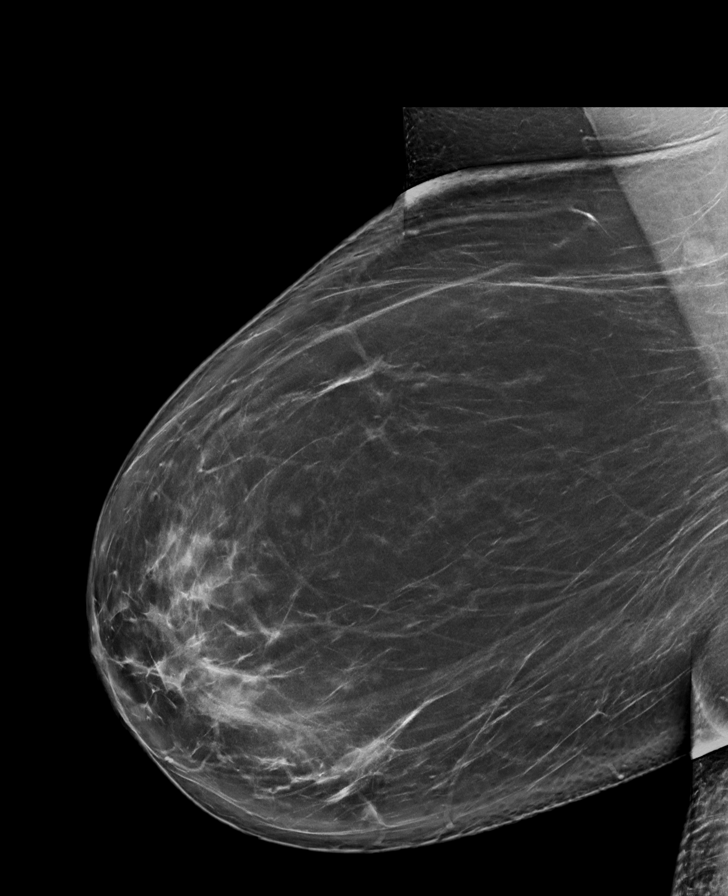

[L MLO synth-2D]
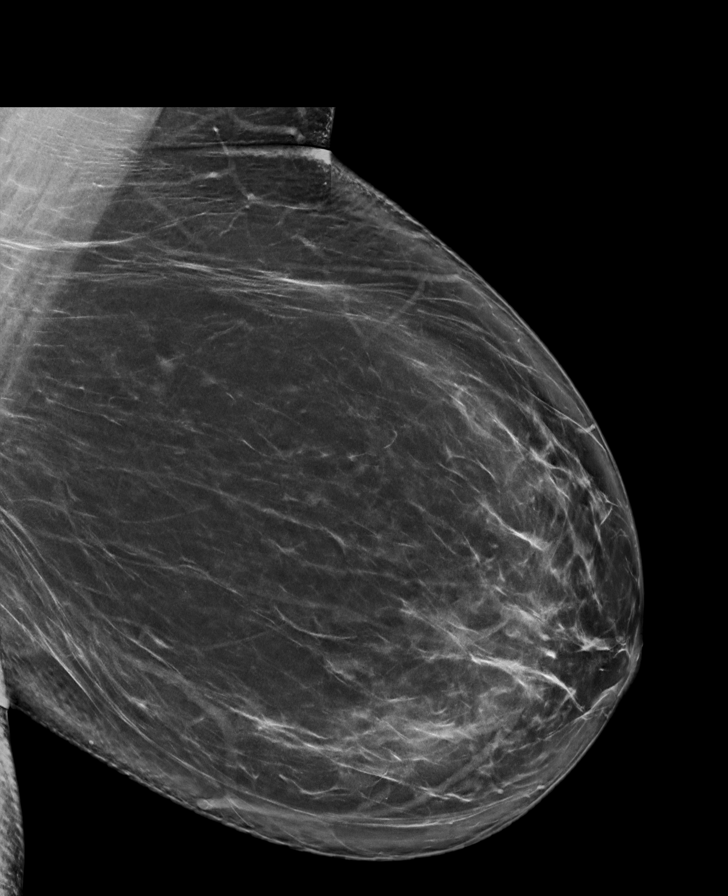

[R MLO tomo · tomo slice 51/101.0]
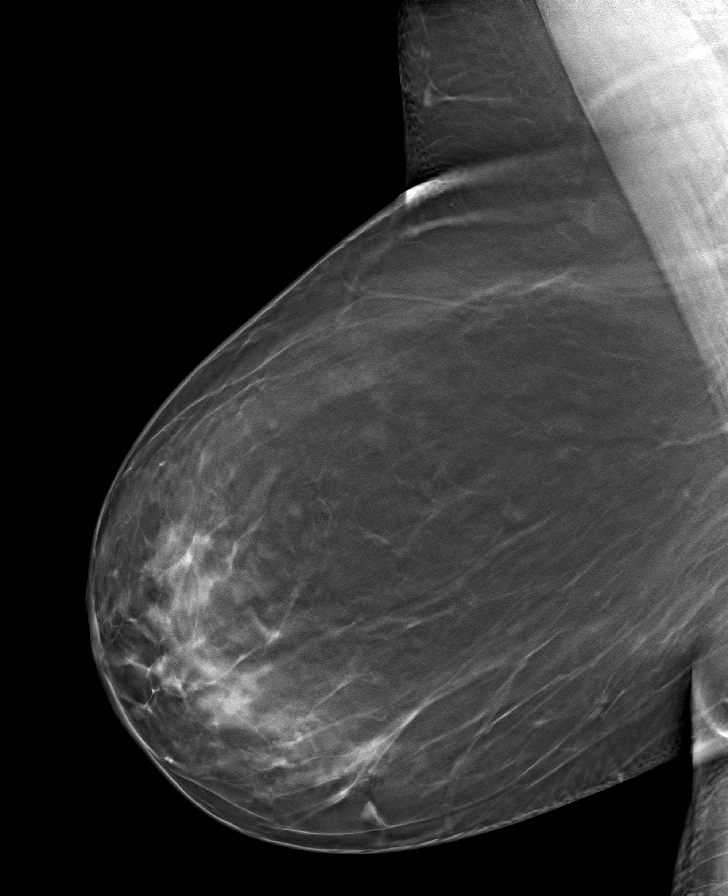

[R CC tomo · tomo slice 39/76.0]
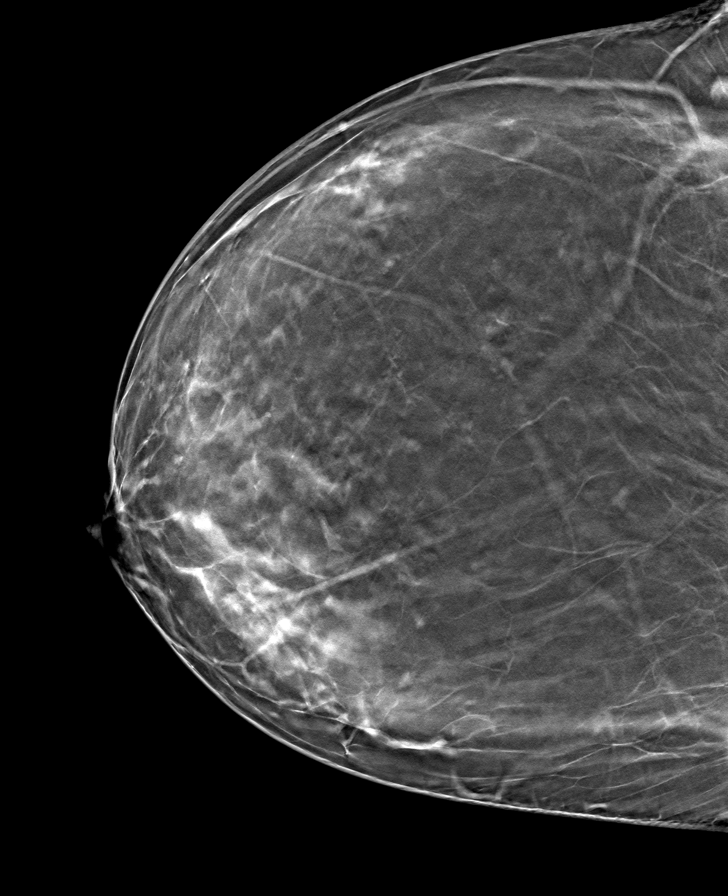

[L MLO tomo · tomo slice 49/98.0]
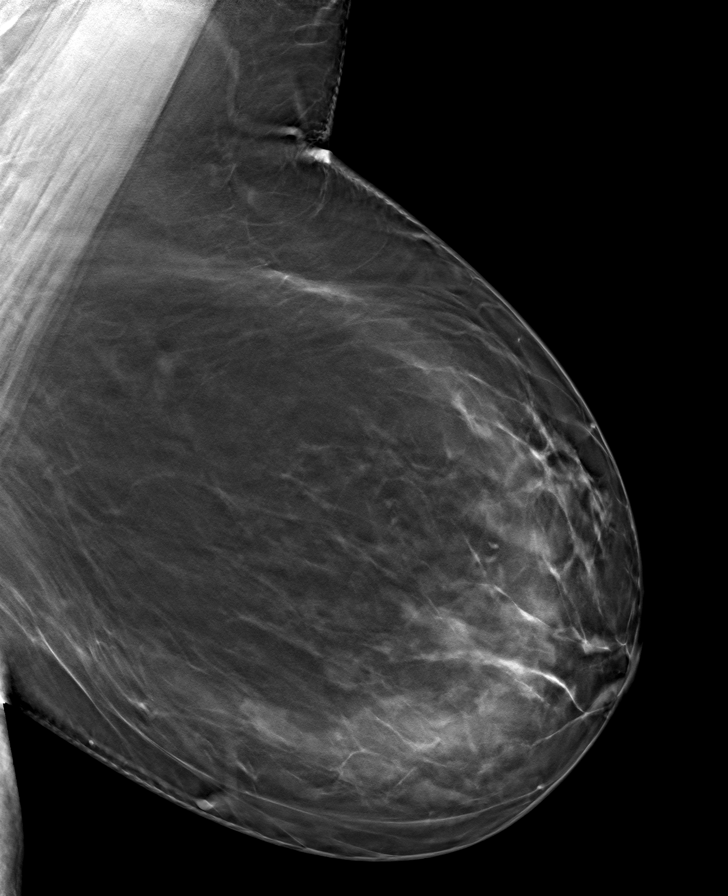

[L CC tomo · tomo slice 40/79.0]
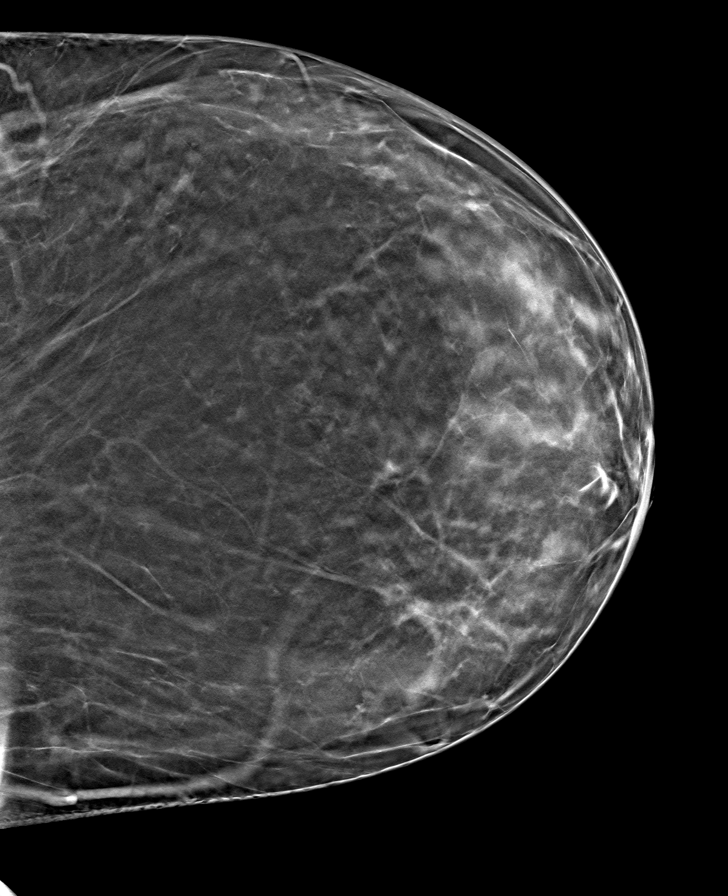

[8 of 24 positions shown; findings below may reference images not displayed]

ACR Breast Density Category b: There are scattered areas of
fibroglandular density.
FINDINGS: There are no findings suspicious for malignancy.
IMPRESSION: No mammographic evidence of malignancy. A result letter of this
screening mammogram will be mailed directly to the patient.

RECOMMENDATION:
Screening mammogram in one year. (Code:51-O-LD2)

BI-RADS CATEGORY  1: Negative.

## 2022-07-20 ENCOUNTER — Ambulatory Visit (INDEPENDENT_AMBULATORY_CARE_PROVIDER_SITE_OTHER): Payer: No Typology Code available for payment source | Admitting: Psychology

## 2022-07-20 DIAGNOSIS — F4310 Post-traumatic stress disorder, unspecified: Secondary | ICD-10-CM

## 2022-07-20 DIAGNOSIS — F423 Hoarding disorder: Secondary | ICD-10-CM

## 2022-07-20 NOTE — Progress Notes (Addendum)
[facility] Counselor/Therapist Progress Note  Patient ID: Kristen Brown, MRN: [id number],    Date: 07/20/2022  Time Spent: 60 minutes  Treatment Type: Individual Therapy  Reported Symptoms: sadness, frustration  Mental Status Exam: Appearance:  Casual     Behavior: Appropriate  Motor: Normal  Speech/Language:  Normal Rate  Affect: blunted  Mood: pleasant  Thought process: normal  Thought content:   WNL  Sensory/Perceptual disturbances:   WNL  Orientation: oriented to person, place, time/date, and situation  Attention: Good  Concentration: Good  Memory: WNL  Fund of knowledge:  Good  Insight:   Good  Judgment:  Good  Impulse Control: Good   Risk Assessment: Danger to Self:  No Self-injurious Behavior: No Danger to Others: No Duty to Warn:no Physical Aggression / Violence:No  Access to Firearms a concern: No  Gang Involvement:No   Subjective: The patient attended a face-to-face individual therapy session via video today.  The patient gave verbal consent for the video to be on WebEx.  The patient was alone in her car alone and the therapist was in the office.  The patient presents with a blunted affect and mood is pleasant.  The patient reports that things have been going okay.  The patient did seem a little distant today.  The patient states that things are going okay at work however she is having some difficulty with walking.  The patient reports that she is not sure what she is going to do about her job but we talked about different options for her.  We used problem solving and came up with a couple of alternatives for her in regard to what she can do.  We talked about long-term disability as well as her potentially moving in with her son and getting a work from her job.  The patient is maintaining and doing better than she had in the past but still struggles with worry sometimes.  I reminded her of how to manage her self talk.  Interventions: Cognitive  Behavioral Therapy, problem solving  Diagnosis:PTSD (post-traumatic stress disorder)  Hoarding disorder  Plan: Client Abilities/Strengths  Intelligent, ability for insight, motivated  Client Treatment Preferences  Outpatient individual therapy  Client Statement of Needs  "[name] feels that I need more help with my trauma than she can offer"  Treatment Level  Outpatient Individual therapy  Symptoms  Displays a significant decline in interest and engagement in activities.:  (Status:  improved). Displays significant psychological and/or physiological distress resulting from internal and  external clues that are reminiscent of the traumatic event.:(Status: maintained). Experiences disturbances in sleep.: (Status: improved). Experiences disturbing  and persistent thoughts, images, and/or perceptions of the traumatic event.:  (Status: improved). Experiences frequent nightmares.:  (Status: improved). Has  been exposed to a traumatic event involving actual or perceived threat of death or serious injury.: (Status: maintained). Impairment in social, occupational, or other areas of  functioning.:  (Status: improved). Intentionally avoids thoughts, feelings, or  discussions related to the traumatic event.: (Status: improved). Reports  response of intense fear, helplessness, or horror to the traumatic event.: (Status: improved). Symptoms present more than one month.:(Status: maintained).  Goals 1. Eliminate or reduce the negative impact trauma related symptoms have  on social, occupational, and family functioning. 2. No longer experiences intrusive event recollections, avoidance of event  reminders, intense arousal, or disinterest in activities or  relationships. Objective Verbalize any symptoms of depression, including any suicidal thoughts. Target Date: 2023-02-03 Frequency: Biweekly Progress: 70 Modality: individual Objective Participate  in Eye Movement Desensitization and Reprocessing  (EMDR) to reduce emotional distress  related to traumatic thoughts, feelings, and images. Target Date: 2023-02-03 Frequency: Biweekly Progress: 60 Modality: individual Related Interventions 1. Utilize Eye Movement Desensitization and Reprocessing (EMDR) to reduce the client's  emotional reactivity to the traumatic event and reduce PTSD symptoms. Objective Learn and implement calming skills. Target Date: 2023-02-03 Frequency: Biweekly Progress: 40 Modality: individual Related Interventions 1. Teach the client calming skills (e.g., breathing retraining, relaxation, calming self-talk) to use in and between sessions when feeling overly distressed. Objective Participate in Cognitive Therapy to help identify, challenge, and replace biased, negative, and selfdefeating thoughts resulting from the trauma. Target Date: 2023-02-03 Frequency: Biweekly Progress: 50 Modality: individual Related Interventions 1. Using Cognitive Therapy techniques, explore the client's self-talk and beliefs about self, others,  and the future that are a consequence of the trauma (e.g., themes of safety, trust, power, control,  esteem, and intimacy); identify and challenge biases; assist him/her in generating appraisals that correct for the biases; test biased and alternatives predictions through behavioral experiments. Objective Learn and implement guided self-dialogue to manage thoughts, feelings, and urges brought on by  encounters with trauma-related situations. Target Date: 2023-02-03 Frequency: Biweekly Progress: 60 Modality: individual Related Interventions 1. Teach the client a guided self-dialogue procedure in which he/she learns to recognize  maladaptive self-talk, challenges its biases, copes with engendered feelings, overcomes  avoidance, and reinforces his/her accomplishments; review and reinforce progress, problemsolve obstacles. Objective Learn and implement approaches for addressing shame and  self-disparagement. Target Date: 2023-02-03 Frequency: Biweekly Progress: 70 Modality: individual Diagnosis F43.10 (Posttraumatic stress disorder) - Open -signifier: n/a posttraumatic Stress  Disorder  Conditions For Discharge Achievement of treatment goals and objectives Patient is scheduled for her next session within the next two weeks. The patient approved this treatment plan.   Ellwyn Ergle G Sherby Moncayo, LCSW

## 2022-08-03 ENCOUNTER — Ambulatory Visit (INDEPENDENT_AMBULATORY_CARE_PROVIDER_SITE_OTHER): Payer: No Typology Code available for payment source | Admitting: Psychology

## 2022-08-03 DIAGNOSIS — F423 Hoarding disorder: Secondary | ICD-10-CM | POA: Diagnosis not present

## 2022-08-03 DIAGNOSIS — F4310 Post-traumatic stress disorder, unspecified: Secondary | ICD-10-CM

## 2022-08-03 NOTE — Progress Notes (Signed)
[facility] Counselor/Therapist Progress Note  Patient ID: Kristen Brown, MRN: [id number],    Date: 08/03/2022  Time Spent: 60 minutes  Treatment Type: Individual Therapy  Reported Symptoms: sadness, frustration  Mental Status Exam: Appearance:  Casual     Behavior: Appropriate  Motor: Normal  Speech/Language:  Normal Rate  Affect: blunted  Mood: pleasant  Thought process: normal  Thought content:   WNL  Sensory/Perceptual disturbances:   WNL  Orientation: oriented to person, place, time/date, and situation  Attention: Good  Concentration: Good  Memory: WNL  Fund of knowledge:  Good  Insight:   Good  Judgment:  Good  Impulse Control: Good   Risk Assessment: Danger to Self:  No Self-injurious Behavior: No Danger to Others: No Duty to Warn:no Physical Aggression / Violence:No  Access to Firearms a concern: No  Gang Involvement:No   Subjective: The patient attended a face-to-face individual therapy session via video today.  The patient gave verbal consent for the video to be on WebEx.  The patient was alone in her car alone and the therapist was in the office.  The patient presents with a blunted affect and mood is pleasant.  The patient seems a little down today.  We talked about what she was doing and she states that she got her water fixed in her house and feels okay about that.  The patient really does not like where she is living very much and it would probably be beneficial for her to try to find something else at some point.  We talked about how she is doing on her eating program and she has lost 11 pounds and she feels very happy about this.  We also discussed what she plans to do for the holidays and at this point she does not have any solidified plans but we did did some reframing of her thoughts and talked about how to make it a positive event even if she does not end up going to someone's home.  Interventions: Cognitive Behavioral Therapy, problem  solving  Diagnosis:PTSD (post-traumatic stress disorder)  Hoarding disorder  Plan: Client Abilities/Strengths  Intelligent, ability for insight, motivated  Client Treatment Preferences  Outpatient individual therapy  Client Statement of Needs  "[name] feels that I need more help with my trauma than she can offer"  Treatment Level  Outpatient Individual therapy  Symptoms  Displays a significant decline in interest and engagement in activities.:  (Status:  improved). Displays significant psychological and/or physiological distress resulting from internal and  external clues that are reminiscent of the traumatic event.:(Status: maintained). Experiences disturbances in sleep.: (Status: improved). Experiences disturbing  and persistent thoughts, images, and/or perceptions of the traumatic event.:  (Status: improved). Experiences frequent nightmares.:  (Status: improved). Has  been exposed to a traumatic event involving actual or perceived threat of death or serious injury.: (Status: maintained). Impairment in social, occupational, or other areas of  functioning.:  (Status: improved). Intentionally avoids thoughts, feelings, or  discussions related to the traumatic event.: (Status: improved). Reports  response of intense fear, helplessness, or horror to the traumatic event.: (Status: improved). Symptoms present more than one month.:(Status: maintained).  Goals 1. Eliminate or reduce the negative impact trauma related symptoms have  on social, occupational, and family functioning. 2. No longer experiences intrusive event recollections, avoidance of event  reminders, intense arousal, or disinterest in activities or  relationships. Objective Verbalize any symptoms of depression, including any suicidal thoughts. Target Date: 2023-02-03 Frequency: Biweekly Progress: 70 Modality: individual  Objective Participate in Eye Movement Desensitization and Reprocessing (EMDR) to reduce emotional  distress  related to traumatic thoughts, feelings, and images. Target Date: 2023-02-03 Frequency: Biweekly Progress: 60 Modality: individual Related Interventions 1. Utilize Eye Movement Desensitization and Reprocessing (EMDR) to reduce the client's  emotional reactivity to the traumatic event and reduce PTSD symptoms. Objective Learn and implement calming skills. Target Date: 2023-02-03 Frequency: Biweekly Progress: 40 Modality: individual Related Interventions 1. Teach the client calming skills (e.g., breathing retraining, relaxation, calming self-talk) to use in and between sessions when feeling overly distressed. Objective Participate in Cognitive Therapy to help identify, challenge, and replace biased, negative, and selfdefeating thoughts resulting from the trauma. Target Date: 2023-02-03 Frequency: Biweekly Progress: 50 Modality: individual Related Interventions 1. Using Cognitive Therapy techniques, explore the client's self-talk and beliefs about self, others,  and the future that are a consequence of the trauma (e.g., themes of safety, trust, power, control,  esteem, and intimacy); identify and challenge biases; assist him/her in generating appraisals that correct for the biases; test biased and alternatives predictions through behavioral experiments. Objective Learn and implement guided self-dialogue to manage thoughts, feelings, and urges brought on by  encounters with trauma-related situations. Target Date: 2023-02-03 Frequency: Biweekly Progress: 60 Modality: individual Related Interventions 1. Teach the client a guided self-dialogue procedure in which he/she learns to recognize  maladaptive self-talk, challenges its biases, copes with engendered feelings, overcomes  avoidance, and reinforces his/her accomplishments; review and reinforce progress, problemsolve obstacles. Objective Learn and implement approaches for addressing shame and self-disparagement. Target Date:  2023-02-03 Frequency: Biweekly Progress: 70 Modality: individual Diagnosis F43.10 (Posttraumatic stress disorder) - Open -signifier: n/a posttraumatic Stress  Disorder  Conditions For Discharge Achievement of treatment goals and objectives Patient is scheduled for her next session within the next two weeks. The patient approved this treatment plan.   Kimisha Eunice G Adron Geisel, LCSW

## 2022-08-17 ENCOUNTER — Ambulatory Visit (INDEPENDENT_AMBULATORY_CARE_PROVIDER_SITE_OTHER): Payer: No Typology Code available for payment source | Admitting: Psychology

## 2022-08-17 ENCOUNTER — Encounter: Payer: Self-pay | Admitting: Adult Health

## 2022-08-17 ENCOUNTER — Ambulatory Visit: Payer: No Typology Code available for payment source | Admitting: Adult Health

## 2022-08-17 DIAGNOSIS — F423 Hoarding disorder: Secondary | ICD-10-CM

## 2022-08-17 DIAGNOSIS — F4310 Post-traumatic stress disorder, unspecified: Secondary | ICD-10-CM

## 2022-08-17 NOTE — Progress Notes (Signed)
[facility] Counselor/Therapist Progress Note  Patient ID: Kristen Brown, MRN: [id number],    Date: 08/17/2022  Time Spent: 50 minutes  Treatment Type: Individual Therapy  Reported Symptoms: sadness, frustration  Mental Status Exam: Appearance:  Casual     Behavior: Appropriate  Motor: Normal  Speech/Language:  Normal Rate  Affect: blunted  Mood: pleasant  Thought process: normal  Thought content:   WNL  Sensory/Perceptual disturbances:   WNL  Orientation: oriented to person, place, time/date, and situation  Attention: Good  Concentration: Good  Memory: WNL  Fund of knowledge:  Good  Insight:   Good  Judgment:  Good  Impulse Control: Good   Risk Assessment: Danger to Self:  No Self-injurious Behavior: No Danger to Others: No Duty to Warn:no Physical Aggression / Violence:No  Access to Firearms a concern: No  Gang Involvement:No   Subjective: The patient attended a face-to-face individual therapy session via video today.  The patient gave verbal consent for the video to be on WebEx.  The patient was alone in her car alone and the therapist was in the office.  The patient presents with a blunted affect and mood is pleasant.  The patient reports that she had a good Thanksgiving however she was disappointed because her mother did not follow through on what she said she was going to do.  The patient's reports that her mother also did not wish her a happy birthday which was very frustrating for her.  We talked about having realistic expectations of her mother and being able to try to celebrate that she did not turn out in lots of ways like her mother did and knows better how to parent her son.  The patient seems to be managing things relatively well.  She states that she would like to get her sink cleaned off into her room before our next session and we made this goal.  Interventions: Cognitive Behavioral Therapy, problem solving  Diagnosis:PTSD  (post-traumatic stress disorder)  Hoarding disorder  Plan: Client Abilities/Strengths  Intelligent, ability for insight, motivated  Client Treatment Preferences  Outpatient individual therapy  Client Statement of Needs  "Kristen Brown feels that I need more help with my trauma than she can offer"  Treatment Level  Outpatient Individual therapy  Symptoms  Displays a significant decline in interest and engagement in activities.:  (Status:  improved). Displays significant psychological and/or physiological distress resulting from internal and  external clues that are reminiscent of the traumatic event.:(Status: maintained). Experiences disturbances in sleep.: (Status: improved). Experiences disturbing  and persistent thoughts, images, and/or perceptions of the traumatic event.:  (Status: improved). Experiences frequent nightmares.:  (Status: improved). Has  been exposed to a traumatic event involving actual or perceived threat of death or serious injury.: (Status: maintained). Impairment in social, occupational, or other areas of  functioning.:  (Status: improved). Intentionally avoids thoughts, feelings, or  discussions related to the traumatic event.: (Status: improved). Reports  response of intense fear, helplessness, or horror to the traumatic event.: (Status: improved). Symptoms present more than one month.:(Status: maintained).  Goals 1. Eliminate or reduce the negative impact trauma related symptoms have  on social, occupational, and family functioning. 2. No longer experiences intrusive event recollections, avoidance of event  reminders, intense arousal, or disinterest in activities or  relationships. Objective Verbalize any symptoms of depression, including any suicidal thoughts. Target Date: 2023-02-03 Frequency: Biweekly Progress: 70 Modality: individual Objective Participate in Eye Movement Desensitization and Reprocessing (EMDR) to reduce emotional distress  related to  traumatic thoughts, feelings, and images. Target Date: 2023-02-03 Frequency: Biweekly Progress: 60 Modality: individual Related Interventions 1. Utilize Eye Movement Desensitization and Reprocessing (EMDR) to reduce the client's  emotional reactivity to the traumatic event and reduce PTSD symptoms. Objective Learn and implement calming skills. Target Date: 2023-02-03 Frequency: Biweekly Progress: 40 Modality: individual Related Interventions 1. Teach the client calming skills (e.g., breathing retraining, relaxation, calming self-talk) to use in and between sessions when feeling overly distressed. Objective Participate in Cognitive Therapy to help identify, challenge, and replace biased, negative, and selfdefeating thoughts resulting from the trauma. Target Date: 2023-02-03 Frequency: Biweekly Progress: 50 Modality: individual Related Interventions 1. Using Cognitive Therapy techniques, explore the client's self-talk and beliefs about self, others,  and the future that are a consequence of the trauma (e.g., themes of safety, trust, power, control,  esteem, and intimacy); identify and challenge biases; assist him/her in generating appraisals that correct for the biases; test biased and alternatives predictions through behavioral experiments. Objective Learn and implement guided self-dialogue to manage thoughts, feelings, and urges brought on by  encounters with trauma-related situations. Target Date: 2023-02-03 Frequency: Biweekly Progress: 60 Modality: individual Related Interventions 1. Teach the client a guided self-dialogue procedure in which he/she learns to recognize  maladaptive self-talk, challenges its biases, copes with engendered feelings, overcomes  avoidance, and reinforces his/her accomplishments; review and reinforce progress, problemsolve obstacles. Objective Learn and implement approaches for addressing shame and self-disparagement. Target Date: 2023-02-03 Frequency:  Biweekly Progress: 70 Modality: individual Diagnosis F43.10 (Posttraumatic stress disorder) - Open -signifier: n/a posttraumatic Stress  Disorder  Conditions For Discharge Achievement of treatment goals and objectives Patient is scheduled for her next session within the next two weeks. The patient approved this treatment plan.   Kamy Poinsett G Yolette Hastings, LCSW

## 2022-08-31 ENCOUNTER — Ambulatory Visit (INDEPENDENT_AMBULATORY_CARE_PROVIDER_SITE_OTHER): Payer: No Typology Code available for payment source | Admitting: Psychology

## 2022-08-31 DIAGNOSIS — F4310 Post-traumatic stress disorder, unspecified: Secondary | ICD-10-CM | POA: Diagnosis not present

## 2022-08-31 DIAGNOSIS — F423 Hoarding disorder: Secondary | ICD-10-CM | POA: Diagnosis not present

## 2022-09-01 NOTE — Progress Notes (Signed)
[facility] Counselor/Therapist Progress Note  Patient ID: Kristen Brown, MRN: [id number],    Date: 08/31/2022  Time Spent: 60 minutes  Treatment Type: Individual Therapy  Reported Symptoms: sadness, frustration  Mental Status Exam: Appearance:  Casual     Behavior: Appropriate  Motor: Normal  Speech/Language:  Normal Rate  Affect: blunted  Mood: pleasant  Thought process: normal  Thought content:   WNL  Sensory/Perceptual disturbances:   WNL  Orientation: oriented to person, place, time/date, and situation  Attention: Good  Concentration: Good  Memory: WNL  Fund of knowledge:  Good  Insight:   Good  Judgment:  Good  Impulse Control: Good   Risk Assessment: Danger to Self:  No Self-injurious Behavior: No Danger to Others: No Duty to Warn:no Physical Aggression / Violence:No  Access to Firearms a concern: No  Gang Involvement:No   Subjective: The patient attended a face-to-face individual therapy session via video today.  The patient gave verbal consent for the video to be on WebEx.  The patient was alone in her car alone and the therapist was in the office.  The patient presents with a blunted affect and mood is pleasant.  The patient reports that she did get her sink cleaned off and she was proud of herself for that.  She states that she has been doing relatively well and feels like she does not have anything that is really bothering her at this moment.  She did talk about wanting to continue to get her house cleaned out so that she can feel better about that.  We did process some things that are going on with the relationship that she has.  She talked a bit about how proud she is of her son and I gave her positive feedback for being a good mother to him.  We talked about her continuing to work on cleaning out her house as the goal for her moving forward.   Interventions: Cognitive Behavioral Therapy, problem solving  Diagnosis:PTSD (post-traumatic  stress disorder)  Hoarding disorder  Plan: Client Abilities/Strengths  Intelligent, ability for insight, motivated  Client Treatment Preferences  Outpatient individual therapy  Client Statement of Needs  "[name] feels that I need more help with my trauma than she can offer"  Treatment Level  Outpatient Individual therapy  Symptoms  Displays a significant decline in interest and engagement in activities.:  (Status:  improved). Displays significant psychological and/or physiological distress resulting from internal and  external clues that are reminiscent of the traumatic event.:(Status: maintained). Experiences disturbances in sleep.: (Status: improved). Experiences disturbing  and persistent thoughts, images, and/or perceptions of the traumatic event.:  (Status: improved). Experiences frequent nightmares.:  (Status: improved). Has  been exposed to a traumatic event involving actual or perceived threat of death or serious injury.: (Status: maintained). Impairment in social, occupational, or other areas of  functioning.:  (Status: improved). Intentionally avoids thoughts, feelings, or  discussions related to the traumatic event.: (Status: improved). Reports  response of intense fear, helplessness, or horror to the traumatic event.: (Status: improved). Symptoms present more than one month.:(Status: maintained).  Goals 1. Eliminate or reduce the negative impact trauma related symptoms have  on social, occupational, and family functioning. 2. No longer experiences intrusive event recollections, avoidance of event  reminders, intense arousal, or disinterest in activities or  relationships. Objective Verbalize any symptoms of depression, including any suicidal thoughts. Target Date: 2023-02-03 Frequency: Biweekly Progress: 70 Modality: individual Objective Participate in Eye Movement Desensitization and Reprocessing (EMDR) to  reduce emotional distress  related to traumatic thoughts,  feelings, and images. Target Date: 2023-02-03 Frequency: Biweekly Progress: 60 Modality: individual Related Interventions 1. Utilize Eye Movement Desensitization and Reprocessing (EMDR) to reduce the client's  emotional reactivity to the traumatic event and reduce PTSD symptoms. Objective Learn and implement calming skills. Target Date: 2023-02-03 Frequency: Biweekly Progress: 40 Modality: individual Related Interventions 1. Teach the client calming skills (e.g., breathing retraining, relaxation, calming self-talk) to use in and between sessions when feeling overly distressed. Objective Participate in Cognitive Therapy to help identify, challenge, and replace biased, negative, and selfdefeating thoughts resulting from the trauma. Target Date: 2023-02-03 Frequency: Biweekly Progress: 50 Modality: individual Related Interventions 1. Using Cognitive Therapy techniques, explore the client's self-talk and beliefs about self, others,  and the future that are a consequence of the trauma (e.g., themes of safety, trust, power, control,  esteem, and intimacy); identify and challenge biases; assist him/her in generating appraisals that correct for the biases; test biased and alternatives predictions through behavioral experiments. Objective Learn and implement guided self-dialogue to manage thoughts, feelings, and urges brought on by  encounters with trauma-related situations. Target Date: 2023-02-03 Frequency: Biweekly Progress: 60 Modality: individual Related Interventions 1. Teach the client a guided self-dialogue procedure in which he/she learns to recognize  maladaptive self-talk, challenges its biases, copes with engendered feelings, overcomes  avoidance, and reinforces his/her accomplishments; review and reinforce progress, problemsolve obstacles. Objective Learn and implement approaches for addressing shame and self-disparagement. Target Date: 2023-02-03 Frequency: Biweekly Progress:  70 Modality: individual Diagnosis F43.10 (Posttraumatic stress disorder) - Open -signifier: n/a posttraumatic Stress  Disorder  Conditions For Discharge Achievement of treatment goals and objectives Patient is scheduled for her next session within the next two weeks. The patient approved this treatment plan.   Nandita Mathenia G Raeqwon Lux, LCSW

## 2022-09-14 ENCOUNTER — Ambulatory Visit (INDEPENDENT_AMBULATORY_CARE_PROVIDER_SITE_OTHER): Payer: No Typology Code available for payment source | Admitting: Psychology

## 2022-09-14 DIAGNOSIS — F423 Hoarding disorder: Secondary | ICD-10-CM

## 2022-09-14 DIAGNOSIS — F4310 Post-traumatic stress disorder, unspecified: Secondary | ICD-10-CM

## 2022-09-14 NOTE — Progress Notes (Signed)
[facility] Counselor/Therapist Progress Note  Patient ID: Kristen Brown, MRN: [id number],    Date: 09/14/2022  Time Spent: 60 minutes  Treatment Type: Individual Therapy  Reported Symptoms: sadness, frustration  Mental Status Exam: Appearance:  Casual     Behavior: Appropriate  Motor: Normal  Speech/Language:  Normal Rate  Affect: blunted  Mood: pleasant  Thought process: normal  Thought content:   WNL  Sensory/Perceptual disturbances:   WNL  Orientation: oriented to person, place, time/date, and situation  Attention: Good  Concentration: Good  Memory: WNL  Fund of knowledge:  Good  Insight:   Good  Judgment:  Good  Impulse Control: Good   Risk Assessment: Danger to Self:  No Self-injurious Behavior: No Danger to Others: No Duty to Warn:no Physical Aggression / Violence:No  Access to Firearms a concern: No  Gang Involvement:No   Subjective: The patient attended a face-to-face individual therapy session via video today.  The patient gave verbal consent for the video to be on WebEx.  The patient was alone in her car alone and the therapist was in the office.  The patient presents with a blunted affect and mood is pleasant.  The patient reports that she has had a good Christmas.  She talked about going to her mother's and her father's house.  The patient seems to be doing much better with being able to manage her emotions and not be as negative.  The patient states that she spent some time with her son and his girlfriend and that was good.  The patient is still having difficulty with hoarding to some degree and we talked about how to set small goals for herself so that she can get her house cleaned the way she wants to.  Between now and our next session she is supposed to wipe off the counter in the bathroom that she cleaned off and start to work on a bench area.  Interventions: Cognitive Behavioral Therapy, problem solving  Diagnosis:PTSD (post-traumatic  stress disorder)  Hoarding disorder  Plan: Client Abilities/Strengths  Intelligent, ability for insight, motivated  Client Treatment Preferences  Outpatient individual therapy  Client Statement of Needs  "[name] feels that I need more help with my trauma than she can offer"  Treatment Level  Outpatient Individual therapy  Symptoms  Displays a significant decline in interest and engagement in activities.:  (Status:  improved). Displays significant psychological and/or physiological distress resulting from internal and  external clues that are reminiscent of the traumatic event.:(Status: maintained). Experiences disturbances in sleep.: (Status: improved). Experiences disturbing  and persistent thoughts, images, and/or perceptions of the traumatic event.:  (Status: improved). Experiences frequent nightmares.:  (Status: improved). Has  been exposed to a traumatic event involving actual or perceived threat of death or serious injury.: (Status: maintained). Impairment in social, occupational, or other areas of  functioning.:  (Status: improved). Intentionally avoids thoughts, feelings, or  discussions related to the traumatic event.: (Status: improved). Reports  response of intense fear, helplessness, or horror to the traumatic event.: (Status: improved). Symptoms present more than one month.:(Status: maintained).  Goals 1. Eliminate or reduce the negative impact trauma related symptoms have  on social, occupational, and family functioning. 2. No longer experiences intrusive event recollections, avoidance of event  reminders, intense arousal, or disinterest in activities or  relationships. Objective Verbalize any symptoms of depression, including any suicidal thoughts. Target Date: 2023-02-03 Frequency: Biweekly Progress: 70 Modality: individual Objective Participate in Eye Movement Desensitization and Reprocessing (EMDR) to reduce emotional distress  related to traumatic thoughts,  feelings, and images. Target Date: 2023-02-03 Frequency: Biweekly Progress: 60 Modality: individual Related Interventions 1. Utilize Eye Movement Desensitization and Reprocessing (EMDR) to reduce the client's  emotional reactivity to the traumatic event and reduce PTSD symptoms. Objective Learn and implement calming skills. Target Date: 2023-02-03 Frequency: Biweekly Progress: 40 Modality: individual Related Interventions 1. Teach the client calming skills (e.g., breathing retraining, relaxation, calming self-talk) to use in and between sessions when feeling overly distressed. Objective Participate in Cognitive Therapy to help identify, challenge, and replace biased, negative, and selfdefeating thoughts resulting from the trauma. Target Date: 2023-02-03 Frequency: Biweekly Progress: 50 Modality: individual Related Interventions 1. Using Cognitive Therapy techniques, explore the client's self-talk and beliefs about self, others,  and the future that are a consequence of the trauma (e.g., themes of safety, trust, power, control,  esteem, and intimacy); identify and challenge biases; assist him/her in generating appraisals that correct for the biases; test biased and alternatives predictions through behavioral experiments. Objective Learn and implement guided self-dialogue to manage thoughts, feelings, and urges brought on by  encounters with trauma-related situations. Target Date: 2023-02-03 Frequency: Biweekly Progress: 60 Modality: individual Related Interventions 1. Teach the client a guided self-dialogue procedure in which he/she learns to recognize  maladaptive self-talk, challenges its biases, copes with engendered feelings, overcomes  avoidance, and reinforces his/her accomplishments; review and reinforce progress, problemsolve obstacles. Objective Learn and implement approaches for addressing shame and self-disparagement. Target Date: 2023-02-03 Frequency: Biweekly Progress:  70 Modality: individual Diagnosis F43.10 (Posttraumatic stress disorder) - Open -signifier: n/a posttraumatic Stress  Disorder  Conditions For Discharge Achievement of treatment goals and objectives Patient is scheduled for her next session within the next two weeks. The patient approved this treatment plan.   Clement Deneault G Damari Suastegui, LCSW

## 2022-09-15 ENCOUNTER — Ambulatory Visit: Payer: No Typology Code available for payment source | Admitting: Neurology

## 2022-09-28 ENCOUNTER — Ambulatory Visit: Payer: No Typology Code available for payment source | Admitting: Psychology

## 2022-09-28 DIAGNOSIS — F4310 Post-traumatic stress disorder, unspecified: Secondary | ICD-10-CM | POA: Diagnosis not present

## 2022-09-28 DIAGNOSIS — F423 Hoarding disorder: Secondary | ICD-10-CM | POA: Diagnosis not present

## 2022-09-28 NOTE — Progress Notes (Signed)
[facility] Counselor/Therapist Progress Note  Patient ID: Kristen Brown, MRN: [id number],    Date: 09/28/2022  Time Spent: 60 minutes  Treatment Type: Individual Therapy  Reported Symptoms: sadness, frustration  Mental Status Exam: Appearance:  Casual     Behavior: Appropriate  Motor: Normal  Speech/Language:  Normal Rate  Affect: blunted  Mood: pleasant  Thought process: normal  Thought content:   WNL  Sensory/Perceptual disturbances:   WNL  Orientation: oriented to person, place, time/date, and situation  Attention: Good  Concentration: Good  Memory: WNL  Fund of knowledge:  Good  Insight:   Good  Judgment:  Good  Impulse Control: Good   Risk Assessment: Danger to Self:  No Self-injurious Behavior: No Danger to Others: No Duty to Warn:no Physical Aggression / Violence:No  Access to Firearms a concern: No  Gang Involvement:No   Subjective: The patient attended a face-to-face individual therapy session via video today.  The patient gave verbal consent for the video to be on WebEx.  The patient was alone in her car alone and the therapist was in the office.  The patient presents with a blunted affect and mood is pleasant.  When the patient got on the session today she  stated that she was not exactly sure what she was going to talk about today.  We talked about the possibility of going to once a month however the patient's still struggling somewhat with hoarding.  We talked about the need to make some progress in that area because this seems to frustrate the patient.  We decided that we would go to once a month once the patient did make some headway and get her bedroom cleaned up.  We talked about it possibly being related to feeling not good enough and we talked about doing some EMDR during the next session to help her with this issue.  The patient has made good progress in therapy and does not seem to be focused as much on what her parents think about her  and she seems to be doing a better job of living her life.  I  Interventions: Cognitive Behavioral Therapy, problem solving  Diagnosis:PTSD (post-traumatic stress disorder)  Hoarding disorder  Plan: Client Abilities/Strengths  Intelligent, ability for insight, motivated  Client Treatment Preferences  Outpatient individual therapy  Client Statement of Needs  "Kristen Brown feels that I need more help with my trauma than she can offer"  Treatment Level  Outpatient Individual therapy  Symptoms  Displays a significant decline in interest and engagement in activities.:  (Status:  improved). Displays significant psychological and/or physiological distress resulting from internal and  external clues that are reminiscent of the traumatic event.:(Status: maintained). Experiences disturbances in sleep.: (Status: improved). Experiences disturbing  and persistent thoughts, images, and/or perceptions of the traumatic event.:  (Status: improved). Experiences frequent nightmares.:  (Status: improved). Has  been exposed to a traumatic event involving actual or perceived threat of death or serious injury.: (Status: maintained). Impairment in social, occupational, or other areas of  functioning.:  (Status: improved). Intentionally avoids thoughts, feelings, or  discussions related to the traumatic event.: (Status: improved). Reports  response of intense fear, helplessness, or horror to the traumatic event.: (Status: improved). Symptoms present more than one month.:(Status: maintained).  Goals 1. Eliminate or reduce the negative impact trauma related symptoms have  on social, occupational, and family functioning. 2. No longer experiences intrusive event recollections, avoidance of event  reminders, intense arousal, or disinterest in activities or  relationships. Objective  Verbalize any symptoms of depression, including any suicidal thoughts. Target Date: 2023-02-03 Frequency: Biweekly Progress: 70  Modality: individual Objective Participate in Eye Movement Desensitization and Reprocessing (EMDR) to reduce emotional distress  related to traumatic thoughts, feelings, and images. Target Date: 2023-02-03 Frequency: Biweekly Progress: 60 Modality: individual Related Interventions 1. Utilize Eye Movement Desensitization and Reprocessing (EMDR) to reduce the client's  emotional reactivity to the traumatic event and reduce PTSD symptoms. Objective Learn and implement calming skills. Target Date: 2023-02-03 Frequency: Biweekly Progress: 40 Modality: individual Related Interventions 1. Teach the client calming skills (e.g., breathing retraining, relaxation, calming self-talk) to use in and between sessions when feeling overly distressed. Objective Participate in Cognitive Therapy to help identify, challenge, and replace biased, negative, and selfdefeating thoughts resulting from the trauma. Target Date: 2023-02-03 Frequency: Biweekly Progress: 50 Modality: individual Related Interventions 1. Using Cognitive Therapy techniques, explore the client's self-talk and beliefs about self, others,  and the future that are a consequence of the trauma (e.g., themes of safety, trust, power, control,  esteem, and intimacy); identify and challenge biases; assist him/her in generating appraisals that correct for the biases; test biased and alternatives predictions through behavioral experiments. Objective Learn and implement guided self-dialogue to manage thoughts, feelings, and urges brought on by  encounters with trauma-related situations. Target Date: 2023-02-03 Frequency: Biweekly Progress: 60 Modality: individual Related Interventions 1. Teach the client a guided self-dialogue procedure in which he/she learns to recognize  maladaptive self-talk, challenges its biases, copes with engendered feelings, overcomes  avoidance, and reinforces his/her accomplishments; review and reinforce progress,  problemsolve obstacles. Objective Learn and implement approaches for addressing shame and self-disparagement. Target Date: 2023-02-03 Frequency: Biweekly Progress: 70 Modality: individual Diagnosis F43.10 (Posttraumatic stress disorder) - Open -signifier: n/a posttraumatic Stress  Disorder  Conditions For Discharge Achievement of treatment goals and objectives Patient is scheduled for her next session within the next two weeks. The patient approved this treatment plan.   Alasha Mcguinness G Lailah Marcelli, LCSW

## 2022-10-12 ENCOUNTER — Ambulatory Visit: Payer: No Typology Code available for payment source | Admitting: Psychology

## 2022-10-12 DIAGNOSIS — F423 Hoarding disorder: Secondary | ICD-10-CM | POA: Diagnosis not present

## 2022-10-12 DIAGNOSIS — F4310 Post-traumatic stress disorder, unspecified: Secondary | ICD-10-CM | POA: Diagnosis not present
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
## 2022-10-26 ENCOUNTER — Ambulatory Visit: Payer: No Typology Code available for payment source | Admitting: Psychology

## 2022-10-26 DIAGNOSIS — F423 Hoarding disorder: Secondary | ICD-10-CM

## 2022-10-26 DIAGNOSIS — F4310 Post-traumatic stress disorder, unspecified: Secondary | ICD-10-CM
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
## 2022-11-09 ENCOUNTER — Ambulatory Visit: Payer: No Typology Code available for payment source | Admitting: Psychology

## 2022-11-23 ENCOUNTER — Ambulatory Visit: Payer: No Typology Code available for payment source | Admitting: Psychology

## 2022-11-28 ENCOUNTER — Telehealth: Payer: Self-pay | Admitting: Neurology

## 2022-11-28 NOTE — Telephone Encounter (Signed)
Pt states she last used the CPAP last Thursday, she hates it and would like to discuss returning it.  Pt asking to be called on this request.

## 2022-11-28 NOTE — Telephone Encounter (Signed)
Called and spoke with pt. Relayed Dr. Garth Bigness message. She verbalized understanding. Advised she is working on losing weight.  She will call [facility] to see what she needs to do with machine.

## 2022-11-28 NOTE — Telephone Encounter (Signed)
Dr.Sater I called patient to discuss the below, she wishes to stop using cpap. States she is not getting more than 4 hours of sleep on machine, she has changed mask twice. She doesn't want a new machine, she wishes to just stop using it. Pt said she gets better sleep without machine.   Please advise

## 2022-11-29 ENCOUNTER — Other Ambulatory Visit: Payer: Self-pay | Admitting: *Deleted

## 2022-11-29 ENCOUNTER — Telehealth: Payer: Self-pay | Admitting: *Deleted

## 2022-11-29 ENCOUNTER — Other Ambulatory Visit: Payer: Self-pay | Admitting: Neurology

## 2022-11-29 DIAGNOSIS — Z79899 Other long term (current) drug therapy: Secondary | ICD-10-CM

## 2022-11-29 DIAGNOSIS — G35 Multiple sclerosis: Secondary | ICD-10-CM

## 2022-11-29 DIAGNOSIS — E559 Vitamin D deficiency, unspecified: Secondary | ICD-10-CM

## 2022-11-29 NOTE — Telephone Encounter (Signed)
Pt due for an office visit. Please call and offer 12/14/22 at 3pm with Dr. Felecia Shelling and let her know he wants to see her for an updated viist

## 2022-11-30 ENCOUNTER — Other Ambulatory Visit (INDEPENDENT_AMBULATORY_CARE_PROVIDER_SITE_OTHER): Payer: Self-pay

## 2022-11-30 DIAGNOSIS — E559 Vitamin D deficiency, unspecified: Secondary | ICD-10-CM

## 2022-11-30 DIAGNOSIS — G35 Multiple sclerosis: Secondary | ICD-10-CM

## 2022-11-30 DIAGNOSIS — Z79899 Other long term (current) drug therapy: Secondary | ICD-10-CM

## 2022-11-30 DIAGNOSIS — Z0289 Encounter for other administrative examinations: Secondary | ICD-10-CM

## 2022-12-02 LAB — CD20 B CELLS
% CD19-B Cells: 0 % — ABNORMAL LOW (ref 4.6–22.1)
% CD20-B Cells: 0 % — ABNORMAL LOW (ref 5.0–22.3)

## 2022-12-02 LAB — IGG, IGA, IGM
IgA/Immunoglobulin A, Serum: 157 mg/dL (ref 87–352)
IgG (Immunoglobin G), Serum: 679 mg/dL (ref 586–1602)
IgM (Immunoglobulin M), Srm: 85 mg/dL (ref 26–217)

## 2022-12-02 LAB — VITAMIN D 25 HYDROXY (VIT D DEFICIENCY, FRACTURES): Vit D, 25-Hydroxy: 35.4 ng/mL (ref 30.0–100.0)

## 2022-12-07 ENCOUNTER — Ambulatory Visit: Payer: No Typology Code available for payment source | Admitting: Psychology

## 2022-12-07 DIAGNOSIS — F4310 Post-traumatic stress disorder, unspecified: Secondary | ICD-10-CM | POA: Diagnosis not present

## 2022-12-07 DIAGNOSIS — F423 Hoarding disorder: Secondary | ICD-10-CM

## 2022-12-07 NOTE — Progress Notes (Addendum)
[facility] Counselor/Therapist Progress Note  Patient ID: Kristen Brown, MRN: [id number],    Date: 12/07/2022  Time Spent: 60 minutes  Treatment Type: Individual Therapy  Reported Symptoms: sadness, frustration  Mental Status Exam: Appearance:  Casual     Behavior: Appropriate  Motor: Normal  Speech/Language:  Normal Rate  Affect: blunted  Mood: pleasant  Thought process: normal  Thought content:   WNL  Sensory/Perceptual disturbances:   WNL  Orientation: oriented to person, place, time/date, and situation  Attention: Good  Concentration: Good  Memory: WNL  Fund of knowledge:  Good  Insight:   Good  Judgment:  Good  Impulse Control: Good   Risk Assessment: Danger to Self:  No Self-injurious Behavior: No Danger to Others: No Duty to Warn:no Physical Aggression / Violence:No  Access to Firearms a concern: No  Gang Involvement:No   Subjective: The patient attended a face-to-face individual therapy session via video today.  The patient gave verbal consent for the video to be on WebEx.  The patient was alone in her car alone and the therapist was in the office.  The patient presents with a blunted affect and mood is pleasant.  The patient reports that she is just getting over [place].  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  She states that she has not felt terrible since she has had it.  We talked about how she has been doing emotionally and it seems that she has been somewhat critical of herself.  We talked about her needing to work on being less critical.  In addition we talked about her hoarding behavior and the need to clean her house up.  I gave her some suggestions on what she could do to compartmentalize the situation and go 1 bag at a time.  Interventions: Cognitive Behavioral Therapy, problem solving  Diagnosis:PTSD (post-traumatic stress disorder)  Hoarding disorder  Plan: Client Abilities/Strengths  Intelligent, ability for insight, motivated   Client Treatment Preferences  Outpatient individual therapy  Client Statement of Needs  "[name] feels that I need more help with my trauma than she can offer"  Treatment Level  Outpatient Individual therapy  Symptoms  Displays a significant decline in interest and engagement in activities.:  (Status:  improved). Displays significant psychological and/or physiological distress resulting from internal and  external clues that are reminiscent of the traumatic event.:(Status: maintained). Experiences disturbances in sleep.: (Status: improved). Experiences disturbing  and persistent thoughts, images, and/or perceptions of the traumatic event.:  (Status: improved). Experiences frequent nightmares.:  (Status: improved). Has  been exposed to a traumatic event involving actual or perceived threat of death or serious injury.: (Status: maintained). Impairment in social, occupational, or other areas of  functioning.:  (Status: improved). Intentionally avoids thoughts, feelings, or  discussions related to the traumatic event.: (Status: improved). Reports  response of intense fear, helplessness, or horror to the traumatic event.: (Status: improved). Symptoms present more than one month.:(Status: maintained).  Goals 1. Eliminate or reduce the negative impact trauma related symptoms have  on social, occupational, and family functioning. 2. No longer experiences intrusive event recollections, avoidance of event  reminders, intense arousal, or disinterest in activities or  relationships. Objective Verbalize any symptoms of depression, including any suicidal thoughts. Target Date: 2023-02-03 Frequency: Biweekly Progress: 70 Modality: individual Objective Participate in Eye Movement Desensitization and Reprocessing (EMDR) to reduce emotional distress  related to traumatic thoughts, feelings, and images. Target Date: 2023-02-03 Frequency: Biweekly Progress: 60 Modality: individual Related  Interventions 1. [employer]  Desensitization and Reprocessing (EMDR) to reduce the client's  emotional reactivity to the traumatic event and reduce PTSD symptoms. Objective Learn and implement calming skills. Target Date: 2023-02-03 Frequency: Biweekly Progress: 40 Modality: individual Related Interventions 1. Teach the client calming skills (e.g., breathing retraining, relaxation, calming self-talk) to use in and between sessions when feeling overly distressed. Objective Participate in Cognitive Therapy to help identify, challenge, and replace biased, negative, and selfdefeating thoughts resulting from the trauma. Target Date: 2023-02-03 Frequency: Biweekly Progress: 50 Modality: individual Related Interventions 1. Using Cognitive Therapy techniques, explore the client's self-talk and beliefs about self, others,  and the future that are a consequence of the trauma (e.g., themes of safety, trust, power, control,  esteem, and intimacy); identify and challenge biases; assist him/her in generating appraisals that correct for the biases; test biased and alternatives predictions through behavioral experiments. Objective Learn and implement guided self-dialogue to manage thoughts, feelings, and urges brought on by  encounters with trauma-related situations. Target Date: 2023-02-03 Frequency: Biweekly Progress: 60 Modality: individual Related Interventions 1. Teach the client a guided self-dialogue procedure in which he/she learns to recognize  maladaptive self-talk, challenges its biases, copes with engendered feelings, overcomes  avoidance, and reinforces his/her accomplishments; review and reinforce progress, problemsolve obstacles. Objective Learn and implement approaches for addressing shame and self-disparagement. Target Date: 2023-02-03 Frequency: Biweekly Progress: 70 Modality: individual Diagnosis F43.10 (Posttraumatic stress disorder) - Open -signifier: n/a posttraumatic  Stress  Disorder  Conditions For Discharge Achievement of treatment goals and objectives Patient is scheduled for her next session within the next two weeks. The patient approved this treatment plan.   Shaneen Reeser G Ashir Kunz, LCSW

## 2022-12-14 ENCOUNTER — Ambulatory Visit (INDEPENDENT_AMBULATORY_CARE_PROVIDER_SITE_OTHER): Payer: No Typology Code available for payment source | Admitting: Neurology

## 2022-12-14 ENCOUNTER — Encounter: Payer: Self-pay | Admitting: Neurology

## 2022-12-14 VITALS — BP 130/76 | HR 72 | Ht 70.0 in | Wt 343.4 lb

## 2022-12-14 DIAGNOSIS — E559 Vitamin D deficiency, unspecified: Secondary | ICD-10-CM | POA: Diagnosis not present

## 2022-12-14 DIAGNOSIS — G35 Multiple sclerosis: Secondary | ICD-10-CM | POA: Diagnosis not present

## 2022-12-14 DIAGNOSIS — G4733 Obstructive sleep apnea (adult) (pediatric): Secondary | ICD-10-CM

## 2022-12-14 DIAGNOSIS — G4719 Other hypersomnia: Secondary | ICD-10-CM

## 2022-12-14 DIAGNOSIS — Z79899 Other long term (current) drug therapy: Secondary | ICD-10-CM

## 2022-12-14 DIAGNOSIS — R5383 Other fatigue: Secondary | ICD-10-CM

## 2022-12-14 DIAGNOSIS — R269 Unspecified abnormalities of gait and mobility: Secondary | ICD-10-CM

## 2022-12-14 MED ORDER — PHENTERMINE HCL 37.5 MG PO CAPS: 37.5000 mg | ORAL_CAPSULE | ORAL | 5 refills | Status: DC

## 2022-12-14 NOTE — Progress Notes (Signed)
[facility]  PATIENT: Kristen Brown DOB: 12-Apr-1976  REFERRING DOCTOR OR PCP:  Kathyrn Lass, MD SOURCE: patient, notes from Dr. Sabra Heck and Dr. George Hugh, imaging and lab reports, MRI images on PACS  _________________________________   HISTORICAL  CHIEF COMPLAINT:  Chief Complaint  Patient presents with   Follow-up    RM 15, alone. Last seen 03/07/22. MS DMT: Ocrevus. Doing well, no new sx.   Update 12/14/2022 She is doing well but notes continued fatigue.  She has no new neurologic symptom.     She switched from DMF to [place] in 2022 and is tolerating it well.    Her next dose is April 2024.  She had labs checked last week.  The CD19/CD20 count was 0 showing excellent longevity of the Ocrevus dose.  IgG and IgM were fine.   She denies exacerbations or new symptoms.   MRI may 2023showed no new MS lesions.  She has mildly reduced gait due to right foot drop.  Stairs are difficult.   She has some hand and leg numbness (right) at times  ut it is mild.     She has urinary urgency with rare incontinence.   Has also had rare bowel incontinence.   Vision is ok - mild blurry but better than last year.    She has fatigue that is worsening.    She has some verbal fluency issues but nothing newg.    She sometimes notes reduced focus and attnetion. She has some anxiety.   She sees a Social worker.    Migraines are doing well.  She has mild OSA (AHI = 11.2) and tried CPAP x 6 months vut stopped as EDS did not improve.   She still was waking up a lot at night.   She takes gabapentin .  She stopped trazodone - it did not help her stay asleep     SHe has ESS of 20/24.   Does not have sleep paralysis or cataplexy.  Dreams can be vivid.     She is not sure if armodafinil helped her any..   We discussed that mild OSA often responds to weight loss.  EPWORTH SLEEPINESS SCALE  On a scale of 0 - 3 what is the chance of dozing:  Sitting and Reading:   2 Watching TV:    3 Sitting  inactive in a public place: 3 Passenger in car for one hour: 3 Lying down to rest in the afternoon: 3 Sitting and talking to someone: 1 Sitting quietly after lunch:  3 In a car, stopped in traffic:  2  Total (out of 24):    20/24    severe daytime sleepiness      MS HISTORY: She was diagnosed with MS in November 2014.    In early 2014,, she had numbness in her legs that was ust present one morning.  It improved over a few weeks and she did not see any doctors.    Later that year, she had diplopia that was preent one morning.   Symptoms took 6 months to mostly recover.   She feels worse if very tired or hot.   She had an MRi of the brain showing multiple lesions c/with MS.   3 lesions enhanced.    She was referred to Dr George Hugh at [facility].   She started Tecfidera.    She needed to titrate slow;y due to GI upset when she tried a higher dose more rapidly.    She no  longer has GI issues but has occasional flushing still that responds to aspirin.     Since starting the medication she did not had definite exacerbation until possibly earlier this year.   She had an exacerbation 01/2021 and MRI showed multipke enhancing lesions.   She was placed on Ocrevus in mid 2022.Marland Kitchen      MRI of the brain 05/11/2018 showed 1 or 2 more foci that were not present compared to the 2014 MRI including one in the middle cerebellar peduncle.  MRI of the cervical spine shows 1 probable MS focus.  MRI [place] 03/06/21 showed multiple T2/FLAIR hyperintense foci in the cerebral hemispheres, thalamus, brainstem and left middle cerebellar peduncle in a pattern consistent with demyelinating plaque associated with multiple sclerosis.  5 foci in the cerebral hemispheres enhance after gadolinium contrast consistent with acute demyelinating plaque.  Several nonenhancing foci in the hemispheres, 2 foci in the thalamus and 1 in the midbrain are present on the current MRI but were not present on the 2019 MRI.  MRI cervical spine 03/06/2021 showed  multiple T2 hyperintense foci within the spinal cord in a pattern consistent with demyelination associated with multiple sclerosis.  All but one of the foci are new compared to the 2019 MRI..  3 foci at C4-C5 and C7 enhance after contrast.  This is consistent with acute demyelinating plaques.  MRI of the brain 02/09/2022 showed T2/FLAIR hyperintense foci in the cerebral hemispheres, thalamus, brainstem and left middle cerebellar peduncle in a pattern consistent with chronic demyelinating plaque associated with multiple sclerosis.  None of the foci appear to be acute.  They do not enhance.  Compared to the MRI from 03/06/2021, there were no new lesions.   REVIEW OF SYSTEMS: Constitutional: No fevers, chills, sweats, or change in appetite.   She has fatigue. Eyes: No visual changes, double vision, eye pain Ear, nose and throat: No hearing loss, ear pain, nasal congestion, sore throat Cardiovascular: No chest pain, palpitations Respiratory:  No shortness of breath at rest or with exertion.   Mild OSA GastrointestinaI: No nausea, vomiting, diarrhea, abdominal pain, fecal incontinence Genitourinary:  No dysuria, urinary retention or frequency.  No nocturia. Musculoskeletal:  No neck pain, back pain Integumentary: No rash, pruritus, skin lesions Neurological: as above Psychiatric: No depression at this time.  No anxiety Endocrine: No palpitations, diaphoresis, change in appetite, change in weigh or increased thirst Hematologic/Lymphatic:  No anemia, purpura, petechiae. Allergic/Immunologic: No itchy/runny eyes, nasal congestion, recent allergic reactions, rashes  ALLERGIES: Allergies  Allergen Reactions   Sulfa Antibiotics Hives   Adhesive [Tape] Rash   Latex Rash    HOME MEDICATIONS:  Current Outpatient Medications:    ARIPiprazole (ABILIFY) 5 MG tablet, Take 5 mg by mouth daily., Disp: , Rfl:    Armodafinil 250 MG tablet, Take 1 tablet (250 mg total) by mouth daily., Disp: 30 tablet, Rfl:  5   Cholecalciferol (VITAMIN D3) 125 MCG (5000 UT) TABS, Take 1 tablet by mouth daily., Disp: , Rfl:    diclofenac (VOLTAREN) 75 MG EC tablet, Take 1 tablet (75 mg total) by mouth 2 (two) times daily., Disp: 30 tablet, Rfl: 2   DULoxetine (CYMBALTA) 60 MG capsule, Take 60 mg by mouth daily., Disp: , Rfl:    escitalopram (LEXAPRO) 20 MG tablet, TAKE 1 TABLET BY MOUTH EVERY DAY, Disp: 90 tablet, Rfl: 0   gabapentin (NEURONTIN) 300 MG capsule, TAKE 1 CAPSULE BY MOUTH THREE TIMES A DAY, Disp: 90 capsule, Rfl: 11   ibuprofen (ADVIL,MOTRIN)  800 MG tablet, Take 1 tablet (800 mg total) by mouth 3 (three) times daily., Disp: 21 tablet, Rfl: 0   loratadine (CLARITIN) 10 MG tablet, Take 10 mg by mouth daily., Disp: , Rfl:    LORazepam (ATIVAN) 0.5 MG tablet, Take 0.5 mg by mouth every 8 (eight) hours., Disp: , Rfl:    magnesium gluconate (MAGONATE) 500 MG tablet, Take 250 mg by mouth daily., Disp: , Rfl:    norgestimate-ethinyl estradiol (ORTHO-CYCLEN) 0.25-35 MG-MCG tablet, Take 1 tablet by mouth daily., Disp: , Rfl:    ocrelizumab (OCREVUS) 300 MG/10ML injection, Inject 600 mg into the vein every 6 (six) months., Disp: , Rfl:    phentermine 37.5 MG capsule, Take 1 capsule (37.5 mg total) by mouth every morning., Disp: 30 capsule, Rfl: 5   SUMAtriptan (IMITREX) 50 MG tablet, TAKE 1 TAB(S) ORALLY ONCE, CAN REPEAT 2 HOURS LATER IF NEEDED, Disp: , Rfl: 0  PAST MEDICAL HISTORY: Past Medical History:  Diagnosis Date   Anxiety    Depression    Gallstones 07/2014   GERD (gastroesophageal reflux disease)    no current med.   Irritable bowel syndrome (IBS)    Migraines    Multiple sclerosis ([place])    Sinus infection 08/11/2014   will be finished with antibiotic prior to surgery   Vision abnormalities     PAST SURGICAL HISTORY: Past Surgical History:  Procedure Laterality Date   CHOLECYSTECTOMY N/A 08/19/2014   Procedure: LAPAROSCOPIC CHOLECYSTECTOMY WITH INTRAOPERATIVE CHOLANGIOGRAM;  Surgeon: Erroll Luna, MD;  Location: [facility];  Service: General;  Laterality: N/A;   NO PAST SURGERIES      FAMILY HISTORY: Family History  Problem Relation Age of Onset   Hyperlipidemia Mother    Hyperlipidemia Father    Diabetes Father    Atrial fibrillation Father    Cancer Paternal Grandfather        colon   Healthy Brother    Lung cancer Maternal Grandmother    Diabetes Paternal Grandmother     SOCIAL HISTORY:  Social History   Socioeconomic History   Marital status: Single    Spouse name: Not on file   Number of children: Not on file   Years of education: Not on file   Highest education level: Not on file  Occupational History   Not on file  Tobacco Use   Smoking status: Never   Smokeless tobacco: Never  Substance and Sexual Activity   Alcohol use: No   Drug use: No   Sexual activity: Not on file  Other Topics Concern   Not on file  Social History Narrative   Not on file   Social Determinants of Health   Financial Resource Strain: Not on file  Food Insecurity: Not on file  Transportation Needs: Not on file  Physical Activity: Not on file  Stress: Not on file  Social Connections: Not on file  Intimate Partner Violence: Not on file     PHYSICAL EXAM  Vitals:   12/14/22 1440  BP: 130/76  Pulse: 72  Weight: (!) 343 lb 6.4 oz (155.8 kg)  Height: 5\' 10"  (1.778 m)    Body mass index is 49.27 kg/m.   General: The patient is well-developed and well-nourished and in no acute distress  Neck: The neck is supple with good ROM  Skin: Extremities are without significant edema.  Neurologic Exam  Mental status: The patient is alert and oriented x 3 at the time of the examination. The patient has  apparent normal recent and remote memory, with an apparently normal attention span and concentration ability.   Speech is normal.  Cranial nerves: Extraocular movements are full.  Facial strength and sensation was normal.  No dysarthria.Marland Kitchen  Hearing seems  symmetric.  Motor:  Muscle bulk is normal.   Tone is normal. Strength is  5 / 5 in all 4 extremities.   Sensory: Sensory testing shows reduced vibration and temperature sensation in hte right arm and leg.   Touch was more sytmmetric.    Coordination: Cerebellar testing reveals good finger-nose-finger and reduced right heel-to-shin   Gait and station: Station is normal.   The gait was fairly normal.  Tandem gait was wide.. Romberg is negative.   Reflexes: Deep tendon reflexes are symmetric and normal bilaterally.       DIAGNOSTIC DATA (LABS, IMAGING, TESTING) - I reviewed patient records, labs, notes, testing and imaging myself where available.  Lab Results  Component Value Date   WBC 12.0 (H) 05/20/2022   HGB 12.5 05/20/2022   HCT 39.3 05/20/2022   MCV 86.2 05/20/2022   PLT 218 05/20/2022      Component Value Date/Time   NA 138 05/20/2022 1238   NA 139 03/09/2021 1552   K 3.7 05/20/2022 1238   CL 105 05/20/2022 1238   CO2 24 05/20/2022 1238   GLUCOSE 131 (H) 05/20/2022 1238   BUN 12 05/20/2022 1238   BUN 10 03/09/2021 1552   CREATININE 0.68 05/20/2022 1238   CALCIUM 8.7 (L) 05/20/2022 1238   PROT 6.9 03/09/2021 1552   ALBUMIN 4.4 03/09/2021 1552   AST 12 03/09/2021 1552   ALT 14 03/09/2021 1552   ALKPHOS 85 03/09/2021 1552   BILITOT 0.3 03/09/2021 1552   GFRNONAA >60 05/20/2022 1238   GFRAA >90 08/12/2014 1220      ASSESSMENT AND PLAN  Multiple sclerosis (HCC)  OSA (obstructive sleep apnea)  High risk medication use  Vitamin D deficiency  Excessive daytime sleepiness  Other fatigue  Gait disturbance  1.  She will continue Ocrevus.  CD20 counts are 0 at 5 1/2 months.   IgG and IgM are normal.  We will check an MRI of the brain sometime in 2025.  Her last MRI showed no new lesions. 2.  stay active and exercise as tolerated.  3.  Phentermine for fatigue and weight loss 4. Return in 6 months or sooner if there are new or worsening neurologic  symptoms.  Allard Lightsey A. Felecia Shelling, MD, [facility] [id number], [id number] PM Certified in Neurology, Clinical Neurophysiology, Sleep Medicine, Pain Medicine and Neuroimaging  [facility] [address] [place], [place] [address] [phone]

## 2022-12-21 ENCOUNTER — Ambulatory Visit (INDEPENDENT_AMBULATORY_CARE_PROVIDER_SITE_OTHER): Payer: No Typology Code available for payment source | Admitting: Psychology

## 2022-12-21 DIAGNOSIS — F423 Hoarding disorder: Secondary | ICD-10-CM | POA: Diagnosis not present

## 2022-12-21 DIAGNOSIS — F4310 Post-traumatic stress disorder, unspecified: Secondary | ICD-10-CM | POA: Diagnosis not present

## 2022-12-22 NOTE — Progress Notes (Signed)
[place] Counselor/Therapist Progress Note  Patient ID: Kristen Brown, MRN: [id number],    Date: 12/21/2022  Time Spent: 60 minutes  Treatment Type: Individual Therapy  Reported Symptoms: sadness, frustration  Mental Status Exam: Appearance:  Casual     Behavior: Appropriate  Motor: Normal  Speech/Language:  Normal Rate  Affect: blunted  Mood: pleasant  Thought process: normal  Thought content:   WNL  Sensory/Perceptual disturbances:   WNL  Orientation: oriented to person, place, time/date, and situation  Attention: Good  Concentration: Good  Memory: WNL  Fund of knowledge:  Good  Insight:   Good  Judgment:  Good  Impulse Control: Good   Risk Assessment: Danger to Self:  No Self-injurious Behavior: No Danger to Others: No Duty to Warn:no Physical Aggression / Violence:No  Access to Firearms a concern: No  Gang Involvement:No   Subjective: The patient attended a face-to-face individual therapy session via video today.  The patient gave verbal consent for the video to be on WebEx.  The patient was alone in her car alone and the therapist was in the office.  The patient presents with a blunted affect and mood is pleasant.  The patient reports that things have been going okay in the last few weeks.  She states that she is a little anxious about having to go back to work full-time after her FMLA runs out.  We talked about her possibly moving in with her son so that she does not have as much angst about paying her bills.  She also might be able to find another job where she could work from home if she had the access to the Internet that she needed.  The patient seems to be doing okay.  She continues to struggle with hoarding behavior and trying to clean up her house.  She is not talking about doing any trauma work at this point in time and we will we will talk a little more about that moving forward.  She may not be ready to address anything else just yet but  we will have that conversation at her next session.  Interventions: Cognitive Behavioral Therapy, problem solving  Diagnosis:PTSD (post-traumatic stress disorder)  Hoarding disorder  Plan: Client Abilities/Strengths  Intelligent, ability for insight, motivated  Client Treatment Preferences  Outpatient individual therapy  Client Statement of Needs  "Kristen Brown feels that I need more help with my trauma than she can offer"  Treatment Level  Outpatient Individual therapy  Symptoms  Displays a significant decline in interest and engagement in activities.:  (Status:  improved). Displays significant psychological and/or physiological distress resulting from internal and  external clues that are reminiscent of the traumatic event.:(Status: maintained). Experiences disturbances in sleep.: (Status: improved). Experiences disturbing  and persistent thoughts, images, and/or perceptions of the traumatic event.:  (Status: improved). Experiences frequent nightmares.:  (Status: improved). Has  been exposed to a traumatic event involving actual or perceived threat of death or serious injury.: (Status: maintained). Impairment in social, occupational, or other areas of  functioning.:  (Status: improved). Intentionally avoids thoughts, feelings, or  discussions related to the traumatic event.: (Status: improved). Reports  response of intense fear, helplessness, or horror to the traumatic event.: (Status: improved). Symptoms present more than one month.:(Status: maintained).  Goals 1. Eliminate or reduce the negative impact trauma related symptoms have  on social, occupational, and family functioning. 2. No longer experiences intrusive event recollections, avoidance of event  reminders, intense arousal, or disinterest in activities or  relationships. Objective Verbalize any symptoms of depression, including any suicidal thoughts. Target Date: 2024-02-03 Frequency: Biweekly Progress: 70 Modality:  individual Objective Participate in Eye Movement Desensitization and Reprocessing (EMDR) to reduce emotional distress  related to traumatic thoughts, feelings, and images. Target Date: 2024-02-03 Frequency: Biweekly Progress: 60 Modality: individual Related Interventions 1. Utilize Eye Movement Desensitization and Reprocessing (EMDR) to reduce the client's  emotional reactivity to the traumatic event and reduce PTSD symptoms. Objective Learn and implement calming skills. Target Date: 2024-02-03 Frequency: Biweekly Progress: 40 Modality: individual Related Interventions 1. Teach the client calming skills (e.g., breathing retraining, relaxation, calming self-talk) to use in and between sessions when feeling overly distressed. Objective Participate in Cognitive Therapy to help identify, challenge, and replace biased, negative, and selfdefeating thoughts resulting from the trauma. Target Date: 2023-02-03 Frequency: Biweekly Progress: 50 Modality: individual Related Interventions 1. Using Cognitive Therapy techniques, explore the client's self-talk and beliefs about self, others,  and the future that are a consequence of the trauma (e.g., themes of safety, trust, power, control,  esteem, and intimacy); identify and challenge biases; assist him/her in generating appraisals that correct for the biases; test biased and alternatives predictions through behavioral experiments. Objective Learn and implement guided self-dialogue to manage thoughts, feelings, and urges brought on by  encounters with trauma-related situations. Target Date: 2024-02-03 Frequency: Biweekly Progress: 60 Modality: individual Related Interventions 1. Teach the client a guided self-dialogue procedure in which he/she learns to recognize  maladaptive self-talk, challenges its biases, copes with engendered feelings, overcomes  avoidance, and reinforces his/her accomplishments; review and reinforce progress, problemsolve  obstacles. Objective Learn and implement approaches for addressing shame and self-disparagement. Target Date: 2024-02-03 Frequency: Biweekly Progress: 70 Modality: individual Diagnosis F43.10 (Posttraumatic stress disorder) - Open -signifier: n/a posttraumatic Stress  Disorder  Conditions For Discharge Achievement of treatment goals and objectives Patient is scheduled for her next session within the next two weeks. The patient approved this treatment plan.   Ambriel Gorelick G Bela Nyborg, LCSW

## 2023-01-04 ENCOUNTER — Ambulatory Visit: Payer: No Typology Code available for payment source | Admitting: Psychology

## 2023-01-18 ENCOUNTER — Ambulatory Visit: Payer: No Typology Code available for payment source | Admitting: Psychology

## 2023-02-01 ENCOUNTER — Ambulatory Visit: Payer: No Typology Code available for payment source | Admitting: Psychology

## 2023-02-14 ENCOUNTER — Ambulatory Visit (INDEPENDENT_AMBULATORY_CARE_PROVIDER_SITE_OTHER): Payer: No Typology Code available for payment source | Admitting: Psychology

## 2023-02-14 DIAGNOSIS — F423 Hoarding disorder: Secondary | ICD-10-CM | POA: Diagnosis not present

## 2023-02-14 DIAGNOSIS — F4310 Post-traumatic stress disorder, unspecified: Secondary | ICD-10-CM | POA: Diagnosis not present

## 2023-02-14 NOTE — Progress Notes (Signed)
[facility] Counselor/Therapist Progress Note  Patient ID: Kristen Brown, MRN: [id number],    Date: 02/14/2023  Time Spent: 60 minutes  Treatment Type: Individual Therapy  Reported Symptoms: sadness, frustration  Mental Status Exam: Appearance:  Casual     Behavior: Appropriate  Motor: Normal  Speech/Language:  Normal Rate  Affect: blunted  Mood: pleasant  Thought process: normal  Thought content:   WNL  Sensory/Perceptual disturbances:   WNL  Orientation: oriented to person, place, time/date, and situation  Attention: Good  Concentration: Good  Memory: WNL  Fund of knowledge:  Good  Insight:   Good  Judgment:  Good  Impulse Control: Good   Risk Assessment: Danger to Self:  No Self-injurious Behavior: No Danger to Others: No Duty to Warn:no Physical Aggression / Violence:No  Access to Firearms a concern: No  Gang Involvement:No   Subjective: The patient attended a face-to-face individual therapy session via video today.  The patient gave verbal consent for the video to be on Caregility  The patient was alone in her car alone and the therapist was in the office.  The patient presents with a blunted affect and mood is pleasant.  The patient did report that her FMLA ran out.  She reports that this is causing her stress because she does not have a job that she feels like she can do physically.  She reports that she is starting to look for something to do.  I encouraged her to continue to do this while she has a job.  She at present really does not have the physical stamina because of her MS to do a job that requires her to be on her feet all day.  We talked about different jobs that she might be able to look at.  I also recommended that she contact the new [employer] to see if there was an office job that she might qualify for since [place].  We also talked about 2 relationships that she is dealing with right now. Interventions:  Cognitive Behavioral Therapy, problem solving  Diagnosis:PTSD (post-traumatic stress disorder)  Hoarding disorder  Plan: Client Abilities/Strengths  Intelligent, ability for insight, motivated  Client Treatment Preferences  Outpatient individual therapy  Client Statement of Needs  "[name] feels that I need more help with my trauma than she can offer"  Treatment Level  Outpatient Individual therapy  Symptoms  Displays a significant decline in interest and engagement in activities.:  (Status:  improved). Displays significant psychological and/or physiological distress resulting from internal and  external clues that are reminiscent of the traumatic event.:(Status: maintained). Experiences disturbances in sleep.: (Status: improved). Experiences disturbing  and persistent thoughts, images, and/or perceptions of the traumatic event.:  (Status: improved). Experiences frequent nightmares.:  (Status: improved). Has  been exposed to a traumatic event involving actual or perceived threat of death or serious injury.: (Status: maintained). Impairment in social, occupational, or other areas of  functioning.:  (Status: improved). Intentionally avoids thoughts, feelings, or  discussions related to the traumatic event.: (Status: improved). Reports  response of intense fear, helplessness, or horror to the traumatic event.: (Status: improved). Symptoms present more than one month.:(Status: maintained).  Goals 1. Eliminate or reduce the negative impact trauma related symptoms have  on social, occupational, and family functioning. 2. No longer experiences intrusive event recollections, avoidance of event  reminders, intense arousal, or disinterest in activities or  relationships. Objective Verbalize any symptoms of depression, including any suicidal thoughts. Target Date:  2024-02-03 Frequency: Biweekly Progress: 70 Modality: individual Objective Participate in Eye Movement Desensitization and  Reprocessing (EMDR) to reduce emotional distress  related to traumatic thoughts, feelings, and images. Target Date: 2024-02-03 Frequency: Biweekly Progress: 60 Modality: individual Related Interventions 1. Utilize Eye Movement Desensitization and Reprocessing (EMDR) to reduce the client's  emotional reactivity to the traumatic event and reduce PTSD symptoms. Objective Learn and implement calming skills. Target Date: 2024-02-03 Frequency: Biweekly Progress: 40 Modality: individual Related Interventions 1. Teach the client calming skills (e.g., breathing retraining, relaxation, calming self-talk) to use in and between sessions when feeling overly distressed. Objective Participate in Cognitive Therapy to help identify, challenge, and replace biased, negative, and selfdefeating thoughts resulting from the trauma. Target Date: 2024-02-03 Frequency: Biweekly Progress: 50 Modality: individual Related Interventions 1. Using Cognitive Therapy techniques, explore the client's self-talk and beliefs about self, others,  and the future that are a consequence of the trauma (e.g., themes of safety, trust, power, control,  esteem, and intimacy); identify and challenge biases; assist him/her in generating appraisals that correct for the biases; test biased and alternatives predictions through behavioral experiments. Objective Learn and implement guided self-dialogue to manage thoughts, feelings, and urges brought on by  encounters with trauma-related situations. Target Date: 2024-02-03 Frequency: Biweekly Progress: 60 Modality: individual Related Interventions 1. Teach the client a guided self-dialogue procedure in which he/she learns to recognize  maladaptive self-talk, challenges its biases, copes with engendered feelings, overcomes  avoidance, and reinforces his/her accomplishments; review and reinforce progress, problemsolve obstacles. Objective Learn and implement approaches for addressing shame  and self-disparagement. Target Date: 2024-02-03 Frequency: Biweekly Progress: 70 Modality: individual Diagnosis F43.10 (Posttraumatic stress disorder) - Open -signifier: n/a posttraumatic Stress  Disorder  Conditions For Discharge Achievement of treatment goals and objectives Patient is scheduled for her next session within the next two weeks. The patient approved this treatment plan.   Brock Mokry G Olof Marcil, LCSW

## 2023-02-15 ENCOUNTER — Ambulatory Visit: Payer: No Typology Code available for payment source | Admitting: Psychology

## 2023-02-20 ENCOUNTER — Encounter: Payer: Self-pay | Admitting: Neurology

## 2023-03-01 ENCOUNTER — Ambulatory Visit: Payer: No Typology Code available for payment source | Admitting: Psychology

## 2023-03-01 DIAGNOSIS — F4310 Post-traumatic stress disorder, unspecified: Secondary | ICD-10-CM | POA: Diagnosis not present

## 2023-03-01 DIAGNOSIS — F423 Hoarding disorder: Secondary | ICD-10-CM | POA: Diagnosis not present

## 2023-03-01 NOTE — Progress Notes (Signed)
[facility] Counselor/Therapist Progress Note  Patient ID: Kristen Brown, MRN: [id number],    Date: 03/01/2023  Time Spent: 60 minutes  Treatment Type: Individual Therapy  Reported Symptoms: sadness, frustration  Mental Status Exam: Appearance:  Casual     Behavior: Appropriate  Motor: Normal  Speech/Language:  Normal Rate  Affect: blunted  Mood: pleasant  Thought process: normal  Thought content:   WNL  Sensory/Perceptual disturbances:   WNL  Orientation: oriented to person, place, time/date, and situation  Attention: Good  Concentration: Good  Memory: WNL  Fund of knowledge:  Good  Insight:   Good  Judgment:  Good  Impulse Control: Good   Risk Assessment: Danger to Self:  No Self-injurious Behavior: No Danger to Others: No Duty to Warn:no Physical Aggression / Violence:No  Access to Firearms a concern: No  Gang Involvement:No   Subjective: The patient attended a face-to-face individual therapy session via video today.  The patient gave verbal consent for the video to be on Caregility and is aware of the limitations of telehealth. The patient was alone in her car alone and the therapist was in the office.  The patient presents with a blunted affect and mood is pleasant.  The patient reports that she thinks she is going to have to go on disability because she is not able to really do her full-time job anymore.  She states that her MS doctor has said that he will write her out on short-term disability.  We did some problem solving during this session and talked about how to move forward so she can have enough money to live on.  We came up with some ideas about her moving in with her son when he gets an apartment and also talked about her possibly going ahead and applying for long-term disability and SSI disability.  The patient appears to be very stressed about this because she is stressed a great deal about many most of the time.  We will continue to provide  supportive therapy and work with her on coming up with a good plan for how to move forward.  Interventions: Cognitive Behavioral Therapy, problem solving  Diagnosis:PTSD (post-traumatic stress disorder)  Hoarding disorder  Plan: Client Abilities/Strengths  Intelligent, ability for insight, motivated  Client Treatment Preferences  Outpatient individual therapy  Client Statement of Needs  "[name] feels that I need more help with my trauma than she can offer"  Treatment Level  Outpatient Individual therapy  Symptoms  Displays a significant decline in interest and engagement in activities.:  (Status:  improved). Displays significant psychological and/or physiological distress resulting from internal and  external clues that are reminiscent of the traumatic event.:(Status: maintained). Experiences disturbances in sleep.: (Status: improved). Experiences disturbing  and persistent thoughts, images, and/or perceptions of the traumatic event.:  (Status: improved). Experiences frequent nightmares.:  (Status: improved). Has  been exposed to a traumatic event involving actual or perceived threat of death or serious injury.: (Status: maintained). Impairment in social, occupational, or other areas of  functioning.:  (Status: improved). Intentionally avoids thoughts, feelings, or  discussions related to the traumatic event.: (Status: improved). Reports  response of intense fear, helplessness, or horror to the traumatic event.: (Status: improved). Symptoms present more than one month.:(Status: maintained).  Goals 1. Eliminate or reduce the negative impact trauma related symptoms have  on social, occupational, and family functioning. 2. No longer experiences intrusive event recollections, avoidance of event  reminders, intense arousal, or disinterest in activities or  relationships. Objective  Verbalize any symptoms of depression, including any suicidal thoughts. Target Date: 2024-02-03 Frequency:  Biweekly Progress: 80 Modality: individual Objective Participate in Eye Movement Desensitization and Reprocessing (EMDR) to reduce emotional distress  related to traumatic thoughts, feelings, and images. Target Date: 2024-02-03 Frequency: Biweekly Progress: 60 Modality: individual Related Interventions 1. Utilize Eye Movement Desensitization and Reprocessing (EMDR) to reduce the client's  emotional reactivity to the traumatic event and reduce PTSD symptoms. Objective Learn and implement calming skills. Target Date: 2024-02-03 Frequency: Biweekly Progress: 50 Modality: individual Related Interventions 1. Teach the client calming skills (e.g., breathing retraining, relaxation, calming self-talk) to use in and between sessions when feeling overly distressed. Objective Participate in Cognitive Therapy to help identify, challenge, and replace biased, negative, and selfdefeating thoughts resulting from the trauma. Target Date: 2024-02-03 Frequency: Biweekly Progress: 60 Modality: individual Related Interventions 1. Using Cognitive Therapy techniques, explore the client's self-talk and beliefs about self, others,  and the future that are a consequence of the trauma (e.g., themes of safety, trust, power, control,  esteem, and intimacy); identify and challenge biases; assist him/her in generating appraisals that correct for the biases; test biased and alternatives predictions through behavioral experiments. Objective Learn and implement guided self-dialogue to manage thoughts, feelings, and urges brought on by  encounters with trauma-related situations. Target Date: 2024-02-03 Frequency: Biweekly Progress: 60 Modality: individual Related Interventions 1. Teach the client a guided self-dialogue procedure in which he/she learns to recognize  maladaptive self-talk, challenges its biases, copes with engendered feelings, overcomes  avoidance, and reinforces his/her accomplishments; review and  reinforce progress, problemsolve obstacles. Objective Learn and implement approaches for addressing shame and self-disparagement. Target Date: 2024-02-03 Frequency: Biweekly Progress: 70 Modality: individual Diagnosis F43.10 (Posttraumatic stress disorder) - Open -signifier: n/a posttraumatic Stress  Disorder  Conditions For Discharge Achievement of treatment goals and objectives Patient is scheduled for her next session within the next two weeks. The patient approved this treatment plan.   Glade Strausser G [facility], LCSW

## 2023-03-15 ENCOUNTER — Ambulatory Visit: Payer: No Typology Code available for payment source | Admitting: Psychology

## 2023-03-29 ENCOUNTER — Ambulatory Visit: Payer: No Typology Code available for payment source | Admitting: Psychology

## 2023-03-29 DIAGNOSIS — F423 Hoarding disorder: Secondary | ICD-10-CM

## 2023-03-29 DIAGNOSIS — F4310 Post-traumatic stress disorder, unspecified: Secondary | ICD-10-CM

## 2023-03-29 NOTE — Progress Notes (Signed)
                Kristen Brown G Wei Newbrough, LCSW 

## 2023-04-12 ENCOUNTER — Ambulatory Visit: Payer: No Typology Code available for payment source | Admitting: Psychology

## 2023-04-12 DIAGNOSIS — F423 Hoarding disorder: Secondary | ICD-10-CM | POA: Diagnosis not present

## 2023-04-12 DIAGNOSIS — F4310 Post-traumatic stress disorder, unspecified: Secondary | ICD-10-CM

## 2023-04-12 NOTE — Progress Notes (Unsigned)
[facility] Counselor/Therapist Progress Note  Patient ID: Kristen Brown, MRN: [id number],    Date: 04/12/2023  Time Spent: 60 minutes  Time in:  10:00  Time out: 10:58  Treatment Type: Individual Therapy  Reported Symptoms: sadness, frustration  Mental Status Exam: Appearance:  Casual     Behavior: Appropriate  Motor: Normal  Speech/Language:  Normal Rate  Affect: blunted  Mood: distracted  Thought process: normal  Thought content:   WNL  Sensory/Perceptual disturbances:   WNL  Orientation: oriented to person, place, time/date, and situation  Attention: Good  Concentration: Good  Memory: WNL  Fund of knowledge:  Good  Insight:   Good  Judgment:  Good  Impulse Control: Good   Risk Assessment: Danger to Self:  No Self-injurious Behavior: No Danger to Others: No Duty to Warn:no Physical Aggression / Violence:No  Access to Firearms a concern: No  Gang Involvement:No   Subjective: The patient attended a face-to-face individual therapy session via video today.  The patient gave verbal consent for the video to be on Caregility and is aware of the limitations of telehealth. The patient was alone in her car alone and the therapist was in the office.  The patient presents with a blunted affect and mood is anxious.  The patient reports that she is home today and she does not have any food in the house.  We talked about ways for her to find something for her to eat and I gave her some resources such as food pantries.  We also talked about her going out on short-term disability on August 16.  She is going to take a break because of her MS.  We talked about the need for her to make use of her time and to help herself with her hoarding issue.  The patient seems to be struggling with getting anything done and has trouble with motivating herself.  We talked about her thinking about the reason that she might do what she does with her hoarding behavior.  Interventions:  Cognitive Behavioral Therapy, problem solving  Diagnosis:PTSD (post-traumatic stress disorder)  Hoarding disorder  Plan: Client Abilities/Strengths  Intelligent, ability for insight, motivated  Client Treatment Preferences  Outpatient individual therapy  Client Statement of Needs  "[name] feels that I need more help with my trauma than she can offer"  Treatment Level  Outpatient Individual therapy  Symptoms  Displays a significant decline in interest and engagement in activities.:  (Status:  improved). Displays significant psychological and/or physiological distress resulting from internal and  external clues that are reminiscent of the traumatic event.:(Status: maintained). Experiences disturbances in sleep.: (Status: improved). Experiences disturbing  and persistent thoughts, images, and/or perceptions of the traumatic event.:  (Status: improved). Experiences frequent nightmares.:  (Status: improved). Has  been exposed to a traumatic event involving actual or perceived threat of death or serious injury.: (Status: maintained). Impairment in social, occupational, or other areas of  functioning.:  (Status: improved). Intentionally avoids thoughts, feelings, or  discussions related to the traumatic event.: (Status: improved). Reports  response of intense fear, helplessness, or horror to the traumatic event.: (Status: improved). Symptoms present more than one month.:(Status: maintained).  Goals 1. Eliminate or reduce the negative impact trauma related symptoms have  on social, occupational, and family functioning. 2. No longer experiences intrusive event recollections, avoidance of event  reminders, intense arousal, or disinterest in activities or  relationships. Objective Verbalize any symptoms of depression, including any suicidal thoughts. Target Date: 2024-02-03 Frequency: Biweekly Progress:  80 Modality: individual Objective Participate in Eye Movement Desensitization and  Reprocessing (EMDR) to reduce emotional distress  related to traumatic thoughts, feelings, and images. Target Date: 2024-02-03 Frequency: Biweekly Progress: 60 Modality: individual Related Interventions 1. Utilize Eye Movement Desensitization and Reprocessing (EMDR) to reduce the client's  emotional reactivity to the traumatic event and reduce PTSD symptoms. Objective Learn and implement calming skills. Target Date: 2024-02-03 Frequency: Biweekly Progress: 50 Modality: individual Related Interventions 1. Teach the client calming skills (e.g., breathing retraining, relaxation, calming self-talk) to use in and between sessions when feeling overly distressed. Objective Participate in Cognitive Therapy to help identify, challenge, and replace biased, negative, and selfdefeating thoughts resulting from the trauma. Target Date: 2024-02-03 Frequency: Biweekly Progress: 60 Modality: individual Related Interventions 1. Using Cognitive Therapy techniques, explore the client's self-talk and beliefs about self, others,  and the future that are a consequence of the trauma (e.g., themes of safety, trust, power, control,  esteem, and intimacy); identify and challenge biases; assist him/her in generating appraisals that correct for the biases; test biased and alternatives predictions through behavioral experiments. Objective Learn and implement guided self-dialogue to manage thoughts, feelings, and urges brought on by  encounters with trauma-related situations. Target Date: 2024-02-03 Frequency: Biweekly Progress: 60 Modality: individual Related Interventions 1. Teach the client a guided self-dialogue procedure in which he/she learns to recognize  maladaptive self-talk, challenges its biases, copes with engendered feelings, overcomes  avoidance, and reinforces his/her accomplishments; review and reinforce progress, problemsolve obstacles. Objective Learn and implement approaches for addressing shame  and self-disparagement. Target Date: 2024-02-03 Frequency: Biweekly Progress: 70 Modality: individual Diagnosis F43.10 (Posttraumatic stress disorder) - Open -signifier: n/a posttraumatic Stress  Disorder  Conditions For Discharge Achievement of treatment goals and objectives Patient is scheduled for her next session within the next two weeks. The patient approved this treatment plan.   Alona Danford G Azuri Bozard, LCSW

## 2023-04-24 DIAGNOSIS — Z0289 Encounter for other administrative examinations: Secondary | ICD-10-CM

## 2023-04-26 ENCOUNTER — Ambulatory Visit: Payer: No Typology Code available for payment source | Admitting: Psychology

## 2023-05-10 ENCOUNTER — Ambulatory Visit: Payer: No Typology Code available for payment source | Admitting: Psychology

## 2023-05-10 DIAGNOSIS — F423 Hoarding disorder: Secondary | ICD-10-CM

## 2023-05-10 DIAGNOSIS — F4310 Post-traumatic stress disorder, unspecified: Secondary | ICD-10-CM

## 2023-05-10 NOTE — Progress Notes (Signed)
[facility] Counselor/Therapist Progress Note  Patient ID: Kristen Brown, MRN: [id number],    Date: 05/10/2023  Time Spent: 53 minutes  Time in:  9:59  Time out: 10:52  Treatment Type: Individual Therapy  Reported Symptoms: sadness, frustration  Mental Status Exam: Appearance:  Casual     Behavior: Appropriate  Motor: Normal  Speech/Language:  Normal Rate  Affect: blunted  Mood: distracted  Thought process: normal  Thought content:   WNL  Sensory/Perceptual disturbances:   WNL  Orientation: oriented to person, place, time/date, and situation  Attention: Good  Concentration: Good  Memory: WNL  Fund of knowledge:  Good  Insight:   Good  Judgment:  Good  Impulse Control: Good   Risk Assessment: Danger to Self:  No Self-injurious Behavior: No Danger to Others: No Duty to Warn:no Physical Aggression / Violence:No  Access to Firearms a concern: No  Gang Involvement:No   Subjective: The patient attended a face-to-face individual therapy session via video today.  The patient gave verbal consent for the video to be on Caregility and is aware of the limitations of telehealth. The patient was alone in her car alone and the therapist was in the office.  The patient presents with a blunted affect and mood is anxious.  The patient reports that she went out on short-term disability last Friday.  She states that she is resting this week and we talked about her having a plan to start looking for jobs.  The patient reports that she plans to do that next week.  We talked about her procrastination and talked about her not taking care of herself or her home.  I believe the hoarding behavior is because she does not feel like she deserves good things and some of the hoarding behavior is so that she does not have to deal with actually what is under all the things that are going on in her home.  It is the concept of out of sight out of mind.  We talked about doing some EMDR possibly  during the next session to address her feelings like she is worthless or that she does not deserve good things. Interventions: Cognitive Behavioral Therapy, problem solving  Diagnosis:PTSD (post-traumatic stress disorder)  Hoarding disorder  Plan: Client Abilities/Strengths  Intelligent, ability for insight, motivated  Client Treatment Preferences  Outpatient individual therapy  Client Statement of Needs  "Kristen Brown feels that I need more help with my trauma than she can offer"  Treatment Level  Outpatient Individual therapy  Symptoms  Displays a significant decline in interest and engagement in activities.:  (Status:  improved). Displays significant psychological and/or physiological distress resulting from internal and  external clues that are reminiscent of the traumatic event.:(Status: maintained). Experiences disturbances in sleep.: (Status: improved). Experiences disturbing  and persistent thoughts, images, and/or perceptions of the traumatic event.:  (Status: improved). Experiences frequent nightmares.:  (Status: improved). Has  been exposed to a traumatic event involving actual or perceived threat of death or serious injury.: (Status: maintained). Impairment in social, occupational, or other areas of  functioning.:  (Status: improved). Intentionally avoids thoughts, feelings, or  discussions related to the traumatic event.: (Status: improved). Reports  response of intense fear, helplessness, or horror to the traumatic event.: (Status: improved). Symptoms present more than one month.:(Status: maintained).  Goals 1. Eliminate or reduce the negative impact trauma related symptoms have  on social, occupational, and family functioning. 2. No longer experiences intrusive event recollections, avoidance of event  reminders, intense arousal,  or disinterest in activities or  relationships. Objective Verbalize any symptoms of depression, including any suicidal thoughts. Target Date:  2024-02-03 Frequency: Biweekly Progress: 80 Modality: individual Objective Participate in Eye Movement Desensitization and Reprocessing (EMDR) to reduce emotional distress  related to traumatic thoughts, feelings, and images. Target Date: 2024-02-03 Frequency: Biweekly Progress: 60 Modality: individual Related Interventions 1. Utilize Eye Movement Desensitization and Reprocessing (EMDR) to reduce the client's  emotional reactivity to the traumatic event and reduce PTSD symptoms. Objective Learn and implement calming skills. Target Date: 2024-02-03 Frequency: Biweekly Progress: 50 Modality: individual Related Interventions 1. Teach the client calming skills (e.g., breathing retraining, relaxation, calming self-talk) to use in and between sessions when feeling overly distressed. Objective Participate in Cognitive Therapy to help identify, challenge, and replace biased, negative, and selfdefeating thoughts resulting from the trauma. Target Date: 2024-02-03 Frequency: Biweekly Progress: 60 Modality: individual Related Interventions 1. Using Cognitive Therapy techniques, explore the client's self-talk and beliefs about self, others,  and the future that are a consequence of the trauma (e.g., themes of safety, trust, power, control,  esteem, and intimacy); identify and challenge biases; assist him/her in generating appraisals that correct for the biases; test biased and alternatives predictions through behavioral experiments. Objective Learn and implement guided self-dialogue to manage thoughts, feelings, and urges brought on by  encounters with trauma-related situations. Target Date: 2024-02-03 Frequency: Biweekly Progress: 60 Modality: individual Related Interventions 1. Teach the client a guided self-dialogue procedure in which he/she learns to recognize  maladaptive self-talk, challenges its biases, copes with engendered feelings, overcomes  avoidance, and reinforces his/her  accomplishments; review and reinforce progress, problemsolve obstacles. Objective Learn and implement approaches for addressing shame and self-disparagement. Target Date: 2024-02-03 Frequency: Biweekly Progress: 70 Modality: individual Diagnosis F43.10 (Posttraumatic stress disorder) - Open -signifier: n/a posttraumatic Stress  Disorder  Conditions For Discharge Achievement of treatment goals and objectives Patient is scheduled for her next session within the next two weeks. The patient approved this treatment plan.   Chai Verdejo G Sorayah Schrodt, LCSW

## 2023-05-15 NOTE — Telephone Encounter (Signed)
Received Paperwork, completed paperwork and placed in MD box in pod for review and signature

## 2023-05-24 ENCOUNTER — Ambulatory Visit: Payer: No Typology Code available for payment source | Admitting: Psychology

## 2023-05-24 DIAGNOSIS — F423 Hoarding disorder: Secondary | ICD-10-CM | POA: Diagnosis not present

## 2023-05-24 DIAGNOSIS — F4310 Post-traumatic stress disorder, unspecified: Secondary | ICD-10-CM

## 2023-05-24 NOTE — Progress Notes (Signed)
[facility] Counselor/Therapist Progress Note  Patient ID: Kristen Brown, MRN: [id number],    Date: 05/24/2023  Time Spent: 55 minutes  Time in:  10:00 Time out: 10:55  Treatment Type: Individual Therapy  Reported Symptoms: sadness, frustration  Mental Status Exam: Appearance:  Casual     Behavior: Appropriate  Motor: Normal  Speech/Language:  Normal Rate  Affect: blunted  Mood: distracted  Thought process: normal  Thought content:   WNL  Sensory/Perceptual disturbances:   WNL  Orientation: oriented to person, place, time/date, and situation  Attention: Good  Concentration: Good  Memory: WNL  Fund of knowledge:  Good  Insight:   Good  Judgment:  Good  Impulse Control: Good   Risk Assessment: Danger to Self:  No Self-injurious Behavior: No Danger to Others: No Duty to Warn:no Physical Aggression / Violence:No  Access to Firearms a concern: No  Gang Involvement:No   Subjective: The patient attended a face-to-face individual therapy session via video today.  The patient gave verbal consent for the video to be on Caregility and is aware of the limitations of telehealth. The patient was alone in her car alone and the therapist was in the office.  The patient presents with a blunted affect and mood is distracted and somewhat depressed.  The patient reports that she has not been doing well with her emotions.  She has been doing a little more to clean out her house but she reports that she is not where she wants to be.  She talked about her feelings about having MS and feeling like she is a charity case now.  We talked about reframing that and she is just really struggling with feeling like she does not deserve good things and I think in some ways she punishes herself by hoarding and keeping her home the way she does.  We talked about the need to do EMDR and identified some cognitions to focus on for the next session.  Interventions: Cognitive Behavioral Therapy,  problem solving, EMDR  Diagnosis:PTSD (post-traumatic stress disorder)  Hoarding disorder  Plan: Client Abilities/Strengths  Intelligent, ability for insight, motivated  Client Treatment Preferences  Outpatient individual therapy  Client Statement of Needs  "Kristen Brown feels that I need more help with my trauma than she can offer"  Treatment Level  Outpatient Individual therapy  Symptoms  Displays a significant decline in interest and engagement in activities.:  (Status:  improved). Displays significant psychological and/or physiological distress resulting from internal and  external clues that are reminiscent of the traumatic event.:(Status: maintained). Experiences disturbances in sleep.: (Status: improved). Experiences disturbing  and persistent thoughts, images, and/or perceptions of the traumatic event.:  (Status: improved). Experiences frequent nightmares.:  (Status: improved). Has  been exposed to a traumatic event involving actual or perceived threat of death or serious injury.: (Status: maintained). Impairment in social, occupational, or other areas of  functioning.:  (Status: improved). Intentionally avoids thoughts, feelings, or  discussions related to the traumatic event.: (Status: improved). Reports  response of intense fear, helplessness, or horror to the traumatic event.: (Status: improved). Symptoms present more than one month.:(Status: maintained).  Goals 1. Eliminate or reduce the negative impact trauma related symptoms have  on social, occupational, and family functioning. 2. No longer experiences intrusive event recollections, avoidance of event  reminders, intense arousal, or disinterest in activities or  relationships. Objective Verbalize any symptoms of depression, including any suicidal thoughts. Target Date: 2024-02-03 Frequency: Biweekly Progress: 80 Modality: individual Objective Participate in Eye Movement Desensitization  and Reprocessing (EMDR) to reduce  emotional distress  related to traumatic thoughts, feelings, and images. Target Date: 2024-02-03 Frequency: Biweekly Progress: 60 Modality: individual Related Interventions 1. Utilize Eye Movement Desensitization and Reprocessing (EMDR) to reduce the client's  emotional reactivity to the traumatic event and reduce PTSD symptoms. Objective Learn and implement calming skills. Target Date: 2024-02-03 Frequency: Biweekly Progress: 50 Modality: individual Related Interventions 1. Teach the client calming skills (e.g., breathing retraining, relaxation, calming self-talk) to use in and between sessions when feeling overly distressed. Objective Participate in Cognitive Therapy to help identify, challenge, and replace biased, negative, and selfdefeating thoughts resulting from the trauma. Target Date: 2024-02-03 Frequency: Biweekly Progress: 60 Modality: individual Related Interventions 1. Using Cognitive Therapy techniques, explore the client's self-talk and beliefs about self, others,  and the future that are a consequence of the trauma (e.g., themes of safety, trust, power, control,  esteem, and intimacy); identify and challenge biases; assist him/her in generating appraisals that correct for the biases; test biased and alternatives predictions through behavioral experiments. Objective Learn and implement guided self-dialogue to manage thoughts, feelings, and urges brought on by  encounters with trauma-related situations. Target Date: 2024-02-03 Frequency: Biweekly Progress: 60 Modality: individual Related Interventions 1. Teach the client a guided self-dialogue procedure in which he/she learns to recognize  maladaptive self-talk, challenges its biases, copes with engendered feelings, overcomes  avoidance, and reinforces his/her accomplishments; review and reinforce progress, problemsolve obstacles. Objective Learn and implement approaches for addressing shame and  self-disparagement. Target Date: 2024-02-03 Frequency: Biweekly Progress: 70 Modality: individual Diagnosis F43.10 (Posttraumatic stress disorder) - Open -signifier: n/a posttraumatic Stress  Disorder  Conditions For Discharge Achievement of treatment goals and objectives Patient is scheduled for her next session within the next two weeks. The patient approved this treatment plan.   Aubreigh Fuerte G Wilmer Berryhill, LCSW

## 2023-05-25 ENCOUNTER — Encounter: Payer: Self-pay | Admitting: Neurology

## 2023-06-07 ENCOUNTER — Ambulatory Visit (INDEPENDENT_AMBULATORY_CARE_PROVIDER_SITE_OTHER): Payer: No Typology Code available for payment source | Admitting: Psychology

## 2023-06-07 DIAGNOSIS — F4310 Post-traumatic stress disorder, unspecified: Secondary | ICD-10-CM | POA: Diagnosis not present

## 2023-06-07 DIAGNOSIS — F423 Hoarding disorder: Secondary | ICD-10-CM | POA: Diagnosis not present

## 2023-06-07 NOTE — Progress Notes (Unsigned)
                Diona Peregoy G Denario Bagot, LCSW 

## 2023-06-12 ENCOUNTER — Ambulatory Visit: Payer: No Typology Code available for payment source | Admitting: Family Medicine

## 2023-06-21 ENCOUNTER — Ambulatory Visit: Payer: No Typology Code available for payment source | Admitting: Psychology

## 2023-06-28 ENCOUNTER — Encounter: Payer: Self-pay | Admitting: Neurology

## 2023-06-28 ENCOUNTER — Ambulatory Visit (INDEPENDENT_AMBULATORY_CARE_PROVIDER_SITE_OTHER): Payer: No Typology Code available for payment source | Admitting: Neurology

## 2023-06-28 VITALS — BP 145/90 | HR 69 | Ht 71.0 in | Wt 336.0 lb

## 2023-06-28 DIAGNOSIS — G4733 Obstructive sleep apnea (adult) (pediatric): Secondary | ICD-10-CM | POA: Diagnosis not present

## 2023-06-28 DIAGNOSIS — G35 Multiple sclerosis: Secondary | ICD-10-CM

## 2023-06-28 DIAGNOSIS — R269 Unspecified abnormalities of gait and mobility: Secondary | ICD-10-CM

## 2023-06-28 DIAGNOSIS — G4719 Other hypersomnia: Secondary | ICD-10-CM

## 2023-06-28 DIAGNOSIS — G35D Multiple sclerosis, unspecified: Secondary | ICD-10-CM

## 2023-06-28 DIAGNOSIS — Z79899 Other long term (current) drug therapy: Secondary | ICD-10-CM

## 2023-06-28 DIAGNOSIS — E559 Vitamin D deficiency, unspecified: Secondary | ICD-10-CM | POA: Diagnosis not present

## 2023-06-28 MED ORDER — PHENTERMINE HCL 15 MG PO CAPS: 15.0000 mg | ORAL_CAPSULE | ORAL | 5 refills | Status: DC

## 2023-06-28 NOTE — Progress Notes (Signed)
[facility]  PATIENT: Kristen Brown DOB: [birth year]  REFERRING DOCTOR OR PCP:  Sigmund Hazel, MD SOURCE: patient, notes from Dr. Hyacinth Meeker and Dr. Gaynelle Adu, imaging and lab reports, MRI images on PACS  _________________________________   HISTORICAL  CHIEF COMPLAINT:  Chief Complaint  Patient presents with   Room 10    Pt is here Alone. Pt states that things have been going the same with her MS. Pt states that she came out of work due to her having to be on her feet all day and being in pain because of her being on her feet all day.    Update 06/28/2023 She is doing well but notes continued fatigue.  She has no new neurologic symptom.     She switched to Ocrevus in 2022 (was DMF) and is tolerating it well.    Her next dose is July 04, 2023.  She had labs checked last week.  The CD19/CD20 count was 0 showing excellent longevity of the Ocrevus dose.  IgG and IgM were fine.   She denies exacerbations or new symptoms.   MRI may 2023showed no new MS lesions.  She has mildly reduced gait due to right foot drop.  Stairs are difficult and she holds the bannister and takes one step at a time   She has some hand and leg numbness (right) at times.  THis is mild and not painful but hands get numb more often.  Sometimes the numbness will wake her up at night.     She has urinary urgency with rare incontinence.   Has also had rare bowel incontinence.   Vision is ok - mild blurry but better than last year.    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    She has some verbal fluency issues but nothing newg.    She sometimes notes reduced focus and attnetion. She has some anxiety.   She sees a Veterinary surgeon.    Migraines are much better and now rare. .  She has mild OSA (AHI = 11.2) and tried CPAP x 6 months but stopped as EDS did not improve.   She still was waking up a lot at night.   She  takes gabapentin .    SHe has ESS of 20/24.   Does not have sleep paralysis or cataplexy.  Dreams can be vivid.     She is not sure if armodafinil helped her any..   She had jitteriness with phentermine though it did help the sleepiness.   We discussed that mild OSA often responds to weight loss.   She has loss weight (40 pounds)  EPWORTH SLEEPINESS SCALE  On a scale of 0 - 3 what is the chance of dozing:  Sitting and Reading:   2 Watching TV:    3 Sitting inactive in a public place: 3 Passenger in car for one hour: 3 Lying down to rest in the afternoon: 3 Sitting and talking to someone: 1 Sitting quietly after lunch:  3 In a car, stopped in traffic:  2  Total (out of 24):    20/24    severe daytime sleepiness      MS HISTORY: She was diagnosed with MS in November 2014.    In early 2014,, she had numbness in her legs that was ust present one morning.  It improved over a few weeks and she did  not see any doctors.    Later that year, she had diplopia that was preent one morning.   Symptoms took 6 months to mostly recover.   She feels worse if very tired or hot.   She had an MRi of the brain showing multiple lesions c/with MS.   3 lesions enhanced.    She was referred to Dr Gaynelle Adu at [facility].   She started Tecfidera.    She needed to titrate slow;y due to GI upset when she tried a higher dose more rapidly.    She no longer has GI issues but has occasional flushing still that responds to aspirin.     Since starting the medication she did not had definite exacerbation until possibly earlier this year.   She had an exacerbation 01/2021 and MRI showed multipke enhancing lesions.   She was placed on Ocrevus in mid 2022.Marland Kitchen      MRI of the brain 05/11/2018 showed 1 or 2 more foci that were not present compared to the 2014 MRI including one in the middle cerebellar peduncle.  MRI of the cervical spine shows 1 probable MS focus.  MRI [facility] 03/06/21 showed multiple T2/FLAIR hyperintense foci in the cerebral  hemispheres, thalamus, brainstem and left middle cerebellar peduncle in a pattern consistent with demyelinating plaque associated with multiple sclerosis.  5 foci in the cerebral hemispheres enhance after gadolinium contrast consistent with acute demyelinating plaque.  Several nonenhancing foci in the hemispheres, 2 foci in the thalamus and 1 in the midbrain are present on the current MRI but were not present on the 2019 MRI.  MRI cervical spine 03/06/2021 showed multiple T2 hyperintense foci within the spinal cord in a pattern consistent with demyelination associated with multiple sclerosis.  All but one of the foci are new compared to the 2019 MRI..  3 foci at C4-C5 and C7 enhance after contrast.  This is consistent with acute demyelinating plaques.  MRI of the brain 02/09/2022 showed T2/FLAIR hyperintense foci in the cerebral hemispheres, thalamus, brainstem and left middle cerebellar peduncle in a pattern consistent with chronic demyelinating plaque associated with multiple sclerosis.  None of the foci appear to be acute.  They do not enhance.  Compared to the MRI from 03/06/2021, there were no new lesions.   REVIEW OF SYSTEMS: Constitutional: No fevers, chills, sweats, or change in appetite.   She has fatigue. Eyes: No visual changes, double vision, eye pain Ear, nose and throat: No hearing loss, ear pain, nasal congestion, sore throat Cardiovascular: No chest pain, palpitations Respiratory:  No shortness of breath at rest or with exertion.   Mild OSA GastrointestinaI: No nausea, vomiting, diarrhea, abdominal pain, fecal incontinence Genitourinary:  No dysuria, urinary retention or frequency.  No nocturia. Musculoskeletal:  No neck pain, back pain Integumentary: No rash, pruritus, skin lesions Neurological: as above Psychiatric: No depression at this time.  No anxiety Endocrine: No palpitations, diaphoresis, change in appetite, change in weigh or increased thirst Hematologic/Lymphatic:  No  anemia, purpura, petechiae. Allergic/Immunologic: No itchy/runny eyes, nasal congestion, recent allergic reactions, rashes  ALLERGIES: Allergies  Allergen Reactions   Sulfa Antibiotics Hives   Adhesive [Tape] Rash   Latex Rash    HOME MEDICATIONS:  Current Outpatient Medications:    ARIPiprazole (ABILIFY) 5 MG tablet, Take 5 mg by mouth daily., Disp: , Rfl:    Cholecalciferol (VITAMIN D3) 125 MCG (5000 UT) TABS, Take 1 tablet by mouth daily., Disp: , Rfl:    diclofenac (VOLTAREN) 75 MG EC tablet,  Take 1 tablet (75 mg total) by mouth 2 (two) times daily., Disp: 30 tablet, Rfl: 2   DULoxetine (CYMBALTA) 60 MG capsule, Take 60 mg by mouth daily., Disp: , Rfl:    escitalopram (LEXAPRO) 20 MG tablet, TAKE 1 TABLET BY MOUTH EVERY DAY, Disp: 90 tablet, Rfl: 0   gabapentin (NEURONTIN) 300 MG capsule, TAKE 1 CAPSULE BY MOUTH THREE TIMES A DAY, Disp: 90 capsule, Rfl: 11   ibuprofen (ADVIL,MOTRIN) 800 MG tablet, Take 1 tablet (800 mg total) by mouth 3 (three) times daily., Disp: 21 tablet, Rfl: 0   loratadine (CLARITIN) 10 MG tablet, Take 10 mg by mouth daily., Disp: , Rfl:    LORazepam (ATIVAN) 0.5 MG tablet, Take 0.5 mg by mouth every 8 (eight) hours., Disp: , Rfl:    magnesium gluconate (MAGONATE) 500 MG tablet, Take 250 mg by mouth daily., Disp: , Rfl:    norgestimate-ethinyl estradiol (ORTHO-CYCLEN) 0.25-35 MG-MCG tablet, Take 1 tablet by mouth daily., Disp: , Rfl:    ocrelizumab (OCREVUS) 300 MG/10ML injection, Inject 600 mg into the vein every 6 (six) months., Disp: , Rfl:    SUMAtriptan (IMITREX) 50 MG tablet, TAKE 1 TAB(S) ORALLY ONCE, CAN REPEAT 2 HOURS LATER IF NEEDED, Disp: , Rfl: 0   phentermine 15 MG capsule, Take 1 capsule (15 mg total) by mouth every morning., Disp: 30 capsule, Rfl: 5  PAST MEDICAL HISTORY: Past Medical History:  Diagnosis Date   Anxiety    Depression    Gallstones 07/2014   GERD (gastroesophageal reflux disease)    no current med.   Irritable bowel syndrome  (IBS)    Migraines    Multiple sclerosis (HCC)    Sinus infection 08/11/2014   will be finished with antibiotic prior to surgery   Vision abnormalities     PAST SURGICAL HISTORY: Past Surgical History:  Procedure Laterality Date   CHOLECYSTECTOMY N/A 08/19/2014   Procedure: LAPAROSCOPIC CHOLECYSTECTOMY WITH INTRAOPERATIVE CHOLANGIOGRAM;  Surgeon: Harriette Bouillon, MD;  Location: [facility];  Service: General;  Laterality: N/A;   NO PAST SURGERIES      FAMILY HISTORY: Family History  Problem Relation Age of Onset   Hyperlipidemia Mother    Hyperlipidemia Father    Diabetes Father    Atrial fibrillation Father    Cancer Paternal Grandfather        colon   Healthy Brother    Lung cancer Maternal Grandmother    Diabetes Paternal Grandmother     SOCIAL HISTORY:  Social History   Socioeconomic History   Marital status: Single    Spouse name: Not on file   Number of children: Not on file   Years of education: Not on file   Highest education level: Not on file  Occupational History   Not on file  Tobacco Use   Smoking status: Never   Smokeless tobacco: Never  Substance and Sexual Activity   Alcohol use: No   Drug use: No   Sexual activity: Not on file  Other Topics Concern   Not on file  Social History Narrative   Not on file   Social Determinants of Health   Financial Resource Strain: Not on file  Food Insecurity: Not on file  Transportation Needs: Not on file  Physical Activity: Not on file  Stress: Not on file  Social Connections: Not on file  Intimate Partner Violence: Not on file     PHYSICAL EXAM  Vitals:   06/28/23 0836  BP: (!) 145/90  Pulse: 69  Weight: (!) 336 lb (152.4 kg)  Height: 5\' 11"  (1.803 m)    Body mass index is 46.86 kg/m.   General: The patient is well-developed and well-nourished and in no acute distress  Neck: The neck is supple with good ROM  Skin: Extremities are without significant edema.  Neurologic  Exam  Mental status: The patient is alert and oriented x 3 at the time of the examination. The patient has apparent normal recent and remote memory, with an apparently normal attention span and concentration ability.   Speech is normal.  Cranial nerves: Extraocular movements are full.  Facial strength and sensation was normal.  No dysarthria.Marland Kitchen  Hearing seems symmetric.  Motor:  Muscle bulk is normal.   Tone is normal. Strength is  5 / 5 in all 4 extremities except 4/5 right foot/tpe extensors..   Sensory: Sensory testing shows reduced vibration and temperature sensation in hte right arm and leg.   Touch was more sytmmetric.    Coordination: Cerebellar testing reveals good finger-nose-finger and reduced right heel-to-shin   Gait and station: Station is normal.   The gait was wide with right foot drop  Tandem gait was wide.  Turn was unsteady.. Romberg is negative.   Reflexes: Deep tendon reflexes are symmetric and normal bilaterally.       DIAGNOSTIC DATA (LABS, IMAGING, TESTING) - I reviewed patient records, labs, notes, testing and imaging myself where available.  Lab Results  Component Value Date   WBC 12.0 (H) 05/20/2022   HGB 12.5 05/20/2022   HCT 39.3 05/20/2022   MCV 86.2 05/20/2022   PLT 218 05/20/2022      Component Value Date/Time   NA 138 05/20/2022 1238   NA 139 03/09/2021 1552   K 3.7 05/20/2022 1238   CL 105 05/20/2022 1238   CO2 24 05/20/2022 1238   GLUCOSE 131 (H) 05/20/2022 1238   BUN 12 05/20/2022 1238   BUN 10 03/09/2021 1552   CREATININE 0.68 05/20/2022 1238   CALCIUM 8.7 (L) 05/20/2022 1238   PROT 6.9 03/09/2021 1552   ALBUMIN 4.4 03/09/2021 1552   AST 12 03/09/2021 1552   ALT 14 03/09/2021 1552   ALKPHOS 85 03/09/2021 1552   BILITOT 0.3 03/09/2021 1552   GFRNONAA >60 05/20/2022 1238   GFRAA >90 08/12/2014 1220      ASSESSMENT AND PLAN  Multiple sclerosis (HCC) - Plan: IgG, IgA, IgM, CBC with Differential/Platelet  High risk medication use  - Plan: IgG, IgA, IgM, CBC with Differential/Platelet  OSA (obstructive sleep apnea)  Vitamin D deficiency  Excessive daytime sleepiness  Gait disturbance  1.  She will continue Ocrevus.  CD20 counts were 0 at 5 1/2 months.   Recheck CBC/D and IgG and IgM.  We will check an MRI of the brain sometime in 2025.  Her last MRI showed no new lesions. 2.  stay active and exercise as tolerated.  3.  Lower dose of Phentermine for EDS/fatigue and weight loss.  Could also consider a low dose Adderall or Ritalin 4.  Due to reduced gait/balance, she is unable to work in Engineering geologist. 5.   Return in 6 months or sooner if there are new or worsening neurologic symptoms.  Amil Bouwman A. Epimenio Foot, MD, [facility] 06/28/2023, 8:58 AM Certified in Neurology, Clinical Neurophysiology, Sleep Medicine, Pain Medicine and Neuroimaging  [facility] [address] [place], [place] [address] [phone]

## 2023-06-29 LAB — CBC WITH DIFFERENTIAL/PLATELET
Basophils Absolute: 0.1 10*3/uL (ref 0.0–0.2)
Basos: 1 %
EOS (ABSOLUTE): 0.2 10*3/uL (ref 0.0–0.4)
Eos: 3 %
Hematocrit: 39.2 % (ref 34.0–46.6)
Hemoglobin: 12.4 g/dL (ref 11.1–15.9)
Immature Grans (Abs): 0.1 10*3/uL (ref 0.0–0.1)
Immature Granulocytes: 1 %
Lymphocytes Absolute: 1.9 10*3/uL (ref 0.7–3.1)
Lymphs: 21 %
MCH: 28.2 pg (ref 26.6–33.0)
MCHC: 31.6 g/dL (ref 31.5–35.7)
MCV: 89 fL (ref 79–97)
Monocytes Absolute: 0.6 10*3/uL (ref 0.1–0.9)
Monocytes: 7 %
Neutrophils Absolute: 6 10*3/uL (ref 1.4–7.0)
Neutrophils: 67 %
Platelets: 182 10*3/uL (ref 150–450)
RBC: 4.39 x10E6/uL (ref 3.77–5.28)
RDW: 13.7 % (ref 11.7–15.4)
WBC: 8.9 10*3/uL (ref 3.4–10.8)

## 2023-06-29 LAB — IGG, IGA, IGM
IgA/Immunoglobulin A, Serum: 181 mg/dL (ref 87–352)
IgG (Immunoglobin G), Serum: 682 mg/dL (ref 586–1602)
IgM (Immunoglobulin M), Srm: 91 mg/dL (ref 26–217)

## 2023-07-05 ENCOUNTER — Ambulatory Visit (INDEPENDENT_AMBULATORY_CARE_PROVIDER_SITE_OTHER): Payer: Self-pay | Admitting: Psychology

## 2023-07-05 DIAGNOSIS — F4310 Post-traumatic stress disorder, unspecified: Secondary | ICD-10-CM

## 2023-07-05 DIAGNOSIS — F423 Hoarding disorder: Secondary | ICD-10-CM | POA: Diagnosis not present

## 2023-07-05 NOTE — Progress Notes (Signed)
Behavioral Health Counselor/Therapist Progress Note  Patient ID: RAFEEF Brown, MRN: [id number],    Date: 07/05/2023  Time Spent: 59 minutes  Time in:  10:00 Time out: 10:59  Treatment Type: Individual Therapy  Reported Symptoms: sadness, frustration  Mental Status Exam: Appearance:  Casual     Behavior: Appropriate  Motor: Normal  Speech/Language:  Normal Rate  Affect: blunted  Mood: pleasant  Thought process: normal  Thought content:   WNL  Sensory/Perceptual disturbances:   WNL  Orientation: oriented to person, place, time/date, and situation  Attention: Good  Concentration: Good  Memory: WNL  Fund of knowledge:  Good  Insight:   Good  Judgment:  Good  Impulse Control: Good   Risk Assessment: Danger to Self:  No Self-injurious Behavior: No Danger to Others: No Duty to Warn:no Physical Aggression / Violence:No  Access to Firearms a concern: No  Gang Involvement:No   Subjective: The patient attended a face-to-face individual therapy session via video today.  The patient gave verbal consent for the video to be on Caregility and is aware of the limitations of telehealth. The patient was alone in her car alone and the therapist was in the office.  The patient reports that things are going well at present.  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  We talked about how she was handling it and she does seem to be managing it relatively well right now.  I did express some concern that it could trigger her trauma if she was not careful about how she move forward with doing this.  She reports that she is making more money than she made having her full-time job and we talked about her liking what she is doing.  She also is involved in some romantic situations where she says that she loves the person and we talked about the potential concerns about those  things that she is involved with as well.  We will continue to monitor how she is doing with the new endeavors and encouraged her to make sure that she remains safe from an emotional perspective.  Interventions: Cognitive Behavioral Therapy, problem solving, EMDR  Diagnosis:PTSD (post-traumatic stress disorder)  Hoarding disorder  Plan: Client Abilities/Strengths  Intelligent, ability for insight, motivated  Client Treatment Preferences  Outpatient individual therapy  Client Statement of Needs  "[name] feels that I need more help with my trauma than she can offer"  Treatment Level  Outpatient Individual therapy  Symptoms  Displays a significant decline in interest and engagement in activities.:  (Status:  improved). Displays significant psychological and/or physiological distress resulting from internal and  external clues that are reminiscent of the traumatic event.:(Status: maintained). Experiences disturbances in sleep.: (Status: improved). Experiences disturbing  and persistent thoughts, images, and/or perceptions of the traumatic event.:  (Status: improved). Experiences frequent nightmares.:  (Status: improved). Has  been exposed to a traumatic event involving actual or perceived threat of death or serious injury.: (Status: maintained). Impairment in social, occupational, or other areas of  functioning.:  (Status: improved). Intentionally avoids thoughts, feelings, or  discussions related to the traumatic event.: (Status: improved). Reports  response of intense fear, helplessness, or horror to the traumatic event.: (Status: improved). Symptoms present more than one month.:(Status: maintained).  Goals 1. Eliminate or reduce the negative impact trauma related symptoms have  on social, occupational, and family functioning.  2. No longer experiences intrusive event recollections, avoidance of event  reminders, intense arousal, or disinterest in activities or   relationships. Objective Verbalize any symptoms of depression, including any suicidal thoughts. Target Date: 2024-02-03 Frequency: Biweekly Progress: 80 Modality: individual Objective Participate in Eye Movement Desensitization and Reprocessing (EMDR) to reduce emotional distress  related to traumatic thoughts, feelings, and images. Target Date: 2024-02-03 Frequency: Biweekly Progress: 60 Modality: individual Related Interventions 1. Utilize Eye Movement Desensitization and Reprocessing (EMDR) to reduce the client's  emotional reactivity to the traumatic event and reduce PTSD symptoms. Objective Learn and implement calming skills. Target Date: 2024-02-03 Frequency: Biweekly Progress: 50 Modality: individual Related Interventions 1. Teach the client calming skills (e.g., breathing retraining, relaxation, calming self-talk) to use in and between sessions when feeling overly distressed. Objective Participate in Cognitive Therapy to help identify, challenge, and replace biased, negative, and selfdefeating thoughts resulting from the trauma. Target Date: 2024-02-03 Frequency: Biweekly Progress: 60 Modality: individual Related Interventions 1. Using Cognitive Therapy techniques, explore the client's self-talk and beliefs about self, others,  and the future that are a consequence of the trauma (e.g., themes of safety, trust, power, control,  esteem, and intimacy); identify and challenge biases; assist him/her in generating appraisals that correct for the biases; test biased and alternatives predictions through behavioral experiments. Objective Learn and implement guided self-dialogue to manage thoughts, feelings, and urges brought on by  encounters with trauma-related situations. Target Date: 2024-02-03 Frequency: Biweekly Progress: 60 Modality: individual Related Interventions 1. Teach the client a guided self-dialogue procedure in which he/she learns to recognize  maladaptive  self-talk, challenges its biases, copes with engendered feelings, overcomes  avoidance, and reinforces his/her accomplishments; review and reinforce progress, problemsolve obstacles. Objective Learn and implement approaches for addressing shame and self-disparagement. Target Date: 2024-02-03 Frequency: Biweekly Progress: 70 Modality: individual Diagnosis F43.10 (Posttraumatic stress disorder) - Open -signifier: n/a posttraumatic Stress  Disorder  Conditions For Discharge Achievement of treatment goals and objectives Patient is scheduled for her next session within the next two weeks. The patient approved this treatment plan.   Kristen Firmin G Cyndy Braver, LCSW

## 2023-07-19 ENCOUNTER — Ambulatory Visit: Payer: No Typology Code available for payment source | Admitting: Psychology

## 2023-08-02 ENCOUNTER — Ambulatory Visit: Payer: Self-pay | Admitting: Psychology

## 2023-08-16 ENCOUNTER — Ambulatory Visit: Payer: Self-pay | Admitting: Psychology

## 2023-08-30 ENCOUNTER — Ambulatory Visit: Payer: No Typology Code available for payment source | Admitting: Psychology

## 2023-09-19 ENCOUNTER — Other Ambulatory Visit: Payer: Self-pay | Admitting: Neurology

## 2023-09-19 NOTE — Telephone Encounter (Signed)
Rx refilled per last office visit note.

## 2023-10-30 DIAGNOSIS — R519 Headache, unspecified: Secondary | ICD-10-CM | POA: Diagnosis not present

## 2023-10-30 DIAGNOSIS — N926 Irregular menstruation, unspecified: Secondary | ICD-10-CM | POA: Diagnosis not present

## 2023-11-13 DIAGNOSIS — Z87898 Personal history of other specified conditions: Secondary | ICD-10-CM | POA: Diagnosis not present

## 2023-11-13 DIAGNOSIS — N898 Other specified noninflammatory disorders of vagina: Secondary | ICD-10-CM | POA: Diagnosis not present

## 2023-12-18 ENCOUNTER — Other Ambulatory Visit: Payer: Self-pay | Admitting: Family Medicine

## 2023-12-18 DIAGNOSIS — Z1231 Encounter for screening mammogram for malignant neoplasm of breast: Secondary | ICD-10-CM

## 2023-12-19 ENCOUNTER — Ambulatory Visit
Admission: RE | Admit: 2023-12-19 | Discharge: 2023-12-19 | Disposition: A | Discharge status: Home / Self Care | Source: Ambulatory Visit | Attending: Family Medicine | Admitting: Family Medicine

## 2023-12-19 DIAGNOSIS — Z1231 Encounter for screening mammogram for malignant neoplasm of breast: Secondary | ICD-10-CM

## 2024-01-02 ENCOUNTER — Telehealth: Payer: Self-pay | Admitting: Neurology

## 2024-01-02 NOTE — Telephone Encounter (Signed)
 [facility] Kristen Brown) Requesting verbal order for  ocrelizumab (OCREVUS) 300 MG/10ML injection.

## 2024-01-02 NOTE — Telephone Encounter (Signed)
 Called pharmacy back. Spoke w/ Abran Abrahams. Transferred me to pharmacist, Fabio Holts. Provided VO for Ocrevus. Notified [facility] that VO provided. Nothing further needed at this time.

## 2024-01-03 ENCOUNTER — Other Ambulatory Visit: Payer: Self-pay | Admitting: Neurology

## 2024-01-03 ENCOUNTER — Encounter: Payer: Self-pay | Admitting: Neurology

## 2024-01-03 ENCOUNTER — Ambulatory Visit: Payer: No Typology Code available for payment source | Admitting: Neurology

## 2024-01-03 VITALS — BP 125/80 | HR 71 | Ht 70.0 in | Wt 369.0 lb

## 2024-01-03 DIAGNOSIS — R269 Unspecified abnormalities of gait and mobility: Secondary | ICD-10-CM

## 2024-01-03 DIAGNOSIS — Z79899 Other long term (current) drug therapy: Secondary | ICD-10-CM

## 2024-01-03 DIAGNOSIS — F32A Depression, unspecified: Secondary | ICD-10-CM

## 2024-01-03 DIAGNOSIS — G35 Multiple sclerosis: Secondary | ICD-10-CM

## 2024-01-03 MED ORDER — ESCITALOPRAM OXALATE 20 MG PO TABS: 20.0000 mg | ORAL_TABLET | Freq: Every day | ORAL | 3 refills | Status: DC

## 2024-01-03 MED ORDER — PHENTERMINE HCL 30 MG PO CAPS: 30.0000 mg | ORAL_CAPSULE | ORAL | 1 refills | Status: DC

## 2024-01-03 NOTE — Progress Notes (Signed)
 [facility]  PATIENT: Kristen Brown DOB: 11/03/1975  REFERRING DOCTOR OR PCP:  Kristen Hazel, MD SOURCE: patient, notes from Dr. Hyacinth Meeker and Dr. Gaynelle Adu, imaging and lab reports, MRI images on PACS  _________________________________   HISTORICAL  CHIEF COMPLAINT:  Chief Complaint  Patient presents with   Follow-up    Pt in 10 alone Pt here for MS Pt states increased fatigue Pt states increased fatigue in both legs Pt states not able to sit or walk for a long time    Update 06/28/2023 She is doing well but notes continued fatigue.  She has no new neurologic symptom.     She switched to Ocrevus in 2022 (was DMF) and is tolerating it well.    Her next dose is July 04, 2023.  She had labs checked last week.  The CD19/CD20 count was 0 showing excellent longevity of the Ocrevus dose.  IgG and IgM were fine.   She denies exacerbations or new symptoms.   MRI may 2023showed no new MS lesions.  She has mildly reduced gait due to right foot drop.  Stairs are difficult and she holds the bannister and takes one step at a time   She has some hand and leg numbness (right) at times.  THis is mild and not painful but hands get numb more often.  Sometimes the numbness will wake her up at night.     She has urinary urgency with rare incontinence.   Has also had rare bowel incontinence.   Vision is ok - mild blurry but better than last year.    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    She has some verbal fluency issues but nothing newg.    She sometimes notes reduced focus and attnetion. She has some anxiety.   She sees a Veterinary surgeon.    Migraines are much better and now rare. .  She has mild OSA (AHI = 11.2) and tried CPAP x 6 months but stopped as EDS did not improve.   She still was waking up a lot at night.   She takes gabapentin .    SHe has ESS of 20/24.   Does not have sleep  paralysis or cataplexy.  Dreams can be vivid.     She is not sure if armodafinil helped her any..   She had jitteriness with phentermine though it did help the sleepiness.   We discussed that mild OSA often responds to weight loss.   She lost 30 pounds but gained 10 miles back.  She is on Wegovy and phntermine.     EPWORTH SLEEPINESS SCALE  On a scale of 0 - 3 what is the chance of dozing:  Sitting and Reading:   2 Watching TV:    3 Sitting inactive in a public place: 3 Passenger in car for one hour: 3 Lying down to rest in the afternoon: 3 Sitting and talking to someone: 1 Sitting quietly after lunch:  3 In a car, stopped in traffic:  2  Total (out of 24):    20/24    severe daytime sleepiness        MS HISTORY: She was diagnosed with MS in November 2014.    In early 2014,, she had numbness in her legs that was ust present one morning.  It improved over a few weeks and she did not see any  doctors.    Later that year, she had diplopia that was preent one morning.   Symptoms took 6 months to mostly recover.   She feels worse if very tired or hot.   She had an MRi of the brain showing multiple lesions c/with MS.   3 lesions enhanced.    She was referred to Dr Gaynelle Adu at [facility].   She started Tecfidera.    She needed to titrate slow;y due to GI upset when she tried a higher dose more rapidly.    She no longer has GI issues but has occasional flushing still that responds to aspirin.     Since starting the medication she did not had definite exacerbation until possibly earlier this year.   She had an exacerbation 01/2021 and MRI showed multipke enhancing lesions.   She was placed on Ocrevus in mid 2022.Marland Kitchen      MRI of the brain 05/11/2018 showed 1 or 2 more foci that were not present compared to the 2014 MRI including one in the middle cerebellar peduncle.  MRI of the cervical spine shows 1 probable MS focus.  MRI [facility] 03/06/21 showed multiple T2/FLAIR hyperintense foci in the cerebral hemispheres,  thalamus, brainstem and left middle cerebellar peduncle in a pattern consistent with demyelinating plaque associated with multiple sclerosis.  5 foci in the cerebral hemispheres enhance after gadolinium contrast consistent with acute demyelinating plaque.  Several nonenhancing foci in the hemispheres, 2 foci in the thalamus and 1 in the midbrain are present on the current MRI but were not present on the 2019 MRI.  MRI cervical spine 03/06/2021 showed multiple T2 hyperintense foci within the spinal cord in a pattern consistent with demyelination associated with multiple sclerosis.  All but one of the foci are new compared to the 2019 MRI..  3 foci at C4-C5 and C7 enhance after contrast.  This is consistent with acute demyelinating plaques.  MRI of the brain 02/09/2022 showed T2/FLAIR hyperintense foci in the cerebral hemispheres, thalamus, brainstem and left middle cerebellar peduncle in a pattern consistent with chronic demyelinating plaque associated with multiple sclerosis.  None of the foci appear to be acute.  They do not enhance.  Compared to the MRI from 03/06/2021, there were no new lesions.   REVIEW OF SYSTEMS: Constitutional: No fevers, chills, sweats, or change in appetite.   She has fatigue. Eyes: No visual changes, double vision, eye pain Ear, nose and throat: No hearing loss, ear pain, nasal congestion, sore throat Cardiovascular: No chest pain, palpitations Respiratory:  No shortness of breath at rest or with exertion.   Mild OSA GastrointestinaI: No nausea, vomiting, diarrhea, abdominal pain, fecal incontinence Genitourinary:  No dysuria, urinary retention or frequency.  No nocturia. Musculoskeletal:  No neck pain, back pain Integumentary: No rash, pruritus, skin lesions Neurological: as above Psychiatric: No depression at this time.  No anxiety Endocrine: No palpitations, diaphoresis, change in appetite, change in weigh or increased thirst Hematologic/Lymphatic:  No anemia, purpura,  petechiae. Allergic/Immunologic: No itchy/runny eyes, nasal congestion, recent allergic reactions, rashes  ALLERGIES: Allergies  Allergen Reactions   Sulfa Antibiotics Hives   Adhesive [Tape] Rash   Latex Rash    HOME MEDICATIONS:  Current Outpatient Medications:    ARIPiprazole (ABILIFY) 5 MG tablet, Take 5 mg by mouth daily., Disp: , Rfl:    Cholecalciferol (VITAMIN D3) 125 MCG (5000 UT) TABS, Take 1 tablet by mouth daily., Disp: , Rfl:    diclofenac (VOLTAREN) 75 MG EC tablet, Take 1 tablet (  75 mg total) by mouth 2 (two) times daily., Disp: 30 tablet, Rfl: 2   DULoxetine (CYMBALTA) 60 MG capsule, Take 60 mg by mouth daily., Disp: , Rfl:    gabapentin (NEURONTIN) 300 MG capsule, TAKE 1 CAPSULE BY MOUTH THREE TIMES A DAY, Disp: 90 capsule, Rfl: 11   ibuprofen (ADVIL,MOTRIN) 800 MG tablet, Take 1 tablet (800 mg total) by mouth 3 (three) times daily., Disp: 21 tablet, Rfl: 0   loratadine (CLARITIN) 10 MG tablet, Take 10 mg by mouth daily., Disp: , Rfl:    LORazepam (ATIVAN) 0.5 MG tablet, Take 0.5 mg by mouth every 8 (eight) hours., Disp: , Rfl:    magnesium gluconate (MAGONATE) 500 MG tablet, Take 250 mg by mouth daily., Disp: , Rfl:    norgestimate-ethinyl estradiol (ORTHO-CYCLEN) 0.25-35 MG-MCG tablet, Take 1 tablet by mouth daily., Disp: , Rfl:    ocrelizumab (OCREVUS) 300 MG/10ML injection, Inject 600 mg into the vein every 6 (six) months., Disp: , Rfl:    SUMAtriptan (IMITREX) 50 MG tablet, TAKE 1 TAB(S) ORALLY ONCE, CAN REPEAT 2 HOURS LATER IF NEEDED, Disp: , Rfl: 0   WEGOVY 0.25 MG/0.5ML SOAJ, , Disp: , Rfl:    escitalopram (LEXAPRO) 20 MG tablet, Take 1 tablet (20 mg total) by mouth daily., Disp: 90 tablet, Rfl: 3   phentermine 30 MG capsule, Take 1 capsule (30 mg total) by mouth every morning., Disp: 90 capsule, Rfl: 1  PAST MEDICAL HISTORY: Past Medical History:  Diagnosis Date   Anxiety    Depression    Gallstones 07/2014   GERD (gastroesophageal reflux disease)    no  current med.   Irritable bowel syndrome (IBS)    Migraines    Multiple sclerosis (HCC)    Sinus infection 08/11/2014   will be finished with antibiotic prior to surgery   Vision abnormalities     PAST SURGICAL HISTORY: Past Surgical History:  Procedure Laterality Date   CHOLECYSTECTOMY N/A 08/19/2014   Procedure: LAPAROSCOPIC CHOLECYSTECTOMY WITH INTRAOPERATIVE CHOLANGIOGRAM;  Surgeon: Harriette Bouillon, MD;  Location: [facility];  Service: General;  Laterality: N/A;   NO PAST SURGERIES      FAMILY HISTORY: Family History  Problem Relation Age of Onset   Hyperlipidemia Mother    Hyperlipidemia Father    Diabetes Father    Atrial fibrillation Father    Healthy Brother    Lung cancer Maternal Grandmother    Diabetes Paternal Grandmother    Cancer Paternal Grandfather        colon   Multiple sclerosis Neg Hx     SOCIAL HISTORY:  Social History   Socioeconomic History   Marital status: Single    Spouse name: Not on file   Number of children: Not on file   Years of education: Not on file   Highest education level: Not on file  Occupational History   Not on file  Tobacco Use   Smoking status: Never   Smokeless tobacco: Never  Vaping Use   Vaping status: Not on file  Substance and Sexual Activity   Alcohol use: No   Drug use: No   Sexual activity: Not on file  Other Topics Concern   Not on file  Social History Narrative   Pt lives alone    Pt doesn't work    Social Drivers of Corporate investment banker Strain: Not on file  Food Insecurity: Not on file  Transportation Needs: Not on file  Physical Activity: Not on file  Stress: Not on file  Social Connections: Not on file  Intimate Partner Violence: Not on file     PHYSICAL EXAM  Vitals:   01/03/24 0813  BP: 125/80  Pulse: 71  Weight: (!) 369 lb (167.4 kg)  Height: 5\' 10"  (1.778 m)    Body mass index is 52.95 kg/m.   General: The patient is well-developed and well-nourished and  in no acute distress  Neck: The neck is supple with good ROM  Skin: Extremities are without significant edema.  Neurologic Exam  Mental status: The patient is alert and oriented x 3 at the time of the examination. The patient has apparent normal recent and remote memory, with an apparently normal attention span and concentration ability.   Speech is normal.  Cranial nerves: Extraocular movements are full.  Facial strength and sensation was normal.  No dysarthria.Marland Kitchen  Hearing seems symmetric.  Motor:  Muscle bulk is normal.   Tone is normal. Strength is  5 / 5 in all 4 extremities except 4/5 right foot/tpe extensors..   Sensory: Sensory testing shows reduced vibration and temperature sensation in hte right arm and leg.   Touch was more sytmmetric.    Coordination: Cerebellar testing reveals good finger-nose-finger and reduced right heel-to-shin   Gait and station: Station is normal.   The gait was wide with a mild right foot drop  Tandem gait was wide.  Turn was unsteady needed to touch bench.. Romberg is negative.   Reflexes: Deep tendon reflexes are symmetric and normal bilaterally.       DIAGNOSTIC DATA (LABS, IMAGING, TESTING) - I reviewed patient records, labs, notes, testing and imaging myself where available.  Lab Results  Component Value Date   WBC 8.9 06/28/2023   HGB 12.4 06/28/2023   HCT 39.2 06/28/2023   MCV 89 06/28/2023   PLT 182 06/28/2023      Component Value Date/Time   NA 138 05/20/2022 1238   NA 139 03/09/2021 1552   K 3.7 05/20/2022 1238   CL 105 05/20/2022 1238   CO2 24 05/20/2022 1238   GLUCOSE 131 (H) 05/20/2022 1238   BUN 12 05/20/2022 1238   BUN 10 03/09/2021 1552   CREATININE 0.68 05/20/2022 1238   CALCIUM 8.7 (L) 05/20/2022 1238   PROT 6.9 03/09/2021 1552   ALBUMIN 4.4 03/09/2021 1552   AST 12 03/09/2021 1552   ALT 14 03/09/2021 1552   ALKPHOS 85 03/09/2021 1552   BILITOT 0.3 03/09/2021 1552   GFRNONAA >60 05/20/2022 1238   GFRAA >90  08/12/2014 1220      ASSESSMENT AND PLAN  Multiple sclerosis (HCC) - Plan: IgG, IgA, IgM, CBC with Differential/Platelet  Depression, unspecified depression type - Plan: escitalopram (LEXAPRO) 20 MG tablet  Obsessive-compulsive disorder, unspecified type - Plan: escitalopram (LEXAPRO) 20 MG tablet  Hoarding disorder - Plan: escitalopram (LEXAPRO) 20 MG tablet  High risk medication use - Plan: IgG, IgA, IgM, CBC with Differential/Platelet  1.  Continue Ocrevus.  CD20 counts were 0 at 5 1/2 months.   Recheck CBC/D and IgG and IgM.  We will check an MRI of the brain to determine if breakthrough and consider a DMT if occuring 2.  stay active and exercise as tolerated.  3.  Try increasing phentermine for EDS/fatigue and weight loss.  Could also consider a low dose Adderall or Ritalin 4.  Due to reduced gait/balance, she is unable to work in Engineering geologist.    She has a right foot drop 5.   Return  in 6 months or sooner if there are new or worsening neurologic symptoms.  Parris Cudworth A. Godwin Lat, MD, [facility] 01/03/2024, 8:57 AM Certified in Neurology, Clinical Neurophysiology, Sleep Medicine, Pain Medicine and Neuroimaging  [facility] [address], [address] [place], [place] [address] [phone]

## 2024-01-04 ENCOUNTER — Encounter: Payer: Self-pay | Admitting: Neurology

## 2024-01-04 ENCOUNTER — Telehealth: Payer: Self-pay | Admitting: Neurology

## 2024-01-04 LAB — CBC WITH DIFFERENTIAL/PLATELET
Basophils Absolute: 0 10*3/uL (ref 0.0–0.2)
Basos: 0 %
EOS (ABSOLUTE): 0.2 10*3/uL (ref 0.0–0.4)
Eos: 2 %
Hematocrit: 39.1 % (ref 34.0–46.6)
Hemoglobin: 12.5 g/dL (ref 11.1–15.9)
Immature Grans (Abs): 0.1 10*3/uL (ref 0.0–0.1)
Immature Granulocytes: 1 %
Lymphocytes Absolute: 2 10*3/uL (ref 0.7–3.1)
Lymphs: 22 %
MCH: 27.8 pg (ref 26.6–33.0)
MCHC: 32 g/dL (ref 31.5–35.7)
MCV: 87 fL (ref 79–97)
Monocytes Absolute: 0.5 10*3/uL (ref 0.1–0.9)
Monocytes: 6 %
Neutrophils Absolute: 6.4 10*3/uL (ref 1.4–7.0)
Neutrophils: 69 %
Platelets: 162 10*3/uL (ref 150–450)
RBC: 4.5 x10E6/uL (ref 3.77–5.28)
RDW: 13.5 % (ref 11.7–15.4)
WBC: 9.3 10*3/uL (ref 3.4–10.8)

## 2024-01-04 LAB — IGG, IGA, IGM
IgA/Immunoglobulin A, Serum: 267 mg/dL (ref 87–352)
IgG (Immunoglobin G), Serum: 1400 mg/dL (ref 586–1602)
IgM (Immunoglobulin M), Srm: 93 mg/dL (ref 26–217)

## 2024-01-04 NOTE — Telephone Encounter (Signed)
 [facility] [place]: [id number] exp. 01/04/24-03/03/24 sent to GI [phone]

## 2024-02-10 ENCOUNTER — Ambulatory Visit
Admission: RE | Admit: 2024-02-10 | Discharge: 2024-02-10 | Disposition: A | Discharge status: Home / Self Care | Source: Ambulatory Visit | Attending: Neurology | Admitting: Neurology

## 2024-02-10 DIAGNOSIS — G35 Multiple sclerosis: Secondary | ICD-10-CM | POA: Diagnosis not present

## 2024-02-10 DIAGNOSIS — R269 Unspecified abnormalities of gait and mobility: Secondary | ICD-10-CM | POA: Diagnosis not present

## 2024-02-10 MED ORDER — GADOPICLENOL 0.5 MMOL/ML IV SOLN
10.0000 mL | Freq: Once | INTRAVENOUS | Status: AC | PRN
Start: 2024-02-10 — End: 2024-02-10
  Administered 2024-02-10: 10 mL via INTRAVENOUS

## 2024-02-12 ENCOUNTER — Ambulatory Visit: Payer: Self-pay | Admitting: Neurology

## 2024-03-04 ENCOUNTER — Telehealth: Payer: Self-pay | Admitting: *Deleted

## 2024-03-04 NOTE — Telephone Encounter (Signed)
   Forms faxed to [phone], confirmation received,

## 2024-04-17 ENCOUNTER — Ambulatory Visit (INDEPENDENT_AMBULATORY_CARE_PROVIDER_SITE_OTHER): Admitting: Psychology

## 2024-04-17 DIAGNOSIS — F4310 Post-traumatic stress disorder, unspecified: Secondary | ICD-10-CM | POA: Diagnosis not present

## 2024-04-17 DIAGNOSIS — F423 Hoarding disorder: Secondary | ICD-10-CM | POA: Diagnosis not present

## 2024-04-17 NOTE — Progress Notes (Unsigned)
                Talar Fraley G Bluma Buresh, LCSW

## 2024-05-08 ENCOUNTER — Ambulatory Visit (INDEPENDENT_AMBULATORY_CARE_PROVIDER_SITE_OTHER): Admitting: Psychology

## 2024-05-08 DIAGNOSIS — F423 Hoarding disorder: Secondary | ICD-10-CM

## 2024-05-08 DIAGNOSIS — F4310 Post-traumatic stress disorder, unspecified: Secondary | ICD-10-CM | POA: Diagnosis not present

## 2024-05-08 NOTE — Progress Notes (Addendum)
 [facility] Counselor/Therapist Progress Note  Patient ID: Kristen Brown, MRN: [id number],    Date: 05/08/2024  Time Spent: 56 minutes  Time in:  8:05 Time out: 9:01  Treatment Type: Individual Therapy  Reported Symptoms: sadness, frustration, anxiety  Mental Status Exam: Appearance:  Casual     Behavior: Appropriate  Motor: Normal  Speech/Language:  Normal Rate  Affect: blunted  Mood: pleasant  Thought process: normal  Thought content:   WNL  Sensory/Perceptual disturbances:   WNL  Orientation: oriented to person, place, time/date, and situation  Attention: Good  Concentration: Good  Memory: WNL  Fund of knowledge:  Good  Insight:   Good  Judgment:  Good  Impulse Control: Good   Risk Assessment: Danger to Self:  No Self-injurious Behavior: No Danger to Others: No Duty to Warn:no Physical Aggression / Violence:No  Access to Firearms a concern: No  Gang Involvement:No   Subjective: The patient attended a face-to-face individual therapy session via video today.  The patient gave verbal consent for the video to be on Caregility and is aware of the limitations of telehealth. The patient was alone in her car alone and the therapist was in the office.  We started at the session today on the phone and ended up transferring over to the video because I was having difficulty with technology.  The patient presents as pleasant today.  The patient reports that she has been taking care of her grandmother and her father and she has noticed that she does not seem to have as many problems with her anxiety when she is taking care of someone else.  She reports that she has had panic attacks in the grocery store previously when she has had to walk around the store.  I do believe that some of her anxiety might be related to her fear of not being able to finish the task because of her MS.  We talked about how to move forward and I am going to work with her on learning techniques  from a behavioral perspective to manage her anxiety better and then I am going to encourage her to go to a therapist when I retire that can work with her on furthering the skills.  I also plan to do EMDR with her again around her anxiety around her MS.    Interventions:  Cognitive Behavioral therapy, EMDR, meditation and mindfulness, psychoeducation, Problem solving, interpersonal  Diagnosis:  PTSD Hoarding disorder  Plan of Care: Plan: Client Abilities/Strengths  Intelligent, ability for insight,  Client Treatment Preferences  Outpatient individual therapy  Client Statement of Needs  I went to see a Disability therapist and he suggested I get therapy for my PTSD  Treatment Level  Outpatient Individual therapy  Symptoms  Displays a significant decline in interest and engagement in activities.:  (Status:  improved). Displays significant psychological and/or physiological distress resulting from internal and  external clues that are reminiscent of the traumatic event.:(Status: maintained). Experiences disturbances in sleep.: (Status: improved). Experiences disturbing  and persistent thoughts, images, and/or perceptions of the traumatic event.:  (Status: improved). Experiences frequent nightmares.:  (Status: improved). Has  been exposed to a traumatic event involving actual or perceived threat of death or serious injury.: (Status: maintained). Impairment in social, occupational, or other areas of  functioning.:  (Status: improved). Intentionally avoids thoughts, feelings, or  discussions related to the traumatic event.: (Status: improved). Reports  response of intense fear, helplessness, or horror to the traumatic event.: (Status: improved). Symptoms present  more than one month.:(Status: maintained).  Goals 1. Eliminate or reduce the negative impact trauma related symptoms have  on social, occupational, and family functioning. 2. No longer experiences intrusive event recollections,  avoidance of event  reminders, intense arousal, or disinterest in activities or  relationships. Objective Verbalize any symptoms of depression, including any suicidal thoughts. Target Date:04/17/2025 Frequency: weekly to Biweekly Progress: 0 Modality: individual Objective Participate in Eye Movement Desensitization and Reprocessing (EMDR) to reduce emotional distress  related to traumatic thoughts, feelings, and images. Target Date: 04/17/2025 Frequency: Biweekly Progress: 60 Modality: individual Related Interventions 1. Utilize Eye Movement Desensitization and Reprocessing (EMDR) to reduce the client's  emotional reactivity to the traumatic event and reduce PTSD symptoms. Objective Learn and implement calming skills. Target Date: 04/18/2035 Frequency:weekly to Biweekly Progress: 0 Modality: individual Related Interventions 1. Teach the client calming skills (e.g., breathing retraining, relaxation, calming self-talk) to use in and between sessions when feeling overly distressed. Objective Participate in Cognitive Therapy to help identify, challenge, and replace biased, negative, and selfdefeating thoughts resulting from the trauma. Target Date: 04/17/2025 Frequency: weekly to Biweekly Progress: 0 Modality: individual Related Interventions 1. Using Cognitive Therapy techniques, explore the client's self-talk and beliefs about self, others,  and the future that are a consequence of the trauma (e.g., themes of safety, trust, power, control,  esteem, and intimacy); identify and challenge biases; assist him/her in generating appraisals that correct for the biases; test biased and alternatives predictions through behavioral experiments. Objective Learn and implement guided self-dialogue to manage thoughts, feelings, and urges brought on by  encounters with trauma-related situations. Target Date: 04/17/2025 Frequency: weekly to Biweekly Progress: 50 Modality: individual Related  Interventions 1. Teach the client a guided self-dialogue procedure in which he/she learns to recognize  maladaptive self-talk, challenges its biases, copes with engendered feelings, overcomes  avoidance, and reinforces his/her accomplishments; review and reinforce progress, problemsolve obstacles. Objective Learn and implement approaches for addressing shame and self-disparagement. Target Date: 04/17/2025 Frequency: weekly to Biweekly Progress: 0 Modality: individual Diagnosis F43.10 (Posttraumatic stress disorder) - Open -signifier: n/a posttraumatic Stress  Disorder  Panic Disorder Conditions For Discharge Achievement of treatment goals and objectives Patient is scheduled for her next session within the next two weeks. The patient approved this treatment plan.     Maanvi Lecompte G Ameria Sanjurjo, LCSW                    Zorana Brockwell G Marianne Golightly, LCSW

## 2024-05-21 ENCOUNTER — Encounter: Payer: Self-pay | Admitting: Adult Health

## 2024-05-22 NOTE — Telephone Encounter (Signed)
I have never seen this patient before

## 2024-06-06 ENCOUNTER — Ambulatory Visit (INDEPENDENT_AMBULATORY_CARE_PROVIDER_SITE_OTHER): Admitting: Psychology

## 2024-06-06 DIAGNOSIS — F423 Hoarding disorder: Secondary | ICD-10-CM

## 2024-06-06 DIAGNOSIS — F4310 Post-traumatic stress disorder, unspecified: Secondary | ICD-10-CM | POA: Diagnosis not present

## 2024-06-06 NOTE — Progress Notes (Signed)
 [facility] Counselor/Therapist Progress Note  Patient ID: Kristen Brown, MRN: [id number],    Date: 06/07/2024  Time Spent: 53 minutes  Time in:  9:07 Time out:10:04  Treatment Type: Individual Therapy  Reported Symptoms: sadness, frustration, anxiety  Mental Status Exam: Appearance:  Casual     Behavior: Appropriate  Motor: Normal  Speech/Language:  Normal Rate  Affect: blunted  Mood: pleasant  Thought process: normal  Thought content:   WNL  Sensory/Perceptual disturbances:   WNL  Orientation: oriented to person, place, time/date, and situation  Attention: Good  Concentration: Good  Memory: WNL  Fund of knowledge:  Good  Insight:   Good  Judgment:  Good  Impulse Control: Good   Risk Assessment: Danger to Self:  No Self-injurious Behavior: No Danger to Others: No Duty to Warn:no Physical Aggression / Violence:No  Access to Firearms a concern: No  Gang Involvement:No   Subjective: The patient attended a face-to-face individual therapy session via video today.  The patient gave verbal consent for the video to be on Caregility and is aware of the limitations of telehealth. The patient was alone in her car alone and the therapist was in the office.  The patient presents as pleasant and cooperative.  The patient reports that she called [facility] and they are no longer taking Medicaid.  She reports that she did not call anywhere else during the time I have not seen her.  We talked about other places she could contact to see if they are taking Medicaid.  In addition she is staying with her father and grandmother and I recommended that she consider checking with DSS and the Medicaid office to see if she could possibly be paid to help take care of her grandmother since she is doing that anyway.  The patient states that she has made some progress at her house with the help of her friend that she is dating.  I gave her some more resources and encouraged her to contact  those between now and her next session. Interventions:  Cognitive Behavioral therapy, EMDR, meditation and mindfulness, psychoeducation, Problem solving, interpersonal  Diagnosis:  PTSD Hoarding disorder  Plan of Care: Plan: Client Abilities/Strengths  Intelligent, ability for insight,  Client Treatment Preferences  Outpatient individual therapy  Client Statement of Needs  I went to see a Disability therapist and he suggested I get therapy for my PTSD  Treatment Level  Outpatient Individual therapy  Symptoms  Displays a significant decline in interest and engagement in activities.:  (Status:  improved). Displays significant psychological and/or physiological distress resulting from internal and  external clues that are reminiscent of the traumatic event.:(Status: maintained). Experiences disturbances in sleep.: (Status: improved). Experiences disturbing  and persistent thoughts, images, and/or perceptions of the traumatic event.:  (Status: improved). Experiences frequent nightmares.:  (Status: improved). Has  been exposed to a traumatic event involving actual or perceived threat of death or serious injury.: (Status: maintained). Impairment in social, occupational, or other areas of  functioning.:  (Status: improved). Intentionally avoids thoughts, feelings, or  discussions related to the traumatic event.: (Status: improved). Reports  response of intense fear, helplessness, or horror to the traumatic event.: (Status: improved). Symptoms present more than one month.:(Status: maintained).  Goals 1. Eliminate or reduce the negative impact trauma related symptoms have  on social, occupational, and family functioning. 2. No longer experiences intrusive event recollections, avoidance of event  reminders, intense arousal, or disinterest in activities or  relationships. Objective Verbalize any symptoms of  depression, including any suicidal thoughts. Target Date:04/17/2025 Frequency: weekly  to Biweekly Progress: 10 Modality: individual Objective Participate in Eye Movement Desensitization and Reprocessing (EMDR) to reduce emotional distress  related to traumatic thoughts, feelings, and images. Target Date: 04/17/2025 Frequency: Biweekly Progress: 60 Modality: individual Related Interventions 1. Utilize Eye Movement Desensitization and Reprocessing (EMDR) to reduce the client's  emotional reactivity to the traumatic event and reduce PTSD symptoms. Objective Learn and implement calming skills. Target Date: 04/18/2035 Frequency:weekly to Biweekly Progress: 0 Modality: individual Related Interventions 1. Teach the client calming skills (e.g., breathing retraining, relaxation, calming self-talk) to use in and between sessions when feeling overly distressed. Objective Participate in Cognitive Therapy to help identify, challenge, and replace biased, negative, and selfdefeating thoughts resulting from the trauma. Target Date: 04/17/2025 Frequency: weekly to Biweekly Progress: 0 Modality: individual Related Interventions 1. Using Cognitive Therapy techniques, explore the client's self-talk and beliefs about self, others,  and the future that are a consequence of the trauma (e.g., themes of safety, trust, power, control,  esteem, and intimacy); identify and challenge biases; assist him/her in generating appraisals that correct for the biases; test biased and alternatives predictions through behavioral experiments. Objective Learn and implement guided self-dialogue to manage thoughts, feelings, and urges brought on by  encounters with trauma-related situations. Target Date: 04/17/2025 Frequency: weekly to Biweekly Progress: 50 Modality: individual Related Interventions 1. Teach the client a guided self-dialogue procedure in which he/she learns to recognize  maladaptive self-talk, challenges its biases, copes with engendered feelings, overcomes  avoidance, and reinforces his/her  accomplishments; review and reinforce progress, problemsolve obstacles. Objective Learn and implement approaches for addressing shame and self-disparagement. Target Date: 04/17/2025 Frequency: weekly to Biweekly Progress: 0 Modality: individual Diagnosis F43.10 (Posttraumatic stress disorder) - Open -signifier: n/a posttraumatic Stress  Disorder  Panic Disorder Conditions For Discharge Achievement of treatment goals and objectives Patient is scheduled for her next session within the next two weeks. The patient approved this treatment plan.     Tylek Boney G Linzey Ramser, LCSW                    Kalissa Grays G Tyquez Hollibaugh, LCSW

## 2024-06-20 ENCOUNTER — Ambulatory Visit (INDEPENDENT_AMBULATORY_CARE_PROVIDER_SITE_OTHER): Admitting: Psychology

## 2024-06-20 DIAGNOSIS — F4310 Post-traumatic stress disorder, unspecified: Secondary | ICD-10-CM

## 2024-06-20 DIAGNOSIS — F423 Hoarding disorder: Secondary | ICD-10-CM | POA: Diagnosis not present

## 2024-06-20 NOTE — Progress Notes (Signed)
 [facility] Counselor/Therapist Progress Note  Patient ID: Kristen Brown, MRN: [id number],    Date: 06/20/2024  Time Spent: 53 minutes  Time in:  9:08 Time out:10:01  Treatment Type: Individual Therapy  Reported Symptoms: sadness, frustration, anxiety  Mental Status Exam: Appearance:  Casual     Behavior: Appropriate  Motor: Normal  Speech/Language:  Normal Rate  Affect: blunted  Mood: anxious  Thought process: normal  Thought content:   WNL  Sensory/Perceptual disturbances:   WNL  Orientation: oriented to person, place, time/date, and situation  Attention: Good  Concentration: Good  Memory: WNL  Fund of knowledge:  Good  Insight:   Good  Judgment:  Good  Impulse Control: Good   Risk Assessment: Danger to Self:  No Self-injurious Behavior: No Danger to Others: No Duty to Warn:no Physical Aggression / Violence:No  Access to Firearms a concern: No  Gang Involvement:No   Subjective: The patient attended a face-to-face individual therapy session via video today.  The patient gave verbal consent for the video to be on Caregility and is aware of the limitations of telehealth. The patient was alone in her car alone and the therapist was in the office.  The patient seems more anxious today.  We talked about what is going on and she states that she may have been worried more about money lately.  She is not getting any kind of assistance from anywhere really with her bills that she has.  She continues to stay at her father's house and help take care of him and also his mother.  She did call the Medicaid office to see if it is possible that she could get some compensation for staying down they are not looking after her grandmother as her grandmother is on [place].  We also talked about boundaries and limits and she is having a very difficult time expressing her needs to her father and asking for anything from him.  We talked about the need for us  to work on how to set  limits and take care of herself.  I also gave her some information on [facility] so that she can pursue a medication management doctor and therapy once I retire.    Interventions:  Cognitive Behavioral therapy, EMDR, meditation and mindfulness, psychoeducation, Problem solving, interpersonal  Diagnosis:  PTSD Hoarding disorder  Plan of Care: Plan: Client Abilities/Strengths  Intelligent, ability for insight,  Client Treatment Preferences  Outpatient individual therapy  Client Statement of Needs  I went to see a Disability therapist and he suggested I get therapy for my PTSD  Treatment Level  Outpatient Individual therapy  Symptoms  Displays a significant decline in interest and engagement in activities.:  (Status:  improved). Displays significant psychological and/or physiological distress resulting from internal and  external clues that are reminiscent of the traumatic event.:(Status: maintained). Experiences disturbances in sleep.: (Status: improved). Experiences disturbing  and persistent thoughts, images, and/or perceptions of the traumatic event.:  (Status: improved). Experiences frequent nightmares.:  (Status: improved). Has  been exposed to a traumatic event involving actual or perceived threat of death or serious injury.: (Status: maintained). Impairment in social, occupational, or other areas of  functioning.:  (Status: improved). Intentionally avoids thoughts, feelings, or  discussions related to the traumatic event.: (Status: improved). Reports  response of intense fear, helplessness, or horror to the traumatic event.: (Status: improved). Symptoms present more than one month.:(Status: maintained).  Goals 1. Eliminate or reduce the negative impact trauma related symptoms have  on  social, occupational, and family functioning. 2. No longer experiences intrusive event recollections, avoidance of event  reminders, intense arousal, or disinterest in activities or   relationships. Objective Verbalize any symptoms of depression, including any suicidal thoughts. Target Date:04/17/2025 Frequency: weekly to Biweekly Progress: 10 Modality: individual Objective Participate in Eye Movement Desensitization and Reprocessing (EMDR) to reduce emotional distress  related to traumatic thoughts, feelings, and images. Target Date: 04/17/2025 Frequency: Biweekly Progress: 60 Modality: individual Related Interventions 1. Utilize Eye Movement Desensitization and Reprocessing (EMDR) to reduce the client's  emotional reactivity to the traumatic event and reduce PTSD symptoms. Objective Learn and implement calming skills. Target Date: 04/17/2025 Frequency:weekly to Biweekly Progress: 0 Modality: individual Related Interventions 1. Teach the client calming skills (e.g., breathing retraining, relaxation, calming self-talk) to use in and between sessions when feeling overly distressed. Objective Participate in Cognitive Therapy to help identify, challenge, and replace biased, negative, and selfdefeating thoughts resulting from the trauma. Target Date: 04/17/2025 Frequency: weekly to Biweekly Progress: 0 Modality: individual Related Interventions 1. Using Cognitive Therapy techniques, explore the client's self-talk and beliefs about self, others,  and the future that are a consequence of the trauma (e.g., themes of safety, trust, power, control,  esteem, and intimacy); identify and challenge biases; assist him/her in generating appraisals that correct for the biases; test biased and alternatives predictions through behavioral experiments. Objective Learn and implement guided self-dialogue to manage thoughts, feelings, and urges brought on by  encounters with trauma-related situations. Target Date: 04/17/2025 Frequency: weekly to Biweekly Progress: 50 Modality: individual Related Interventions 1. Teach the client a guided self-dialogue procedure in which he/she  learns to recognize  maladaptive self-talk, challenges its biases, copes with engendered feelings, overcomes  avoidance, and reinforces his/her accomplishments; review and reinforce progress, problemsolve obstacles. Objective Learn and implement approaches for addressing shame and self-disparagement. Target Date: 04/17/2025 Frequency: weekly to Biweekly Progress: 0 Modality: individual Diagnosis F43.10 (Posttraumatic stress disorder) - Open -signifier: n/a posttraumatic Stress  Disorder  Panic Disorder Conditions For Discharge Achievement of treatment goals and objectives Patient is scheduled for her next session within the next two weeks. The patient approved this treatment plan.     Navah Grondin G Kolter Reaver, LCSW                    Janyce Ellinger G Adela Esteban, LCSW

## 2024-06-25 ENCOUNTER — Ambulatory Visit: Admitting: Adult Health

## 2024-06-26 ENCOUNTER — Encounter: Payer: Self-pay | Admitting: Adult Health

## 2024-06-26 ENCOUNTER — Ambulatory Visit: Admitting: Adult Health

## 2024-06-26 VITALS — BP 138/66 | HR 71 | Ht 71.0 in | Wt 362.0 lb

## 2024-06-26 DIAGNOSIS — R269 Unspecified abnormalities of gait and mobility: Secondary | ICD-10-CM | POA: Diagnosis not present

## 2024-06-26 DIAGNOSIS — R252 Cramp and spasm: Secondary | ICD-10-CM

## 2024-06-26 DIAGNOSIS — F32A Depression, unspecified: Secondary | ICD-10-CM

## 2024-06-26 DIAGNOSIS — Z79899 Other long term (current) drug therapy: Secondary | ICD-10-CM | POA: Diagnosis not present

## 2024-06-26 DIAGNOSIS — G4719 Other hypersomnia: Secondary | ICD-10-CM

## 2024-06-26 DIAGNOSIS — G35D Multiple sclerosis, unspecified: Secondary | ICD-10-CM

## 2024-06-26 MED ORDER — SUMATRIPTAN SUCCINATE 50 MG PO TABS: 50.0000 mg | ORAL_TABLET | ORAL | 11 refills | Status: AC | PRN

## 2024-06-26 MED ORDER — ESCITALOPRAM OXALATE 20 MG PO TABS: 20.0000 mg | ORAL_TABLET | Freq: Every day | ORAL | 3 refills | Status: DC

## 2024-06-26 MED ORDER — GABAPENTIN 300 MG PO CAPS: 300.0000 mg | ORAL_CAPSULE | Freq: Three times a day (TID) | ORAL | 11 refills | Status: AC

## 2024-06-26 MED ORDER — BACLOFEN 10 MG PO TABS: 10.0000 mg | ORAL_TABLET | Freq: Three times a day (TID) | ORAL | 11 refills | Status: AC | PRN

## 2024-06-26 MED ORDER — PHENTERMINE HCL 30 MG PO CAPS: 30.0000 mg | ORAL_CAPSULE | ORAL | 1 refills | Status: DC

## 2024-06-26 NOTE — Progress Notes (Signed)
 [facility]  PATIENT: Kristen Brown DOB: [birth year]  REFERRING DOCTOR OR PCP:  Olam Pinal, MD SOURCE: patient, notes from Dr. Pinal and Dr. Jeris, imaging and lab reports, MRI images on PACS  _________________________________   HISTORICAL  CHIEF COMPLAINT:  Chief Complaint  Patient presents with   Multiple Sclerosis    Rm 3 alone Pt is well, reports recent fall and pain in legs. Otherwise she is stable, no other concerns.    Update 06/26/2024 JM: Returns for follow-up visit unaccompanied after prior visit with Dr. Vear 01/03/2024.  She remains on Ocrevus infusion, switched to Ocrevus in 2022, next infusion 07/01/2024.  She does experience bilateral ear itching and throat tightness about halfway through infusion, will receive IV Benadryl with resolution of symptoms and able to complete infusion.  The CD19/CD20 count was 0 (11/2022) showing excellent longevity of the Ocrevus dose.  MRI brain 01/2024 showed no new MS lesions.  Her main concern is gradual worsening of pain in her lower extremities over the past couple of months.  More of a stiffness/tightness sensation primarily in her thighs, shins/calfs and ankles.  Has been interfering with her sleep as it difficult to get comfortable.  After sitting for prolonged period of time or driving, she will need to stand and stretch as her legs will initially quiver.  She has continued on gabapentin  300 mg 3 times daily.  She still has right sided paresthesias which sometimes can be bothersome but gabapentin  helps.  Mildly reduced gait due to right foot drop.  Stairs can be difficult.  She did have a recent fall but thankfully no significant injury.  Ambulates without AD.  She experiences excessive daytime fatigue.  Previously on phentermine  30 mg daily with great benefit but has not been able to get a refill for unclear reason.  She has been trying to work on weight loss and was doing well until her father had a bad  reaction to a vaccination and was in the hospital for 3 months, he is still recovering, she has been caring for him as well as her 23 year old grandmother with dementia.  She has been gradually getting back on track with her weight loss goals.  Mood overall stable.  Remains on Lexapro , Abilify  and duloxetine.  She is unable to work due to gait impairment, RLE weakness with foot drop and excessive fatigue.  Reports she was denied for disability but plans on reapplying.  Migraines overall stable, can fluctuate, typically has about 4 to 5/month.  Uses sumatriptan with benefit but has not been able to get a refill.      MS HISTORY: She was diagnosed with MS in November 2014.    In early 2014,, she had numbness in her legs that was ust present one morning.  It improved over a few weeks and she did not see any doctors.    Later that year, she had diplopia that was preent one morning.   Symptoms took 6 months to mostly recover.   She feels worse if very tired or hot.   She had an MRi of the brain showing multiple lesions c/with MS.   3 lesions enhanced.    She was referred to Dr Abou Zeid at [facility].   She started Tecfidera .    She needed to titrate slow;y due to GI upset when she tried a higher dose more rapidly.    She no longer has GI issues but has occasional flushing still that responds to aspirin.  Since starting the medication she did not had definite exacerbation until possibly earlier this year.   She had an exacerbation 01/2021 and MRI showed multipke enhancing lesions.   She was placed on Ocrevus in mid 2022.SABRA      MRI of the brain 05/11/2018 showed 1 or 2 more foci that were not present compared to the 2014 MRI including one in the middle cerebellar peduncle.  MRI of the cervical spine shows 1 probable MS focus.  MRI [facility] 03/06/21 showed multiple T2/FLAIR hyperintense foci in the cerebral hemispheres, thalamus, brainstem and left middle cerebellar peduncle in a pattern consistent with  demyelinating plaque associated with multiple sclerosis.  5 foci in the cerebral hemispheres enhance after gadolinium contrast consistent with acute demyelinating plaque.  Several nonenhancing foci in the hemispheres, 2 foci in the thalamus and 1 in the midbrain are present on the current MRI but were not present on the 2019 MRI.  MRI cervical spine 03/06/2021 showed multiple T2 hyperintense foci within the spinal cord in a pattern consistent with demyelination associated with multiple sclerosis.  All but one of the foci are new compared to the 2019 MRI..  3 foci at C4-C5 and C7 enhance after contrast.  This is consistent with acute demyelinating plaques.  MRI of the brain 02/09/2022 showed T2/FLAIR hyperintense foci in the cerebral hemispheres, thalamus, brainstem and left middle cerebellar peduncle in a pattern consistent with chronic demyelinating plaque associated with multiple sclerosis.  None of the foci appear to be acute.  They do not enhance.  Compared to the MRI from 03/06/2021, there were no new lesions.  MRI of the brain 02/10/2024 showed multiple T2/FLAIR hyperintense foci in the cerebral hemispheres and brainstem in a pattern consistent with chronic migrating plaque associated with multiple sclerosis. None of the foci enhance or appear to be acute. Compared to the MRI from 02/09/2022, there are no new lesions.    REVIEW OF SYSTEMS: Constitutional: No fevers, chills, sweats, or change in appetite.   She has fatigue. Eyes: No visual changes, double vision, eye pain Ear, nose and throat: No hearing loss, ear pain, nasal congestion, sore throat Cardiovascular: No chest pain, palpitations Respiratory:  No shortness of breath at rest or with exertion.   Mild OSA GastrointestinaI: No nausea, vomiting, diarrhea, abdominal pain, fecal incontinence Genitourinary:  No dysuria, urinary retention or frequency.  No nocturia. Musculoskeletal:  No neck pain, back pain Integumentary: No rash, pruritus,  skin lesions Neurological: as above Psychiatric: No depression at this time.  No anxiety Endocrine: No palpitations, diaphoresis, change in appetite, change in weigh or increased thirst Hematologic/Lymphatic:  No anemia, purpura, petechiae. Allergic/Immunologic: No itchy/runny eyes, nasal congestion, recent allergic reactions, rashes  ALLERGIES: Allergies  Allergen Reactions   Sulfa Antibiotics Hives   Adhesive [Tape] Rash   Latex Rash    HOME MEDICATIONS:  Current Outpatient Medications:    ARIPiprazole  (ABILIFY ) 5 MG tablet, Take 5 mg by mouth daily., Disp: , Rfl:    baclofen (LIORESAL) 10 MG tablet, Take 1 tablet (10 mg total) by mouth 3 (three) times daily as needed for muscle spasms., Disp: 90 each, Rfl: 11   Cholecalciferol (VITAMIN D3) 125 MCG (5000 UT) TABS, Take 1 tablet by mouth daily., Disp: , Rfl:    diclofenac  (VOLTAREN ) 75 MG EC tablet, Take 1 tablet (75 mg total) by mouth 2 (two) times daily., Disp: 30 tablet, Rfl: 2   DULoxetine (CYMBALTA) 60 MG capsule, Take 60 mg by mouth daily., Disp: , Rfl:  escitalopram  (LEXAPRO ) 20 MG tablet, Take 1 tablet (20 mg total) by mouth daily., Disp: 90 tablet, Rfl: 3   gabapentin  (NEURONTIN ) 300 MG capsule, TAKE 1 CAPSULE BY MOUTH THREE TIMES A DAY, Disp: 90 capsule, Rfl: 11   loratadine (CLARITIN) 10 MG tablet, Take 10 mg by mouth daily., Disp: , Rfl:    LORazepam (ATIVAN) 0.5 MG tablet, Take 0.5 mg by mouth every 8 (eight) hours., Disp: , Rfl:    magnesium gluconate (MAGONATE) 500 MG tablet, Take 250 mg by mouth daily., Disp: , Rfl:    ocrelizumab (OCREVUS) 300 MG/10ML injection, Inject 600 mg into the vein every 6 (six) months., Disp: , Rfl:    phentermine  30 MG capsule, Take 1 capsule (30 mg total) by mouth every morning., Disp: 90 capsule, Rfl: 1   SUMAtriptan (IMITREX) 50 MG tablet, TAKE 1 TAB(S) ORALLY ONCE, CAN REPEAT 2 HOURS LATER IF NEEDED, Disp: , Rfl: 0  PAST MEDICAL HISTORY: Past Medical History:  Diagnosis Date    Anxiety    Depression    Gallstones 07/2014   GERD (gastroesophageal reflux disease)    no current med.   Irritable bowel syndrome (IBS)    Migraines    Multiple sclerosis    Sinus infection 08/11/2014   will be finished with antibiotic prior to surgery   Vision abnormalities     PAST SURGICAL HISTORY: Past Surgical History:  Procedure Laterality Date   CHOLECYSTECTOMY N/A 08/19/2014   Procedure: LAPAROSCOPIC CHOLECYSTECTOMY WITH INTRAOPERATIVE CHOLANGIOGRAM;  Surgeon: Debby Shipper, MD;  Location: [facility];  Service: General;  Laterality: N/A;   NO PAST SURGERIES      FAMILY HISTORY: Family History  Problem Relation Age of Onset   Hyperlipidemia Mother    Hyperlipidemia Father    Diabetes Father    Atrial fibrillation Father    Healthy Brother    Lung cancer Maternal Grandmother    Diabetes Paternal Grandmother    Cancer Paternal Grandfather        colon   Multiple sclerosis Neg Hx     SOCIAL HISTORY:  Social History   Socioeconomic History   Marital status: Single    Spouse name: Not on file   Number of children: Not on file   Years of education: Not on file   Highest education level: Not on file  Occupational History   Not on file  Tobacco Use   Smoking status: Never   Smokeless tobacco: Never  Vaping Use   Vaping status: Not on file  Substance and Sexual Activity   Alcohol use: No   Drug use: No   Sexual activity: Not on file  Other Topics Concern   Not on file  Social History Narrative   Pt lives alone    Pt doesn't work    Social Drivers of Corporate investment banker Strain: Not on file  Food Insecurity: Not on file  Transportation Needs: Not on file  Physical Activity: Not on file  Stress: Not on file  Social Connections: Not on file  Intimate Partner Violence: Not on file     PHYSICAL EXAM  Vitals:   06/26/24 0802  BP: 138/66  Pulse: 71  Weight: (!) 362 lb (164.2 kg)  Height: 5' 11 (1.803 m)   Body mass  index is 50.49 kg/m.  General: The patient is very pleasant middle-age Caucasian female, well-developed and well-nourished and in no acute distress  Neck: The neck is supple with good ROM  Skin: Extremities  are without significant edema.  Neurologic Exam  Mental status: The patient is alert and oriented x 3 at the time of the examination. The patient has apparent normal recent and remote memory, with an apparently normal attention span and concentration ability.   Speech is normal.  Cranial nerves: Extraocular movements are full.  Facial strength and sensation was normal.  No dysarthria.SABRA  Hearing seems symmetric.  Motor:  Muscle bulk is normal.   Tone is normal. Strength is  5 / 5 in all 4 extremities except 4/5 right foot/tpe extensors.  Sensory: Sensory testing shows reduced vibration and temperature sensation in hte right arm and leg.  Right upper and lower allodynia   Coordination: Cerebellar testing reveals good finger-nose-finger and reduced right heel-to-shin   Gait and station: Station is normal.   The gait was wide with a mild right foot drop  Tandem gait was wide.  Mild difficulty with turns.  No use of AD.       DIAGNOSTIC DATA (LABS, IMAGING, TESTING) - I reviewed patient records, labs, notes, testing and imaging myself where available.  Lab Results  Component Value Date   WBC 9.3 01/03/2024   HGB 12.5 01/03/2024   HCT 39.1 01/03/2024   MCV 87 01/03/2024   PLT 162 01/03/2024      Component Value Date/Time   NA 138 05/20/2022 1238   NA 139 03/09/2021 1552   K 3.7 05/20/2022 1238   CL 105 05/20/2022 1238   CO2 24 05/20/2022 1238   GLUCOSE 131 (H) 05/20/2022 1238   BUN 12 05/20/2022 1238   BUN 10 03/09/2021 1552   CREATININE 0.68 05/20/2022 1238   CALCIUM 8.7 (L) 05/20/2022 1238   PROT 6.9 03/09/2021 1552   ALBUMIN 4.4 03/09/2021 1552   AST 12 03/09/2021 1552   ALT 14 03/09/2021 1552   ALKPHOS 85 03/09/2021 1552   BILITOT 0.3 03/09/2021 1552    GFRNONAA >60 05/20/2022 1238   GFRAA >90 08/12/2014 1220      ASSESSMENT AND PLAN  Multiple sclerosis - Plan: baclofen (LIORESAL) 10 MG tablet, CBC with Differential/Platelets, IgG, IgA, IgM  High risk medication use - Plan: baclofen (LIORESAL) 10 MG tablet, CBC with Differential/Platelets, IgG, IgA, IgM  Spasticity - Plan: baclofen (LIORESAL) 10 MG tablet  Depression, unspecified depression type  Gait disturbance  Excessive daytime sleepiness  1.  Continue Ocrevus. Does have ear itching and throat tightness half way through infusion. Discussed with Dr. Vear, continue to pretreat with Benadryl 50 mg and if this occurs again, will consider switching to Briumvi.  CD20 counts were 0 11/2022.   Recheck CBC/D and IgG and IgM. Plan on repeat imaging spring of 2026 for surveillance monitoring. 2.  Start baclofen 10 mg 3 times daily as needed for increase spasticity and lower extremity pain, discussed potential side effects 3. Continue gabapentin  300 mg 3 times daily  4. Restart phentermine  30 mg daily for EDS/fatigue. Can also assist with weight loss 5.  Continue sumatriptan as needed for migraine rescue 6.  Continue Lexapro  20 mg daily for mood 7.  Due to reduced gait/balance, right foot drop and fatigue, she is unable to work in retail.  8.   Return in 6 months or sooner if there are new or worsening neurologic symptoms.    I personally spent a total of 50 minutes in the care of the patient today including preparing to see the patient, getting/reviewing separately obtained history, performing a medically appropriate exam/evaluation, counseling and educating, placing orders,  referring and communicating with other health care professionals, and documenting clinical information in the EHR.  Harlene Bogaert, AGNP-BC  [facility] [address] [place], [place] [address]  Phone [phone] Fax [phone] Note: This document was prepared with digital  dictation and possible smart phrase technology. Any transcriptional errors that result from this process are unintentional.

## 2024-06-26 NOTE — Patient Instructions (Addendum)
 Your Plan:  Start baclofen 10mg  three times daily as needed   Continue gabapentin  300mg  three times daily   Restart phentermine  30 mg daily  Continue sumatriptan as needed for migraine rescue  Continue Lexapro  20 mg daily for mood  Continue with Ocrevus infusion - will continue to pretreat with Benadryl 50 mg, if reaction reoccurs, will discuss switching to Briumvi    Follow-up in 6 months or call earlier if needed      Thank you for coming to see us  at [facility]. I hope we have been able to provide you high quality care today.  You may receive a patient satisfaction survey over the next few weeks. We would appreciate your feedback and comments so that we may continue to improve ourselves and the health of our patients.

## 2024-06-27 ENCOUNTER — Ambulatory Visit: Payer: Self-pay | Admitting: Adult Health

## 2024-06-27 LAB — CBC WITH DIFFERENTIAL/PLATELET
Basophils Absolute: 0.1 x10E3/uL (ref 0.0–0.2)
Basos: 1 %
EOS (ABSOLUTE): 0.2 x10E3/uL (ref 0.0–0.4)
Eos: 2 %
Hematocrit: 40.8 % (ref 34.0–46.6)
Hemoglobin: 12.8 g/dL (ref 11.1–15.9)
Immature Grans (Abs): 0.1 x10E3/uL (ref 0.0–0.1)
Immature Granulocytes: 1 %
Lymphocytes Absolute: 2.1 x10E3/uL (ref 0.7–3.1)
Lymphs: 22 %
MCH: 28.3 pg (ref 26.6–33.0)
MCHC: 31.4 g/dL — ABNORMAL LOW (ref 31.5–35.7)
MCV: 90 fL (ref 79–97)
Monocytes Absolute: 0.6 x10E3/uL (ref 0.1–0.9)
Monocytes: 6 %
Neutrophils Absolute: 6.4 x10E3/uL (ref 1.4–7.0)
Neutrophils: 68 %
Platelets: 196 x10E3/uL (ref 150–450)
RBC: 4.53 x10E6/uL (ref 3.77–5.28)
RDW: 13.6 % (ref 11.7–15.4)
WBC: 9.4 x10E3/uL (ref 3.4–10.8)

## 2024-06-27 LAB — IGG, IGA, IGM
IgA/Immunoglobulin A, Serum: 289 mg/dL (ref 87–352)
IgG (Immunoglobin G), Serum: 1078 mg/dL (ref 586–1602)
IgM (Immunoglobulin M), Srm: 91 mg/dL (ref 26–217)

## 2024-07-01 ENCOUNTER — Ambulatory Visit: Admitting: Adult Health

## 2024-07-03 ENCOUNTER — Ambulatory Visit (INDEPENDENT_AMBULATORY_CARE_PROVIDER_SITE_OTHER): Admitting: Psychology

## 2024-07-03 DIAGNOSIS — F423 Hoarding disorder: Secondary | ICD-10-CM

## 2024-07-03 DIAGNOSIS — F4310 Post-traumatic stress disorder, unspecified: Secondary | ICD-10-CM

## 2024-07-03 NOTE — Progress Notes (Unsigned)
 [facility] Counselor/Therapist Progress Note  Patient ID: TERI LEGACY, MRN: [id number],    Date: 07/03/2024  Time Spent: 53 minutes  Time in:  8:07 Time out:9:00  Treatment Type: Individual Therapy  Reported Symptoms: sadness, frustration, anxiety  Mental Status Exam: Appearance:  Casual     Behavior: Appropriate  Motor: Normal  Speech/Language:  Normal Rate  Affect: blunted  Mood: anxious  Thought process: normal  Thought content:   WNL  Sensory/Perceptual disturbances:   WNL  Orientation: oriented to person, place, time/date, and situation  Attention: Good  Concentration: Good  Memory: WNL  Fund of knowledge:  Good  Insight:   Good  Judgment:  Good  Impulse Control: Good   Risk Assessment: Danger to Self:  No Self-injurious Behavior: No Danger to Others: No Duty to Warn:no Physical Aggression / Violence:No  Access to Firearms a concern: No  Gang Involvement:No   Subjective: The patient attended a face-to-face individual therapy session via video today.  The patient gave verbal consent for the video to be on Caregility and is aware of the limitations of telehealth. The patient was alone in her car alone and the therapist was in the office.  The patient presents as somewhat anxious today.  She reports that she is doing pretty well right now.  I think that it is good for her to be staying with her father and grandmother right now because she does not seem to be hoarding as much and she is not as difficult and hard on herself.  We talked about her having called the Medicaid office but she has not received a bill yet at that address so that she can pursue funding through Medicaid for her grandmother.  In addition we talked about her way of communicating more with her father that she might need to be compensated in someway for assisting her grandmother.  In addition we talked about her calling a lawyer so that she can pursue SSDI possibly.  In addition we  talked about her following up on some other things where she can potentially get some financial assistance.  We did make a referral to [facility] outpatient behavioral health for medication management and therapy after I retire.  Interventions:  Cognitive Behavioral therapy, EMDR, meditation and mindfulness, psychoeducation, Problem solving, interpersonal  Diagnosis:  PTSD Hoarding disorder  Plan of Care: Plan: Client Abilities/Strengths  Intelligent, ability for insight,  Client Treatment Preferences  Outpatient individual therapy  Client Statement of Needs  I went to see a Disability therapist and he suggested I get therapy for my PTSD  Treatment Level  Outpatient Individual therapy  Symptoms  Displays a significant decline in interest and engagement in activities.:  (Status:  improved). Displays significant psychological and/or physiological distress resulting from internal and  external clues that are reminiscent of the traumatic event.:(Status: maintained). Experiences disturbances in sleep.: (Status: improved). Experiences disturbing  and persistent thoughts, images, and/or perceptions of the traumatic event.:  (Status: improved). Experiences frequent nightmares.:  (Status: improved). Has  been exposed to a traumatic event involving actual or perceived threat of death or serious injury.: (Status: maintained). Impairment in social, occupational, or other areas of  functioning.:  (Status: improved). Intentionally avoids thoughts, feelings, or  discussions related to the traumatic event.: (Status: improved). Reports  response of intense fear, helplessness, or horror to the traumatic event.: (Status: improved). Symptoms present more than one month.:(Status: maintained).  Goals 1. Eliminate or reduce the negative impact trauma related symptoms have  on social,  occupational, and family functioning. 2. No longer experiences intrusive event recollections, avoidance of event  reminders,  intense arousal, or disinterest in activities or  relationships. Objective Verbalize any symptoms of depression, including any suicidal thoughts. Target Date:04/17/2025 Frequency: weekly to Biweekly Progress: 20 Modality: individual Objective Participate in Eye Movement Desensitization and Reprocessing (EMDR) to reduce emotional distress  related to traumatic thoughts, feelings, and images. Target Date: 04/17/2025 Frequency: Biweekly Progress: 60 Modality: individual Related Interventions 1. Utilize Eye Movement Desensitization and Reprocessing (EMDR) to reduce the client's  emotional reactivity to the traumatic event and reduce PTSD symptoms. Objective Learn and implement calming skills. Target Date: 04/17/2025 Frequency:weekly to Biweekly Progress: 0 Modality: individual Related Interventions 1. Teach the client calming skills (e.g., breathing retraining, relaxation, calming self-talk) to use in and between sessions when feeling overly distressed. Objective Participate in Cognitive Therapy to help identify, challenge, and replace biased, negative, and selfdefeating thoughts resulting from the trauma. Target Date: 04/17/2025 Frequency: weekly to Biweekly Progress: 0 Modality: individual Related Interventions 1. Using Cognitive Therapy techniques, explore the client's self-talk and beliefs about self, others,  and the future that are a consequence of the trauma (e.g., themes of safety, trust, power, control,  esteem, and intimacy); identify and challenge biases; assist him/her in generating appraisals that correct for the biases; test biased and alternatives predictions through behavioral experiments. Objective Learn and implement guided self-dialogue to manage thoughts, feelings, and urges brought on by  encounters with trauma-related situations. Target Date: 04/17/2025 Frequency: weekly to Biweekly Progress: 50 Modality: individual Related Interventions 1. Teach the client a  guided self-dialogue procedure in which he/she learns to recognize  maladaptive self-talk, challenges its biases, copes with engendered feelings, overcomes  avoidance, and reinforces his/her accomplishments; review and reinforce progress, problemsolve obstacles. Objective Learn and implement approaches for addressing shame and self-disparagement. Target Date: 04/17/2025 Frequency: weekly to Biweekly Progress: 0 Modality: individual Diagnosis F43.10 (Posttraumatic stress disorder) - Open -signifier: n/a posttraumatic Stress  Disorder  Panic Disorder Conditions For Discharge Achievement of treatment goals and objectives Patient is scheduled for her next session within the next two weeks. The patient approved this treatment plan.     Sarabelle Genson G Moira Umholtz, LCSW

## 2024-07-17 ENCOUNTER — Ambulatory Visit (INDEPENDENT_AMBULATORY_CARE_PROVIDER_SITE_OTHER): Admitting: Psychology

## 2024-07-17 DIAGNOSIS — F4310 Post-traumatic stress disorder, unspecified: Secondary | ICD-10-CM

## 2024-07-17 DIAGNOSIS — F423 Hoarding disorder: Secondary | ICD-10-CM

## 2024-07-17 NOTE — Progress Notes (Unsigned)
                edge,  experiencing concentration difficulties, having trouble falling or staying asleep, exhibiting a general  state of irritability).: No Description Entered (Status: improved). Motor tension (e.g., restlessness,  tiredness, shakiness, muscle tension).: No Description Entered (Status: improved).  Problems Addressed  Anxiety, Phase Of Life Problems, Anxiety  Goals 1. Learn and implement coping skills that result in a reduction of anxiety  and worry, and improved daily functioning. Objective Learn  and implement calming skills to reduce overall anxiety and manage anxiety symptoms. Target Date: 2025-08-09Frequency: Weekly Progress: 40 Modality: individual  Related Interventions 1. Teach the client calming/relaxation skills (e.g., applied relaxation, progressive muscle  relaxation, cue controlled relaxation; mindful breathing; biofeedback) and how to discriminate  better between relaxation and tension; teach the client how to apply these skills to his/her daily  life (e.g., New Directions in Progressive Muscle Relaxation by Marcelyn Ditty, and  Hazlett-Stevens; Treating Generalized Anxiety Disorder by Rygh and Ida Rogue). Objective Identify, challenge, and replace biased, fearful self-talk with positive, realistic, and empowering selftalk. Target Date: 2024-04-27 Frequency: weekly Progress: 30 Modality: individual Related Interventions 1. Explore the client's schema and self-talk that mediate his/her fear response; assist him/her in  challenging the biases; replace the distorted messages with reality-based alternatives and  positive, realistic self-talk that will increase his/her self-confidence in coping with irrational  fears (see Cognitive Therapy of Anxiety Disorders by Laurence Slate). Objective Learn and implement problem-solving strategies for realistically addressing worries. Target Date: 2025-08-09Frequency: weekly Progress: 40 Modality: individual 2. Resolve conflicted feelings and adapt to the new life circumstances. Objective Apply problem-solving skills to current circumstances. Target Date: 2024-04-27 Frequency: weekly Progress: 20 Modality: individual Related Interventions 1. Teach the client problem-resolution skills (e.g., defining the problem clearly, brainstorming  multiple solutions, listing the pros and cons of each solution, seeking input from others,  selecting and implementing a plan of action, evaluating outcome, and readjusting plan as   necessary).   3. Stabilize anxiety level while increasing ability to function on a daily  basis. 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  The patient approved this plan.   Deonna Krummel G Ethridge Sollenberger, LCSW

## 2024-07-28 ENCOUNTER — Encounter: Payer: Self-pay | Admitting: Adult Health

## 2024-07-29 NOTE — Telephone Encounter (Signed)
 Sent message to [facility] to see if they have spoken with pt today, waiting on response

## 2024-07-31 ENCOUNTER — Ambulatory Visit (INDEPENDENT_AMBULATORY_CARE_PROVIDER_SITE_OTHER): Admitting: Psychology

## 2024-07-31 DIAGNOSIS — F423 Hoarding disorder: Secondary | ICD-10-CM

## 2024-07-31 DIAGNOSIS — F4310 Post-traumatic stress disorder, unspecified: Secondary | ICD-10-CM | POA: Diagnosis not present

## 2024-07-31 NOTE — Progress Notes (Signed)
 [facility] Counselor/Therapist Progress Note  Patient ID: Kristen Brown, MRN: [id number],    Date: 07/30/2024  Time Spent: 64 minutes  Time in:  8:11 Time out:9:15  Treatment Type: Individual Therapy  Reported Symptoms: sadness, frustration, anxiety  Mental Status Exam: Appearance:  Casual     Behavior: Appropriate  Motor: Normal  Speech/Language:  Normal Rate  Affect: blunted  Mood: pleasant  Thought process: normal  Thought content:   WNL  Sensory/Perceptual disturbances:   WNL  Orientation: oriented to person, place, time/date, and situation  Attention: Good  Concentration: Good  Memory: WNL  Fund of knowledge:  Good  Insight:   Good  Judgment:  Good  Impulse Control: Good   Risk Assessment: Danger to Self:  No Self-injurious Behavior: No Danger to Others: No Duty to Warn:no Physical Aggression / Violence:No  Access to Firearms a concern: No  Gang Involvement:No   Subjective: The patient attended a face-to-face individual therapy session via video today.  The patient gave verbal consent for the video to be on [facility] and is aware of the limitations of telehealth. The patient was alone in her car alone and the therapist was in the office.  The patient presents as pleasant and cooperative.  The patient reports that she has had a rough 2 weeks.  She states that everything she has tried to do has been at the task.  We talked about the progress that she is making and it does seem that she is making some progress.  She still has not mentioned to her father that she needs him to give her money to help take care of her  grandmother.  We practiced different ways to say something to her father so that he could possibly pick up that she needs to have some money given her way.  The patient seems to be making some headway with the appointments she is making and it seems that she has an appointment in February or early March with a new therapist at [facility] behavioral  [facility].  I am happy to see her until that appointment.  She she does have an appointment for medication management in the coming weeks.  Interventions:  Cognitive Behavioral therapy, EMDR, meditation and mindfulness, psychoeducation, Problem solving, interpersonal  Diagnosis:  PTSD Hoarding disorder  Plan of Care: Plan: Client Abilities/Strengths  Intelligent, ability for insight,  Client Treatment Preferences  Outpatient individual therapy  Client Statement of Needs  I went to see a Disability therapist and he suggested I get therapy for my PTSD  Treatment Level  Outpatient Individual therapy  Symptoms  Displays a significant decline in interest and engagement in activities.:  (Status:  improved). Displays significant psychological and/or physiological distress resulting from internal and  external clues that are reminiscent of the traumatic event.:(Status: maintained). Experiences disturbances in sleep.: (Status: improved). Experiences disturbing  and persistent thoughts, images, and/or perceptions of the traumatic event.:  (Status: improved). Experiences frequent nightmares.:  (Status: improved). Has  been exposed to a traumatic event involving actual or perceived threat of death or serious injury.: (Status: maintained). Impairment in social, occupational, or other areas of  functioning.:  (Status: improved). Intentionally avoids thoughts, feelings, or  discussions related to the traumatic event.: (Status: improved). Reports  response of intense fear, helplessness, or horror to the traumatic event.: (Status: improved). Symptoms present more than one month.:(Status: maintained).  Goals 1. Eliminate or reduce the negative impact trauma related symptoms have  on social, occupational, and family functioning. 2. No  longer experiences intrusive event recollections, avoidance of event  reminders, intense arousal, or disinterest in activities or  relationships. Objective Verbalize any  symptoms of depression, including any suicidal thoughts. Target Date:04/17/2025 Frequency: weekly to Biweekly Progress: 40 Modality: individual Objective Participate in Eye Movement Desensitization and Reprocessing (EMDR) to reduce emotional distress  related to traumatic thoughts, feelings, and images. Target Date: 04/17/2025 Frequency: Biweekly Progress: 60 Modality: individual Related Interventions 1. Utilize Eye Movement Desensitization and Reprocessing (EMDR) to reduce the client's  emotional reactivity to the traumatic event and reduce PTSD symptoms. Objective Learn and implement calming skills. Target Date: 04/17/2025 Frequency:weekly to Biweekly Progress: 20 Modality: individual Related Interventions 1. Teach the client calming skills (e.g., breathing retraining, relaxation, calming self-talk) to use in and between sessions when feeling overly distressed. Objective Participate in Cognitive Therapy to help identify, challenge, and replace biased, negative, and selfdefeating thoughts resulting from the trauma. Target Date: 04/17/2025 Frequency: weekly to Biweekly Progress: 20 Modality: individual Related Interventions 1. Using Cognitive Therapy techniques, explore the client's self-talk and beliefs about self, others,  and the future that are a consequence of the trauma (e.g., themes of safety, trust, power, control,  esteem, and intimacy); identify and challenge biases; assist him/her in generating appraisals that correct for the biases; test biased and alternatives predictions through behavioral experiments. Objective Learn and implement guided self-dialogue to manage thoughts, feelings, and urges brought on by  encounters with trauma-related situations. Target Date: 04/17/2025 Frequency: weekly to Biweekly Progress: 50 Modality: individual Related Interventions 1. Teach the client a guided self-dialogue procedure in which he/she learns to recognize  maladaptive self-talk,  challenges its biases, copes with engendered feelings, overcomes  avoidance, and reinforces his/her accomplishments; review and reinforce progress, problemsolve obstacles. Objective Learn and implement approaches for addressing shame and self-disparagement. Target Date: 04/17/2025 Frequency: weekly to Biweekly Progress: 0 Modality: individual Diagnosis F43.10 (Posttraumatic stress disorder) - Open -signifier: n/a posttraumatic Stress  Disorder  Panic Disorder Conditions For Discharge Achievement of treatment goals and objectives Patient is scheduled for her next session within the next two weeks. The patient approved this treatment plan.     Velisa Regnier G Deontez Klinke, LCSW

## 2024-08-01 ENCOUNTER — Other Ambulatory Visit: Payer: Self-pay

## 2024-08-01 DIAGNOSIS — G4719 Other hypersomnia: Secondary | ICD-10-CM

## 2024-08-01 DIAGNOSIS — G35D Multiple sclerosis, unspecified: Secondary | ICD-10-CM

## 2024-08-01 DIAGNOSIS — R5383 Other fatigue: Secondary | ICD-10-CM

## 2024-08-01 NOTE — Telephone Encounter (Signed)
 Please resend with ICD code per the pharmacy

## 2024-08-02 MED ORDER — PHENTERMINE HCL 30 MG PO CAPS: 30.0000 mg | ORAL_CAPSULE | ORAL | 1 refills | Status: AC
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
## 2024-08-14 ENCOUNTER — Ambulatory Visit (HOSPITAL_BASED_OUTPATIENT_CLINIC_OR_DEPARTMENT_OTHER): Admitting: Psychiatry

## 2024-08-14 ENCOUNTER — Encounter (HOSPITAL_COMMUNITY): Payer: Self-pay | Admitting: Psychiatry

## 2024-08-14 ENCOUNTER — Ambulatory Visit: Admitting: Psychology

## 2024-08-14 VITALS — BP 144/75 | HR 82

## 2024-08-14 DIAGNOSIS — F331 Major depressive disorder, recurrent, moderate: Secondary | ICD-10-CM | POA: Diagnosis not present

## 2024-08-14 DIAGNOSIS — F4310 Post-traumatic stress disorder, unspecified: Secondary | ICD-10-CM | POA: Insufficient documentation

## 2024-08-14 DIAGNOSIS — Z79899 Other long term (current) drug therapy: Secondary | ICD-10-CM

## 2024-08-14 DIAGNOSIS — F411 Generalized anxiety disorder: Secondary | ICD-10-CM | POA: Diagnosis not present

## 2024-08-14 DIAGNOSIS — F410 Panic disorder [episodic paroxysmal anxiety] without agoraphobia: Secondary | ICD-10-CM | POA: Insufficient documentation

## 2024-08-14 MED ORDER — PROPRANOLOL HCL 10 MG PO TABS: 10.0000 mg | ORAL_TABLET | Freq: Every day | ORAL | 0 refills | Status: DC | PRN

## 2024-08-14 MED ORDER — DULOXETINE HCL 30 MG PO CPEP: 30.0000 mg | ORAL_CAPSULE | Freq: Every day | ORAL | 0 refills | Status: DC

## 2024-08-14 MED ORDER — DULOXETINE HCL 60 MG PO CPEP: 60.0000 mg | ORAL_CAPSULE | Freq: Every day | ORAL | 0 refills | Status: DC

## 2024-08-14 MED ORDER — ARIPIPRAZOLE 5 MG PO TABS: 5.0000 mg | ORAL_TABLET | Freq: Every day | ORAL | 0 refills | Status: DC

## 2024-08-22 ENCOUNTER — Ambulatory Visit: Admitting: Psychology

## 2024-08-22 DIAGNOSIS — F410 Panic disorder [episodic paroxysmal anxiety] without agoraphobia: Secondary | ICD-10-CM

## 2024-08-22 DIAGNOSIS — F4310 Post-traumatic stress disorder, unspecified: Secondary | ICD-10-CM

## 2024-08-22 DIAGNOSIS — F331 Major depressive disorder, recurrent, moderate: Secondary | ICD-10-CM

## 2024-08-22 DIAGNOSIS — F423 Hoarding disorder: Secondary | ICD-10-CM

## 2024-08-22 NOTE — Progress Notes (Signed)
 [facility] Counselor/Therapist Progress Note  Patient ID: Kristen Brown, MRN: [id number],    Date: 07/30/2024  Time Spent: 64 minutes  Time in:  8:11 Time out:9:15  Treatment Type: Individual Therapy  Reported Symptoms: sadness, frustration, anxiety  Mental Status Exam: Appearance:  Casual     Behavior: Appropriate  Motor: Normal  Speech/Language:  Normal Rate  Affect: blunted  Mood: pleasant  Thought process: normal  Thought content:   WNL  Sensory/Perceptual disturbances:   WNL  Orientation: oriented to person, place, time/date, and situation  Attention: Good  Concentration: Good  Memory: WNL  Fund of knowledge:  Good  Insight:   Good  Judgment:  Good  Impulse Control: Good   Risk Assessment: Danger to Self:  No Self-injurious Behavior: No Danger to Others: No Duty to Warn:no Physical Aggression / Violence:No  Access to Firearms a concern: No  Gang Involvement:No   Subjective: The patient attended a face-to-face individual therapy session via video today.  The patient gave verbal consent for the video to be on Caregility and is aware of the limitations of telehealth. The patient was alone in her car alone and the therapist was in the office.  The patient presents as pleasant and cooperative.  The patient talked about her birthday and apparently her family did not remember her birthday until later in the day.  We talked about how she feels about that.  In addition we talked about her going to the psychiatrist and apparently the psychiatrist did increase her medication that she is on and take her off of the Lexapro .  She said that she was on Celexa and that was increased.  I am glad that she is now established with a medication provider that takes her insurance.  In addition we  did some brainstorming about how to have a conversation with her father so that it is possible that she could have access to some allowance or something for taking care of her  grandmother.  He still has not turned in the paperwork for her to get paid through Medicaid.  In addition we talked about her talking to a friend of his that he respects because he is having surgery on December 16 for his prostate and he is 49 years old and there is some concern that he could be getting dementia and she is going to end up having to take care of all of them and not get compensated if she does not have some of these hard conversations.  I recommended that she talk to this friend that he respects and have a conversation with her to find out if he is made any arrangements to have anybody be in charge of his money if something should happen to him.  The patient felt that that was a good idea and she is planning to address that with this friend shortly and also she is going to have him take the paper that he needs to take so that she can be compensated through Medicaid for her grandmother.   Interventions:  Cognitive Behavioral therapy, EMDR, meditation and mindfulness, psychoeducation, Problem solving, interpersonal  Diagnosis:  PTSD Hoarding disorder  Plan of Care: Plan: Client Abilities/Strengths  Intelligent, ability for insight,  Client Treatment Preferences  Outpatient individual therapy  Client Statement of Needs  I went to see a Disability therapist and he suggested I get therapy for my PTSD  Treatment Level  Outpatient Individual therapy  Symptoms  Displays a significant decline in interest and  engagement in activities.:  (Status:  improved). Displays significant psychological and/or physiological distress resulting from internal and  external clues that are reminiscent of the traumatic event.:(Status: maintained). Experiences disturbances in sleep.: (Status: improved). Experiences disturbing  and persistent thoughts, images, and/or perceptions of the traumatic event.:  (Status: improved). Experiences frequent nightmares.:  (Status: improved). Has  been exposed to a  traumatic event involving actual or perceived threat of death or serious injury.: (Status: maintained). Impairment in social, occupational, or other areas of  functioning.:  (Status: improved). Intentionally avoids thoughts, feelings, or  discussions related to the traumatic event.: (Status: improved). Reports  response of intense fear, helplessness, or horror to the traumatic event.: (Status: improved). Symptoms present more than one month.:(Status: maintained).  Goals 1. Eliminate or reduce the negative impact trauma related symptoms have  on social, occupational, and family functioning. 2. No longer experiences intrusive event recollections, avoidance of event  reminders, intense arousal, or disinterest in activities or  relationships. Objective Verbalize any symptoms of depression, including any suicidal thoughts. Target Date:04/17/2025 Frequency: weekly to Biweekly Progress: 40 Modality: individual Objective Participate in Eye Movement Desensitization and Reprocessing (EMDR) to reduce emotional distress  related to traumatic thoughts, feelings, and images. Target Date: 04/17/2025 Frequency: Biweekly Progress: 60 Modality: individual Related Interventions 1. Utilize Eye Movement Desensitization and Reprocessing (EMDR) to reduce the client's  emotional reactivity to the traumatic event and reduce PTSD symptoms. Objective Learn and implement calming skills. Target Date: 04/17/2025 Frequency:weekly to Biweekly Progress: 20 Modality: individual Related Interventions 1. Teach the client calming skills (e.g., breathing retraining, relaxation, calming self-talk) to use in and between sessions when feeling overly distressed. Objective Participate in Cognitive Therapy to help identify, challenge, and replace biased, negative, and selfdefeating thoughts resulting from the trauma. Target Date: 04/17/2025 Frequency: weekly to Biweekly Progress: 20 Modality: individual Related  Interventions 1. Using Cognitive Therapy techniques, explore the client's self-talk and beliefs about self, others,  and the future that are a consequence of the trauma (e.g., themes of safety, trust, power, control,  esteem, and intimacy); identify and challenge biases; assist him/her in generating appraisals that correct for the biases; test biased and alternatives predictions through behavioral experiments. Objective Learn and implement guided self-dialogue to manage thoughts, feelings, and urges brought on by  encounters with trauma-related situations. Target Date: 04/17/2025 Frequency: weekly to Biweekly Progress: 50 Modality: individual Related Interventions 1. Teach the client a guided self-dialogue procedure in which he/she learns to recognize  maladaptive self-talk, challenges its biases, copes with engendered feelings, overcomes  avoidance, and reinforces his/her accomplishments; review and reinforce progress, problemsolve obstacles. Objective Learn and implement approaches for addressing shame and self-disparagement. Target Date: 04/17/2025 Frequency: weekly to Biweekly Progress: 0 Modality: individual Diagnosis F43.10 (Posttraumatic stress disorder) - Open -signifier: n/a posttraumatic Stress  Disorder  Panic Disorder Conditions For Discharge Achievement of treatment goals and objectives Patient is scheduled for her next session within the next two weeks. The patient approved this treatment plan.     Redmond Whittley G Cadie Sorci, LCSW

## 2024-08-27 ENCOUNTER — Ambulatory Visit (HOSPITAL_COMMUNITY)

## 2024-08-27 DIAGNOSIS — Z79899 Other long term (current) drug therapy: Secondary | ICD-10-CM

## 2024-08-27 NOTE — Progress Notes (Signed)
 Patient arrived for her due fasting lipid panel. I drew a green top using a 22G straight needle in the Right AC. Patient tolerated well and without complaint

## 2024-08-28 LAB — LIPID PANEL
Chol/HDL Ratio: 4.4 ratio (ref 0.0–4.4)
Cholesterol, Total: 185 mg/dL (ref 100–199)
HDL: 42 mg/dL (ref >39)
LDL Chol Calc (NIH): 109 mg/dL — ABNORMAL HIGH (ref 0–99)
Triglycerides: 192 mg/dL — ABNORMAL HIGH (ref 0–149)
VLDL Cholesterol Cal: 34 mg/dL (ref 5–40)

## 2024-08-29 ENCOUNTER — Ambulatory Visit (HOSPITAL_COMMUNITY): Payer: Self-pay | Admitting: Psychiatry

## 2024-09-03 ENCOUNTER — Ambulatory Visit: Admitting: Psychology

## 2024-09-03 DIAGNOSIS — F4310 Post-traumatic stress disorder, unspecified: Secondary | ICD-10-CM

## 2024-09-03 DIAGNOSIS — F423 Hoarding disorder: Secondary | ICD-10-CM | POA: Diagnosis not present

## 2024-09-03 NOTE — Progress Notes (Unsigned)
                edge,  experiencing concentration difficulties, having trouble falling or staying asleep, exhibiting a general  state of irritability).: No Description Entered (Status: improved). Motor tension (e.g., restlessness,  tiredness, shakiness, muscle tension).: No Description Entered (Status: improved).  Problems Addressed  Anxiety, Phase Of Life Problems, Anxiety  Goals 1. Learn and implement coping skills that result in a reduction of anxiety  and worry, and improved daily functioning. Objective Learn  and implement calming skills to reduce overall anxiety and manage anxiety symptoms. Target Date: 2025-08-09Frequency: Weekly Progress: 40 Modality: individual  Related Interventions 1. Teach the client calming/relaxation skills (e.g., applied relaxation, progressive muscle  relaxation, cue controlled relaxation; mindful breathing; biofeedback) and how to discriminate  better between relaxation and tension; teach the client how to apply these skills to his/her daily  life (e.g., New Directions in Progressive Muscle Relaxation by Marcelyn Ditty, and  Hazlett-Stevens; Treating Generalized Anxiety Disorder by Rygh and Ida Rogue). Objective Identify, challenge, and replace biased, fearful self-talk with positive, realistic, and empowering selftalk. Target Date: 2024-04-27 Frequency: weekly Progress: 30 Modality: individual Related Interventions 1. Explore the client's schema and self-talk that mediate his/her fear response; assist him/her in  challenging the biases; replace the distorted messages with reality-based alternatives and  positive, realistic self-talk that will increase his/her self-confidence in coping with irrational  fears (see Cognitive Therapy of Anxiety Disorders by Laurence Slate). Objective Learn and implement problem-solving strategies for realistically addressing worries. Target Date: 2025-08-09Frequency: weekly Progress: 40 Modality: individual 2. Resolve conflicted feelings and adapt to the new life circumstances. Objective Apply problem-solving skills to current circumstances. Target Date: 2024-04-27 Frequency: weekly Progress: 20 Modality: individual Related Interventions 1. Teach the client problem-resolution skills (e.g., defining the problem clearly, brainstorming  multiple solutions, listing the pros and cons of each solution, seeking input from others,  selecting and implementing a plan of action, evaluating outcome, and readjusting plan as   necessary).   3. Stabilize anxiety level while increasing ability to function on a daily  basis. 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  The patient approved this plan.   Deonna Krummel G Ethridge Sollenberger, LCSW

## 2024-09-05 ENCOUNTER — Other Ambulatory Visit (HOSPITAL_COMMUNITY): Payer: Self-pay | Admitting: Psychiatry

## 2024-09-05 DIAGNOSIS — F331 Major depressive disorder, recurrent, moderate: Secondary | ICD-10-CM

## 2024-09-05 MED ORDER — DULOXETINE HCL 60 MG PO CPEP: 60.0000 mg | ORAL_CAPSULE | Freq: Every day | ORAL | 0 refills | Status: DC

## 2024-09-05 MED ORDER — DULOXETINE HCL 30 MG PO CPEP: 30.0000 mg | ORAL_CAPSULE | Freq: Every day | ORAL | 0 refills | Status: DC

## 2024-09-05 NOTE — Telephone Encounter (Signed)
 Duplicate, this has already been sent

## 2024-09-05 NOTE — Progress Notes (Signed)
 Pharmacy request for 90 day rx. Sent in 90 day rx of cymbalta  60mg  and cymbalta  30mg  for a total of 90mg .

## 2024-09-10 ENCOUNTER — Encounter: Payer: Self-pay | Admitting: Neurology

## 2024-09-17 ENCOUNTER — Ambulatory Visit: Admitting: Psychology

## 2024-09-17 DIAGNOSIS — F423 Hoarding disorder: Secondary | ICD-10-CM | POA: Diagnosis not present

## 2024-09-17 DIAGNOSIS — F4310 Post-traumatic stress disorder, unspecified: Secondary | ICD-10-CM | POA: Diagnosis not present

## 2024-09-17 DIAGNOSIS — F331 Major depressive disorder, recurrent, moderate: Secondary | ICD-10-CM

## 2024-09-17 NOTE — Progress Notes (Signed)
 " [facility] Counselor/Therapist Progress Note  Patient ID: KHAMYA TOPP, MRN: [id number],    Date: 09/17/2024  Time Spent: 55 minutes  Time in:  12:04 Time out:12:59  Treatment Type: Individual Therapy  Reported Symptoms: sadness, frustration, anxiety  Mental Status Exam: Appearance:  Casual     Behavior: Appropriate  Motor: Normal  Speech/Language:  Normal Rate  Affect: blunted  Mood: pleasant  Thought process: normal  Thought content:   WNL  Sensory/Perceptual disturbances:   WNL  Orientation: oriented to person, place, time/date, and situation  Attention: Good  Concentration: Good  Memory: WNL  Fund of knowledge:  Good  Insight:   Good  Judgment:  Good  Impulse Control: Good   Risk Assessment: Danger to Self:  No Self-injurious Behavior: No Danger to Others: No Duty to Warn:no Physical Aggression / Violence:No  Access to Firearms a concern: No  Gang Involvement:No   Subjective: The patient attended a face-to-face individual therapy session via video today.  The patient gave verbal consent for the video to be on Caregility and is aware of the limitations of telehealth. The patient was alone in her car alone and the therapist was in the office.  The patient presents as sad and distracted today.  The patient reports that the holidays were okay but she said her father gave her $27 but then he turned around and told her that he really did a big thing by giving her the money.  Of course she still has not asked him about paying her to take care of her grandmother and I think hospice is coming in to take care of her grandmother and that may cause problems with her being able to get funding for the care she provides.  I did encourage the patient to talk with her father and at some point because I do feel that he needs to be helping her with some way to live financially.  In addition we talked about what she is doing to take care of herself and she is not doing a  very good job of setting limits or standing up for herself.  I encouraged her to do these things before she starts with a new therapist.  Interventions:  Cognitive Behavioral therapy, EMDR, meditation and mindfulness, psychoeducation, Problem solving, interpersonal  Diagnosis:  PTSD Hoarding disorder  Plan of Care: Plan: Client Abilities/Strengths  Intelligent, ability for insight,  Client Treatment Preferences  Outpatient individual therapy  Client Statement of Needs  I went to see a Disability therapist and he suggested I get therapy for my PTSD  Treatment Level  Outpatient Individual therapy  Symptoms  Displays a significant decline in interest and engagement in activities.:  (Status:  improved). Displays significant psychological and/or physiological distress resulting from internal and  external clues that are reminiscent of the traumatic event.:(Status: maintained). Experiences disturbances in sleep.: (Status: improved). Experiences disturbing  and persistent thoughts, images, and/or perceptions of the traumatic event.:  (Status: improved). Experiences frequent nightmares.:  (Status: improved). Has  been exposed to a traumatic event involving actual or perceived threat of death or serious injury.: (Status: maintained). Impairment in social, occupational, or other areas of  functioning.:  (Status: improved). Intentionally avoids thoughts, feelings, or  discussions related to the traumatic event.: (Status: improved). Reports  response of intense fear, helplessness, or horror to the traumatic event.: (Status: improved). Symptoms present more than one month.:(Status: maintained).  Goals 1. Eliminate or reduce the negative impact trauma related symptoms have  on social,  occupational, and family functioning. 2. No longer experiences intrusive event recollections, avoidance of event  reminders, intense arousal, or disinterest in activities or  relationships. Objective Verbalize any  symptoms of depression, including any suicidal thoughts. Target Date:04/17/2025 Frequency: weekly to Biweekly Progress: 50 Modality: individual Objective Participate in Eye Movement Desensitization and Reprocessing (EMDR) to reduce emotional distress  related to traumatic thoughts, feelings, and images. Target Date: 04/17/2025 Frequency: Biweekly Progress: 60 Modality: individual Related Interventions 1. Utilize Eye Movement Desensitization and Reprocessing (EMDR) to reduce the client's  emotional reactivity to the traumatic event and reduce PTSD symptoms. Objective Learn and implement calming skills. Target Date: 04/17/2025 Frequency:weekly to Biweekly Progress: 30 Modality: individual Related Interventions 1. Teach the client calming skills (e.g., breathing retraining, relaxation, calming self-talk) to use in and between sessions when feeling overly distressed. Objective Participate in Cognitive Therapy to help identify, challenge, and replace biased, negative, and selfdefeating thoughts resulting from the trauma. Target Date: 04/17/2025 Frequency: weekly to Biweekly Progress: 30 Modality: individual Related Interventions 1. Using Cognitive Therapy techniques, explore the client's self-talk and beliefs about self, others,  and the future that are a consequence of the trauma (e.g., themes of safety, trust, power, control,  esteem, and intimacy); identify and challenge biases; assist him/her in generating appraisals that correct for the biases; test biased and alternatives predictions through behavioral experiments. Objective Learn and implement guided self-dialogue to manage thoughts, feelings, and urges brought on by  encounters with trauma-related situations. Target Date: 04/17/2025 Frequency: weekly to Biweekly Progress: 50 Modality: individual Related Interventions 1. Teach the client a guided self-dialogue procedure in which he/she learns to recognize  maladaptive self-talk,  challenges its biases, copes with engendered feelings, overcomes  avoidance, and reinforces his/her accomplishments; review and reinforce progress, problemsolve obstacles. Objective Learn and implement approaches for addressing shame and self-disparagement. Target Date: 04/17/2025 Frequency: weekly to Biweekly Progress: 0 Modality: individual Diagnosis F43.10 (Posttraumatic stress disorder) - Open -signifier: n/a posttraumatic Stress  Disorder  Panic Disorder Conditions For Discharge Achievement of treatment goals and objectives Patient is scheduled for her next session within the next two weeks. The patient approved this treatment plan.     Zaeem Kandel G Maryann Mccall, LCSW                                                                                                      "

## 2024-09-19 ENCOUNTER — Telehealth: Admitting: Physician Assistant

## 2024-09-19 DIAGNOSIS — R3989 Other symptoms and signs involving the genitourinary system: Secondary | ICD-10-CM

## 2024-09-19 MED ORDER — CEPHALEXIN 500 MG PO CAPS: 500.0000 mg | ORAL_CAPSULE | Freq: Two times a day (BID) | ORAL | 0 refills | Status: DC

## 2024-09-19 NOTE — Progress Notes (Signed)

## 2024-09-24 ENCOUNTER — Ambulatory Visit: Admitting: Obstetrics and Gynecology

## 2024-09-24 ENCOUNTER — Encounter: Payer: Self-pay | Admitting: Obstetrics and Gynecology

## 2024-09-24 VITALS — BP 124/72 | HR 84 | Temp 97.5°F | Ht 69.5 in | Wt 357.0 lb

## 2024-09-24 DIAGNOSIS — D259 Leiomyoma of uterus, unspecified: Secondary | ICD-10-CM | POA: Insufficient documentation

## 2024-09-24 DIAGNOSIS — N939 Abnormal uterine and vaginal bleeding, unspecified: Secondary | ICD-10-CM | POA: Insufficient documentation

## 2024-09-24 NOTE — Assessment & Plan Note (Addendum)
 Patient with AUB-F and failed hormonal therapy Therapies tried: Mirena IUD, Provera, COC (current) 09/15/24 Hgb 12.5  She is interested in discussing procedures with a short recovery time due to current caregiving for family member.  We discussed uterine artery embolization, as an outpatient interventional procedure with minimal recovery time (usually 2 days).  Procedures performed by interventional radiologist and will require referral.  Reviewed that it has a greater than 75% efficacy for heavy vaginal bleeding.  However 25% of patients still require hysterectomy within 2 years of procedure. Discussed late complication of fibroid expulsion, this typically occurs with submucosal fibroids. She is aware that this is procedure does not provide contraception.  We also briefly discussed discussed robotic assisted hysterectomy.  Recovery time is 6 weeks.  She does not feel she has a time for this procedure at the time.  She would like to proceed with referral for [place]. EMB completed within last year. I recommend that she use COC continuously until her procedure.  Following her procedure, I would recommend that she switch to POP for ongoing contraception. All questions answered.

## 2024-09-24 NOTE — Progress Notes (Signed)
 "  49 y.o. G9P0101 female with AUB, fibroids, multiple sclerosis, type 2 diabetes here for surgical consultation. Single, boyfriend. Referred by Margarete SHIPPER.  Patient's last menstrual period was 09/24/2024 (exact date). Period Duration (Days): 12 Period Pattern: (!) Irregular Menstrual Flow: Heavy Dysmenorrhea: (!) Moderate Dysmenorrhea Symptoms: Cramping, Diarrhea, Nausea  She reports ongoing prolonged heavy periods.  She is having periods twice a month that usually last for 2 weeks. She reports light bleeding today on 2nd pack of COC.  She had a Mirena IUD placed April 2025 which was malpositioned and removed in November 2025. She has since tried high dose progestins and COC's for bleeding control.  She is interested in a procedure with short recovery time to help with bleeding. She is currently caregiving for family. She does not desire fertility and would like to continue contraception at this time.  Workup included an referral: 11/13/23 NIL HPV negative 11/23/2023 EMB benign 01/10/2024 TVUS: Uterus 12 x 9 x 9 cm, 4 fibroids measured.  The largest measuring 3.7 cm.  IUD was inserted at this time. 08/07/2024 TVUS: Uterus 13 x 9 x 10 cm, 5 fibroids measured.  The largest measuring 4.4 cm.  Fibroids had increased in size.  Endometrium was distorted due to fibroids.  IUD was noted in lower uterine segment and was removed.  3 cm simple cyst of the left ovary was present. Last PAP:    Component Value Date/Time   DIAGPAP  04/12/2018 0000    NEGATIVE FOR INTRAEPITHELIAL LESIONS OR MALIGNANCY.   ADEQPAP  04/12/2018 0000    Satisfactory for evaluation  endocervical/transformation zone component ABSENT.   Birth control: COC Sexually active: yes  OB History  Gravida Para Term Preterm AB Living  1 1  1  1   SAB IAB Ectopic Multiple Live Births      1    # Outcome Date GA Lbr Len/2nd Weight Sex Type Anes PTL Lv  1 Preterm      Vag-Spont   LIV    Past Medical History:  Diagnosis Date    Anxiety    Depression    Gallstones 07/2014   GERD (gastroesophageal reflux disease)    no current med.   Irritable bowel syndrome (IBS)    Migraines    Multiple sclerosis    Sinus infection 08/11/2014   will be finished with antibiotic prior to surgery   Vision abnormalities     Past Surgical History:  Procedure Laterality Date   CHOLECYSTECTOMY N/A 08/19/2014   Procedure: LAPAROSCOPIC CHOLECYSTECTOMY WITH INTRAOPERATIVE CHOLANGIOGRAM;  Surgeon: Debby Shipper, MD;  Location: [facility];  Service: General;  Laterality: N/A;   COLONOSCOPY     2022    Medications Ordered Prior to Encounter[1]  Allergies[2]    PE Today's Vitals   09/24/24 1400  BP: 124/72  Pulse: 84  Temp: (!) 97.5 F (36.4 C)  TempSrc: Oral  SpO2: 98%  Weight: (!) 357 lb (161.9 kg)  Height: 5' 9.5 (1.765 m)   Body mass index is 51.96 kg/m.  Physical Exam Vitals reviewed.  Constitutional:      General: She is not in acute distress.    Appearance: Normal appearance.  HENT:     Head: Normocephalic and atraumatic.     Nose: Nose normal.  Eyes:     Extraocular Movements: Extraocular movements intact.     Conjunctiva/sclera: Conjunctivae normal.  Pulmonary:     Effort: Pulmonary effort is normal.  Musculoskeletal:  General: Normal range of motion.     Cervical back: Normal range of motion.  Neurological:     General: No focal deficit present.     Mental Status: She is alert.  Psychiatric:        Mood and Affect: Mood normal.        Behavior: Behavior normal.      Assessment and Plan:        Abnormal uterine bleeding (AUB) Assessment & Plan: Patient with AUB-F and failed hormonal therapy Therapies tried: Mirena IUD, Provera, COC (current) 09/15/24 Hgb 12.5  She is interested in discussing procedures with a short recovery time due to current caregiving for family member.  We discussed uterine artery embolization, as an outpatient interventional procedure with  minimal recovery time (usually 2 days).  Procedures performed by interventional radiologist and will require referral.  Reviewed that it has a greater than 75% efficacy for heavy vaginal bleeding.  However 25% of patients still require hysterectomy within 2 years of procedure. Discussed late complication of fibroid expulsion, this typically occurs with submucosal fibroids. She is aware that this is procedure does not provide contraception.  We also briefly discussed discussed robotic assisted hysterectomy.  Recovery time is 6 weeks.  She does not feel she has a time for this procedure at the time.  She would like to proceed with referral for [place]. EMB completed within last year. I recommend that she use COC continuously until her procedure.  Following her procedure, I would recommend that she switch to POP for ongoing contraception. All questions answered.  Orders: -     Ambulatory referral to Interventional Radiology  Uterine leiomyoma, unspecified location -     Ambulatory referral to Interventional Radiology    53 min  total time was spent for this patient encounter, including preparation, face-to-face counseling with the patient, coordination of care, and documentation of the encounter.  Vera LULLA Pa, MD     [1]  Current Outpatient Medications on File Prior to Visit  Medication Sig Dispense Refill   ARIPiprazole  (ABILIFY ) 5 MG tablet Take 1 tablet (5 mg total) by mouth daily. 60 tablet 0   baclofen  (LIORESAL ) 10 MG tablet Take 1 tablet (10 mg total) by mouth 3 (three) times daily as needed for muscle spasms. 90 each 11   cetirizine (ZYRTEC ALLERGY) 10 MG tablet 1 tablet Orally Once a day     Cholecalciferol (VITAMIN D3) 125 MCG (5000 UT) TABS Take 1 tablet by mouth daily.     diclofenac  (VOLTAREN ) 75 MG EC tablet Take 1 tablet (75 mg total) by mouth 2 (two) times daily. 30 tablet 2   DULoxetine  (CYMBALTA ) 30 MG capsule Take 1 capsule (30 mg total) by mouth daily. Take with 60mg   for a total of 90mg  daily starting 12/3 90 capsule 0   DULoxetine  (CYMBALTA ) 60 MG capsule Take 1 capsule (60 mg total) by mouth daily. Take 60mg  with 30mg  of duloxetine . Take a total of 90mg  daily starting 12/3 90 capsule 0   gabapentin  (NEURONTIN ) 300 MG capsule Take 1 capsule (300 mg total) by mouth 3 (three) times daily. 90 capsule 11   ibuprofen  (ADVIL ) 800 MG tablet Take 800 mg by mouth.     Krill Oil 1000 MG CAPS 1 capsule.     loratadine (CLARITIN) 10 MG tablet Take 10 mg by mouth daily.     LORazepam (ATIVAN) 0.5 MG tablet Take 0.5 mg by mouth every 8 (eight) hours.     magnesium gluconate (  MAGONATE) 500 MG tablet Take 250 mg by mouth daily.     norgestimate-ethinyl estradiol (ORTHO-CYCLEN) 0.25-35 MG-MCG tablet Take 1 tablet by mouth daily.     ocrelizumab (OCREVUS) 300 MG/10ML injection Inject 600 mg into the vein every 6 (six) months.     omeprazole (PRILOSEC) 40 MG capsule 2 capsules 30 minutes before morning meal Orally Once a day     OZEMPIC, 0.25 OR 0.5 MG/DOSE, 2 MG/3ML SOPN SMARTSIG:0.25 Milligram(s) SUB-Q Once a Week     phentermine  30 MG capsule Take 1 capsule (30 mg total) by mouth every morning. 90 capsule 1   Prasterone, DHEA, (DHEA PO) Take by mouth.     propranolol  (INDERAL ) 10 MG tablet Take 1 tablet (10 mg total) by mouth daily as needed (anxiety, panic attacks, sleep). 60 tablet 0   SUMAtriptan  (IMITREX ) 50 MG tablet Take 1 tablet (50 mg total) by mouth as needed for migraine. May repeat in 2 hours if headache persists or recurs. 10 tablet 11   topiramate (TOPAMAX) 25 MG tablet 1 tablet Orally Once a day     Vitamin D -Vitamin K (VITAMIN K2-VITAMIN D3) 90-125 MCG CAPS Take by mouth.     No current facility-administered medications on file prior to visit.  [2]  Allergies Allergen Reactions   Sulfa Antibiotics Hives   Adhesive [Tape] Rash   Latex Rash   "

## 2024-09-27 ENCOUNTER — Other Ambulatory Visit: Payer: Self-pay | Admitting: Obstetrics and Gynecology

## 2024-09-27 ENCOUNTER — Other Ambulatory Visit: Payer: Self-pay | Admitting: Interventional Radiology

## 2024-09-27 DIAGNOSIS — D259 Leiomyoma of uterus, unspecified: Secondary | ICD-10-CM

## 2024-10-02 ENCOUNTER — Ambulatory Visit: Admitting: Psychology

## 2024-10-02 ENCOUNTER — Encounter: Payer: Self-pay | Admitting: Obstetrics and Gynecology

## 2024-10-02 DIAGNOSIS — F423 Hoarding disorder: Secondary | ICD-10-CM | POA: Diagnosis not present

## 2024-10-02 DIAGNOSIS — N939 Abnormal uterine and vaginal bleeding, unspecified: Secondary | ICD-10-CM

## 2024-10-02 DIAGNOSIS — F4310 Post-traumatic stress disorder, unspecified: Secondary | ICD-10-CM

## 2024-10-02 MED ORDER — MEDROXYPROGESTERONE ACETATE 10 MG PO TABS: 10.0000 mg | ORAL_TABLET | Freq: Two times a day (BID) | ORAL | 1 refills | Status: AC

## 2024-10-02 NOTE — Telephone Encounter (Signed)
 OV 09/24/24 Per review of EPIC, patient is scheduled for MRI pelvis on 10/06/23 and with IR on 10/17/24.

## 2024-10-02 NOTE — Progress Notes (Signed)
 " [facility] Counselor/Therapist Progress Note  Patient ID: Kristen Brown, MRN: [id number],    Date: 10/02/2024  Time Spent: 57 minutes  Time in:  9:05 Time out:10:02  Treatment Type: Individual Therapy  Reported Symptoms: sadness, frustration, anxiety  Mental Status Exam: Appearance:  Casual     Behavior: Appropriate  Motor: Normal  Speech/Language:  Normal Rate  Affect: blunted  Mood: pleasant  Thought process: normal  Thought content:   WNL  Sensory/Perceptual disturbances:   WNL  Orientation: oriented to person, place, time/date, and situation  Attention: Good  Concentration: Good  Memory: WNL  Fund of knowledge:  Good  Insight:   Good  Judgment:  Good  Impulse Control: Good   Risk Assessment: Danger to Self:  No Self-injurious Behavior: No Danger to Others: No Duty to Warn:no Physical Aggression / Violence:No  Access to Firearms a concern: No  Gang Involvement:No   Subjective: The patient attended a face-to-face individual therapy session via video today.  The patient gave verbal consent for the video to be on Caregility and is aware of the limitations of telehealth. The patient was alone in her car alone and the therapist was in the office.  The patient presents as a little bit anxious today.  She reports that her grandmother fell 2 nights ago and ended up having to go to the hospital and had fractured one of her vertebrae in her spine.  The patient seems to be a little distracted by worrying about her.  The patient is still does not have any kind of income coming in but she did get some pension money that she got from a previous employer that she can manage on for a little while.  I still am trying to encourage her to talk with her father and see about getting that taken care of as I know she tends to get respite out when she does not have money to manage on.   Interventions:  Cognitive Behavioral therapy, EMDR, meditation and mindfulness,  psychoeducation, Problem solving, interpersonal  Diagnosis:  PTSD Hoarding disorder  Plan of Care: Plan: Client Abilities/Strengths  Intelligent, ability for insight,  Client Treatment Preferences  Outpatient individual therapy  Client Statement of Needs  I went to see a Disability therapist and he suggested I get therapy for my PTSD  Treatment Level  Outpatient Individual therapy  Symptoms  Displays a significant decline in interest and engagement in activities.:  (Status:  improved). Displays significant psychological and/or physiological distress resulting from internal and  external clues that are reminiscent of the traumatic event.:(Status: maintained). Experiences disturbances in sleep.: (Status: improved). Experiences disturbing  and persistent thoughts, images, and/or perceptions of the traumatic event.:  (Status: improved). Experiences frequent nightmares.:  (Status: improved). Has  been exposed to a traumatic event involving actual or perceived threat of death or serious injury.: (Status: maintained). Impairment in social, occupational, or other areas of  functioning.:  (Status: improved). Intentionally avoids thoughts, feelings, or  discussions related to the traumatic event.: (Status: improved). Reports  response of intense fear, helplessness, or horror to the traumatic event.: (Status: improved). Symptoms present more than one month.:(Status: maintained).  Goals 1. Eliminate or reduce the negative impact trauma related symptoms have  on social, occupational, and family functioning. 2. No longer experiences intrusive event recollections, avoidance of event  reminders, intense arousal, or disinterest in activities or  relationships. Objective Verbalize any symptoms of depression, including any suicidal thoughts. Target Date:04/17/2025 Frequency: weekly to Biweekly Progress: 50 Modality:  individual Objective Participate in Eye Movement Desensitization and Reprocessing  (EMDR) to reduce emotional distress  related to traumatic thoughts, feelings, and images. Target Date: 04/17/2025 Frequency: Biweekly Progress: 60 Modality: individual Related Interventions 1. Utilize Eye Movement Desensitization and Reprocessing (EMDR) to reduce the client's  emotional reactivity to the traumatic event and reduce PTSD symptoms. Objective Learn and implement calming skills. Target Date: 04/17/2025 Frequency:weekly to Biweekly Progress: 30 Modality: individual Related Interventions 1. Teach the client calming skills (e.g., breathing retraining, relaxation, calming self-talk) to use in and between sessions when feeling overly distressed. Objective Participate in Cognitive Therapy to help identify, challenge, and replace biased, negative, and selfdefeating thoughts resulting from the trauma. Target Date: 04/17/2025 Frequency: weekly to Biweekly Progress: 30 Modality: individual Related Interventions 1. Using Cognitive Therapy techniques, explore the client's self-talk and beliefs about self, others,  and the future that are a consequence of the trauma (e.g., themes of safety, trust, power, control,  esteem, and intimacy); identify and challenge biases; assist him/her in generating appraisals that correct for the biases; test biased and alternatives predictions through behavioral experiments. Objective Learn and implement guided self-dialogue to manage thoughts, feelings, and urges brought on by  encounters with trauma-related situations. Target Date: 04/17/2025 Frequency: weekly to Biweekly Progress: 50 Modality: individual Related Interventions 1. Teach the client a guided self-dialogue procedure in which he/she learns to recognize  maladaptive self-talk, challenges its biases, copes with engendered feelings, overcomes  avoidance, and reinforces his/her accomplishments; review and reinforce progress, problemsolve obstacles. Objective Learn and implement approaches for  addressing shame and self-disparagement. Target Date: 04/17/2025 Frequency: weekly to Biweekly Progress: 0 Modality: individual Diagnosis F43.10 (Posttraumatic stress disorder) - Open -signifier: n/a posttraumatic Stress  Disorder  Panic Disorder Conditions For Discharge Achievement of treatment goals and objectives Patient is scheduled for her next session within the next two weeks. The patient approved this treatment plan.     Kalei Meda G Azadeh Hyder, LCSW                                                                                                      "

## 2024-10-02 NOTE — Telephone Encounter (Signed)
 Dr. Dallie -please review and advise.
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
## 2024-10-05 ENCOUNTER — Ambulatory Visit (HOSPITAL_COMMUNITY)
Admission: RE | Admit: 2024-10-05 | Discharge: 2024-10-05 | Disposition: A | Source: Ambulatory Visit | Attending: Interventional Radiology

## 2024-10-05 DIAGNOSIS — D259 Leiomyoma of uterus, unspecified: Secondary | ICD-10-CM | POA: Diagnosis present

## 2024-10-05 MED ORDER — GADOPICLENOL 0.5 MMOL/ML IV SOLN
10.0000 mL | Freq: Once | INTRAVENOUS | Status: AC | PRN
  Administered 2024-10-05: 10 mL via INTRAVENOUS

## 2024-10-09 ENCOUNTER — Ambulatory Visit (HOSPITAL_COMMUNITY): Admitting: Psychiatry

## 2024-10-09 VITALS — BP 142/84 | HR 90

## 2024-10-09 DIAGNOSIS — F411 Generalized anxiety disorder: Secondary | ICD-10-CM | POA: Diagnosis not present

## 2024-10-09 DIAGNOSIS — F330 Major depressive disorder, recurrent, mild: Secondary | ICD-10-CM

## 2024-10-09 DIAGNOSIS — F4310 Post-traumatic stress disorder, unspecified: Secondary | ICD-10-CM | POA: Diagnosis not present

## 2024-10-09 DIAGNOSIS — F410 Panic disorder [episodic paroxysmal anxiety] without agoraphobia: Secondary | ICD-10-CM | POA: Diagnosis not present

## 2024-10-09 MED ORDER — DULOXETINE HCL 60 MG PO CPEP: 60.0000 mg | ORAL_CAPSULE | Freq: Every day | ORAL | 0 refills | Status: AC

## 2024-10-09 MED ORDER — DULOXETINE HCL 30 MG PO CPEP: 30.0000 mg | ORAL_CAPSULE | Freq: Every day | ORAL | 0 refills | Status: AC

## 2024-10-09 MED ORDER — ARIPIPRAZOLE 5 MG PO TABS: 5.0000 mg | ORAL_TABLET | Freq: Every day | ORAL | 0 refills | Status: AC

## 2024-10-09 MED ORDER — PROPRANOLOL HCL 10 MG PO TABS: 10.0000 mg | ORAL_TABLET | Freq: Every day | ORAL | 0 refills | Status: AC | PRN

## 2024-10-09 NOTE — Addendum Note (Signed)
 Addended by: CARVIN CROCK on: 10/09/2024 10:51 AM   Modules accepted: Level of Service

## 2024-10-16 ENCOUNTER — Ambulatory Visit: Admitting: Psychology

## 2024-10-16 DIAGNOSIS — F330 Major depressive disorder, recurrent, mild: Secondary | ICD-10-CM

## 2024-10-16 DIAGNOSIS — F410 Panic disorder [episodic paroxysmal anxiety] without agoraphobia: Secondary | ICD-10-CM

## 2024-10-16 DIAGNOSIS — F4310 Post-traumatic stress disorder, unspecified: Secondary | ICD-10-CM

## 2024-10-16 DIAGNOSIS — F423 Hoarding disorder: Secondary | ICD-10-CM | POA: Diagnosis not present

## 2024-10-16 DIAGNOSIS — F411 Generalized anxiety disorder: Secondary | ICD-10-CM | POA: Diagnosis not present

## 2024-10-16 NOTE — Consult Note (Signed)
 "     Chief Complaint: Patient was seen in consultation today for symptomatic uterine fibroids  Referring Physician(s): Hines,Genesis V  Supervising Physician: Karalee Beat  History of Present Illness: Kristen Brown is a 49 y.o. female with a medical history significant for anxiety/depression, IBS, migraines, multiple sclerosis and uterine fibroids with abnormal uterine bleeding. She has a history of ongoing, prolonged heavy periods. She often has periods twice a month which can last for two weeks. She had a Mirena IUD placed 2025 which became malpositioned and was removed November 2025. She has tried high dose progestins and combined hormonal contraceptives without much improvement.   She followed up with her gynecologist 09/24/24 and expressed a desire to pursue a treatment option with a short recovery time. Dr. Dallie discussed uterine artery embolization - the risks, benefits and procedural expectations. The patient was interested in this treatment and she has been kindly referred to Interventional Radiology for further discussion. An MRI pelvis was obtained 10/05/24.   She reports extremely lengthy cycles lasting 14-18 days. Every day of her cycle is heavy and in between cycles she has continuous brown discharge. During her cycle she must use tampons and pads to control the bleeding. She often goes through 36+ tampons during her cycle.   Past Medical History:  Diagnosis Date   Anxiety    Depression    Gallstones 07/2014   GERD (gastroesophageal reflux disease)    no current med.   Irritable bowel syndrome (IBS)    Migraines    Multiple sclerosis    Sinus infection 08/11/2014   will be finished with antibiotic prior to surgery   Vision abnormalities     Past Surgical History:  Procedure Laterality Date   CHOLECYSTECTOMY N/A 08/19/2014   Procedure: LAPAROSCOPIC CHOLECYSTECTOMY WITH INTRAOPERATIVE CHOLANGIOGRAM;  Surgeon: Debby Shipper, MD;  Location: [facility];  Service: General;  Laterality: N/A;   COLONOSCOPY     2022    Allergies: Sulfa antibiotics, Adhesive [tape], and Latex  Medications: Prior to Admission medications  Medication Sig Start Date End Date Taking? Authorizing Provider  ARIPiprazole  (ABILIFY ) 5 MG tablet Take 1 tablet (5 mg total) by mouth daily. 10/09/24 01/07/25  Chien, Stephanie, MD  baclofen  (LIORESAL ) 10 MG tablet Take 1 tablet (10 mg total) by mouth 3 (three) times daily as needed for muscle spasms. 06/26/24   Whitfield Raisin, NP  cetirizine (ZYRTEC ALLERGY) 10 MG tablet 1 tablet Orally Once a day    [provider]  Cholecalciferol (VITAMIN D3) 125 MCG (5000 UT) TABS Take 1 tablet by mouth daily.    [provider]  diclofenac  (VOLTAREN ) 75 MG EC tablet Take 1 tablet (75 mg total) by mouth 2 (two) times daily. 06/23/21   Jerri Kay HERO, MD  DULoxetine  (CYMBALTA ) 30 MG capsule Take 1 capsule (30 mg total) by mouth daily. Take with 60mg  for a total of 90mg  daily starting 12/3 12/04/24 01/03/25  Graham Krabbe, MD  DULoxetine  (CYMBALTA ) 60 MG capsule Take 1 capsule (60 mg total) by mouth daily. Take 60mg  with 30mg  of duloxetine . Take a total of 90mg  daily starting 12/3 12/04/24 01/03/25  Chien, Stephanie, MD  gabapentin  (NEURONTIN ) 300 MG capsule Take 1 capsule (300 mg total) by mouth 3 (three) times daily. 06/26/24   Whitfield Raisin, NP  ibuprofen  (ADVIL ) 800 MG tablet Take 800 mg by mouth. 10/18/16   [provider]  Anselm Oil 1000 MG CAPS 1 capsule.    [provider]  loratadine (  CLARITIN) 10 MG tablet Take 10 mg by mouth daily.    [provider]  LORazepam (ATIVAN) 0.5 MG tablet Take 0.5 mg by mouth every 8 (eight) hours.    [provider]  magnesium gluconate (MAGONATE) 500 MG tablet Take 250 mg by mouth daily.    [provider]  medroxyPROGESTERone  (PROVERA ) 10 MG tablet Take 1 tablet (10 mg total) by mouth in the morning and at bedtime. Take to induce menses  if no cycle in 3 months. 10/02/24   Dallie Vera GAILS, MD  norgestimate-ethinyl estradiol (ORTHO-CYCLEN) 0.25-35 MG-MCG tablet Take 1 tablet by mouth daily.    [provider]  ocrelizumab (OCREVUS) 300 MG/10ML injection Inject 600 mg into the vein every 6 (six) months.    Sater, Charlie LABOR, MD  omeprazole (PRILOSEC) 40 MG capsule 2 capsules 30 minutes before morning meal Orally Once a day 01/30/23   [provider]  OZEMPIC, 0.25 OR 0.5 MG/DOSE, 2 MG/3ML SOPN SMARTSIG:0.25 Milligram(s) SUB-Q Once a Week    [provider]  phentermine  30 MG capsule Take 1 capsule (30 mg total) by mouth every morning. 08/02/24   Whitfield Raisin, NP  Prasterone, DHEA, (DHEA PO) Take by mouth.    [provider]  propranolol  (INDERAL ) 10 MG tablet Take 1 tablet (10 mg total) by mouth daily as needed (anxiety, panic attacks, sleep). 10/09/24 01/07/25  Chien, Stephanie, MD  SUMAtriptan  (IMITREX ) 50 MG tablet Take 1 tablet (50 mg total) by mouth as needed for migraine. May repeat in 2 hours if headache persists or recurs. 06/26/24   Whitfield Raisin, NP  topiramate (TOPAMAX) 25 MG tablet 1 tablet Orally Once a day    [provider]  Vitamin D -Vitamin K (VITAMIN K2-VITAMIN D3) 90-125 MCG CAPS Take by mouth.    [provider]     Family History  Problem Relation Age of Onset   Hyperlipidemia Mother    Hyperlipidemia Father    Diabetes Father    Atrial fibrillation Father    Prostate cancer Father    Hemochromatosis Brother    Lung cancer Maternal Grandmother    Diabetes Paternal Grandmother    Cancer Paternal Grandfather        colon   Multiple sclerosis Neg Hx     Social History   Socioeconomic History   Marital status: Single    Spouse name: Not on file   Number of children: Not on file   Years of education: Not on file   Highest education level: Not on file  Occupational History   Not on file  Tobacco Use   Smoking status: Never   Smokeless tobacco:  Never  Vaping Use   Vaping status: Not on file  Substance and Sexual Activity   Alcohol use: No   Drug use: No   Sexual activity: Yes    Birth control/protection: Condom, Other-see comments    Comment: condoms sometimes  Other Topics Concern   Not on file  Social History Narrative   Pt lives alone    Pt doesn't work    Social Drivers of Health   Tobacco Use: Low Risk (09/24/2024)   Patient History    Smoking Tobacco Use: Never    Smokeless Tobacco Use: Never    Passive Exposure: Not on file  Financial Resource Strain: Not on file  Food Insecurity: Not on file  Transportation Needs: Not on file  Physical Activity: Not on file  Stress: Not on file  Social Connections: Not  on file  Depression (EYV7-0): Not on file  Alcohol Screen: Not on file  Housing: Not on file  Utilities: Not on file  Health Literacy: Not on file    Review of Systems: A 12 point ROS discussed and pertinent positives are indicated in the HPI above.  All other systems are negative.   Vital Signs: BP (!) 159/83 (BP Location: Left Arm, Patient Position: Sitting, Cuff Size: Large)   Pulse 77   Temp 97.8 F (36.6 C) (Oral)   Resp 18   LMP 09/24/2024 (Exact Date)   SpO2 98%     Physical Exam Constitutional:      General: She is not in acute distress.    Appearance: She is obese. She is not ill-appearing.  Pulmonary:     Effort: Pulmonary effort is normal.  Abdominal:     Tenderness: There is no abdominal tenderness.  Neurological:     Mental Status: She is alert and oriented to person, place, and time.     Labs:  CBC: Recent Labs    01/03/24 0931 06/26/24 0833  WBC 9.3 9.4  HGB 12.5 12.8  HCT 39.1 40.8  PLT 162 196    COAGS: No results for input(s): INR, APTT in the last 8760 hours.  BMP: No results for input(s): NA, K, CL, CO2, GLUCOSE, BUN, CALCIUM, CREATININE, GFRNONAA, GFRAA in the last 8760 hours.  Invalid input(s): CMP  LIVER FUNCTION  TESTS: No results for input(s): BILITOT, AST, ALT, ALKPHOS, PROT, ALBUMIN in the last 8760 hours.  TUMOR MARKERS: No results for input(s): AFPTM, CEA, CA199, CHROMGRNA in the last 8760 hours.  Assessment and Plan:  49 year old female with a history of menorrhagia secondary to uterine fibroids.   MRI Pelvis showed an enlarged fibroid uterus with a dominant submucosal fibroid measuring almost 6 cm. We reviewed the procedure details of uterine artery embolization and discussed the risks, benefits, alternatives and procedural expectations. She reaffirmed her desire to pursue a treatment that was minimally invasive and with the shortest recovery time.   Plan: Uterine artery embolization with moderate sedation at [facility].   Thank you for this interesting consult.  I greatly enjoyed meeting DRINDA BELGARD and look forward to participating in their care.  A copy of this report was sent to the requesting provider on this date.  Electronically Signed: Warren JONELLE Dais, NP 10/17/2024, 3:57 PM   I spent a total of  40 Minutes   in face to face in clinical consultation, greater than 50% of which was counseling/coordinating care for symptomatic uterine fibroids.  "

## 2024-10-16 NOTE — Progress Notes (Signed)
 " [facility] Counselor/Therapist Progress Note  Patient ID: RHIANNAN KIEVIT, MRN: [id number],    Date: 10/16/2024  Time Spent: 54 minutes  Time in:  9:08 Time out:10:02  Treatment Type: Individual Therapy  Reported Symptoms: sadness, frustration, anxiety  Mental Status Exam: Appearance:  Casual     Behavior: Appropriate  Motor: Normal  Speech/Language:  Normal Rate  Affect: blunted  Mood: pleasant  Thought process: normal  Thought content:   WNL  Sensory/Perceptual disturbances:   WNL  Orientation: oriented to person, place, time/date, and situation  Attention: Good  Concentration: Good  Memory: WNL  Fund of knowledge:  Good  Insight:   Good  Judgment:  Good  Impulse Control: Good   Risk Assessment: Danger to Self:  No Self-injurious Behavior: No Danger to Others: No Duty to Warn:no Physical Aggression / Violence:No  Access to Firearms a concern: No  Gang Involvement:No   Subjective: The patient attended a face-to-face individual therapy session via video today.  The patient gave verbal consent for the video to be on [facility] and is aware of the limitations of telehealth. The patient was alone in her home alone and the therapist was in the office.  The patient reports that she is feeling a little bit anxious today because she is supposed to go see the doctor tomorrow about having surgery for her female issues.  We talked about what she has done since I saw her last and she still does not have an income stream at this point and has not talked to her dad about it.  I encouraged her to look into that because I do not know how she is going to continue to make it if she does not have something coming in and because she is getting ready to have surgery I am not sure that she will be able to get the Medicaid money from  taking care of her grandmother.  The patient seems to be doing okay at present and she does report just feeling a little bit anxious and we talked  about how to manage her anxiety.   Interventions:  Cognitive Behavioral therapy, EMDR, meditation and mindfulness, psychoeducation, Problem solving, interpersonal  Diagnosis:  PTSD Hoarding disorder  Plan of Care: Plan: Client Abilities/Strengths  Intelligent, ability for insight,  Client Treatment Preferences  Outpatient individual therapy  Client Statement of Needs  I went to see a Disability therapist and he suggested I get therapy for my PTSD  Treatment Level  Outpatient Individual therapy  Symptoms  Displays a significant decline in interest and engagement in activities.:  (Status:  improved). Displays significant psychological and/or physiological distress resulting from internal and  external clues that are reminiscent of the traumatic event.:(Status: maintained). Experiences disturbances in sleep.: (Status: improved). Experiences disturbing  and persistent thoughts, images, and/or perceptions of the traumatic event.:  (Status: improved). Experiences frequent nightmares.:  (Status: improved). Has  been exposed to a traumatic event involving actual or perceived threat of death or serious injury.: (Status: maintained). Impairment in social, occupational, or other areas of  functioning.:  (Status: improved). Intentionally avoids thoughts, feelings, or  discussions related to the traumatic event.: (Status: improved). Reports  response of intense fear, helplessness, or horror to the traumatic event.: (Status: improved). Symptoms present more than one month.:(Status: maintained).  Goals 1. Eliminate or reduce the negative impact trauma related symptoms have  on social, occupational, and family functioning. 2. No longer experiences intrusive event recollections, avoidance of event  reminders, intense arousal, or  disinterest in activities or  relationships. Objective Verbalize any symptoms of depression, including any suicidal thoughts. Target Date:04/17/2025 Frequency: weekly to  Biweekly Progress: 60 Modality: individual Objective Participate in Eye Movement Desensitization and Reprocessing (EMDR) to reduce emotional distress  related to traumatic thoughts, feelings, and images. Target Date: 04/17/2025 Frequency: Biweekly Progress: 60 Modality: individual Related Interventions 1. Utilize Eye Movement Desensitization and Reprocessing (EMDR) to reduce the client's  emotional reactivity to the traumatic event and reduce PTSD symptoms. Objective Learn and implement calming skills. Target Date: 04/17/2025 Frequency:weekly to Biweekly Progress: 40 Modality: individual Related Interventions 1. Teach the client calming skills (e.g., breathing retraining, relaxation, calming self-talk) to use in and between sessions when feeling overly distressed. Objective Participate in Cognitive Therapy to help identify, challenge, and replace biased, negative, and selfdefeating thoughts resulting from the trauma. Target Date: 04/17/2025 Frequency: weekly to Biweekly Progress: 30 Modality: individual Related Interventions 1. Using Cognitive Therapy techniques, explore the client's self-talk and beliefs about self, others,  and the future that are a consequence of the trauma (e.g., themes of safety, trust, power, control,  esteem, and intimacy); identify and challenge biases; assist him/her in generating appraisals that correct for the biases; test biased and alternatives predictions through behavioral experiments. Objective Learn and implement guided self-dialogue to manage thoughts, feelings, and urges brought on by  encounters with trauma-related situations. Target Date: 04/17/2025 Frequency: weekly to Biweekly Progress: 50 Modality: individual Related Interventions 1. Teach the client a guided self-dialogue procedure in which he/she learns to recognize  maladaptive self-talk, challenges its biases, copes with engendered feelings, overcomes  avoidance, and reinforces his/her  accomplishments; review and reinforce progress, problemsolve obstacles. Objective Learn and implement approaches for addressing shame and self-disparagement. Target Date: 04/17/2025 Frequency: weekly to Biweekly Progress: 0 Modality: individual Diagnosis F43.10 (Posttraumatic stress disorder) - Open -signifier: n/a posttraumatic Stress  Disorder  Panic Disorder Conditions For Discharge Achievement of treatment goals and objectives Patient is scheduled for her next session within the next two weeks. The patient approved this treatment plan.     Dalvin Clipper G Hardin Hardenbrook, LCSW                                                                                                      "

## 2024-10-17 ENCOUNTER — Inpatient Hospital Stay: Admission: RE | Admit: 2024-10-17

## 2024-10-17 ENCOUNTER — Telehealth: Payer: Self-pay

## 2024-10-17 ENCOUNTER — Ambulatory Visit
Admission: RE | Admit: 2024-10-17 | Discharge: 2024-10-17 | Disposition: A | Discharge status: Home / Self Care | Source: Ambulatory Visit | Attending: Obstetrics and Gynecology | Admitting: Obstetrics and Gynecology

## 2024-10-17 ENCOUNTER — Other Ambulatory Visit

## 2024-10-17 DIAGNOSIS — D259 Leiomyoma of uterus, unspecified: Secondary | ICD-10-CM

## 2024-10-18 ENCOUNTER — Other Ambulatory Visit: Payer: Self-pay | Admitting: Interventional Radiology

## 2024-10-18 DIAGNOSIS — D259 Leiomyoma of uterus, unspecified: Secondary | ICD-10-CM

## 2024-10-29 ENCOUNTER — Inpatient Hospital Stay: Admission: RE | Admit: 2024-10-29

## 2024-10-30 ENCOUNTER — Ambulatory Visit: Admitting: Psychology

## 2024-11-13 ENCOUNTER — Ambulatory Visit (HOSPITAL_COMMUNITY): Admitting: Clinical

## 2024-12-11 ENCOUNTER — Ambulatory Visit (HOSPITAL_COMMUNITY): Admitting: Psychiatry

## 2025-01-08 ENCOUNTER — Ambulatory Visit: Admitting: Adult Health
